# Patient Record
Sex: Female | Born: 1955
Health system: Southern US, Community
[De-identification: ages and names within clinical notes are randomized; demographics above are authoritative.]

## PROBLEM LIST (undated history)

## (undated) DIAGNOSIS — E119 Type 2 diabetes mellitus without complications: Secondary | ICD-10-CM

## (undated) DIAGNOSIS — I10 Essential (primary) hypertension: Secondary | ICD-10-CM

## (undated) DIAGNOSIS — E785 Hyperlipidemia, unspecified: Secondary | ICD-10-CM

## (undated) HISTORY — DX: Type 2 diabetes mellitus without complications: E11.9

## (undated) HISTORY — PX: OTHER SURGICAL HISTORY: SHX169

## (undated) HISTORY — DX: Hyperlipidemia, unspecified: E78.5

## (undated) HISTORY — PX: AUGMENTATION MAMMAPLASTY: SUR837

## (undated) HISTORY — DX: Essential (primary) hypertension: I10

## (undated) HISTORY — PX: BREAST ENHANCEMENT SURGERY: SHX7

## (undated) HISTORY — PX: TONSILECTOMY/ADENOIDECTOMY WITH MYRINGOTOMY: SHX6125

## (undated) HISTORY — PX: TUBAL LIGATION: SHX77

---

## 2011-11-30 LAB — BASIC METABOLIC PANEL
Creatinine: 0.7 mg/dL (ref <=1.1)
Potassium: 4.2 mmol/L (ref 3.4–5.3)
SODIUM: 138 mmol/L (ref 137–147)

## 2012-02-11 LAB — LIPID PANEL
CHOLESTEROL: 198 mg/dL (ref 0–200)
HDL: 67 mg/dL (ref 35–70)
LDL Cholesterol: 118 mg/dL
TRIGLYCERIDES: 67 mg/dL (ref 40–160)

## 2012-02-11 LAB — BASIC METABOLIC PANEL: Glucose: 112 mg/dL

## 2013-03-28 ENCOUNTER — Ambulatory Visit (INDEPENDENT_AMBULATORY_CARE_PROVIDER_SITE_OTHER): Payer: 59 | Admitting: Physician Assistant

## 2013-03-28 ENCOUNTER — Encounter: Payer: Self-pay | Admitting: Physician Assistant

## 2013-03-28 VITALS — BP 125/67 | HR 63 | Wt 169.0 lb

## 2013-03-28 DIAGNOSIS — R209 Unspecified disturbances of skin sensation: Secondary | ICD-10-CM

## 2013-03-28 DIAGNOSIS — E785 Hyperlipidemia, unspecified: Secondary | ICD-10-CM | POA: Insufficient documentation

## 2013-03-28 DIAGNOSIS — R238 Other skin changes: Secondary | ICD-10-CM

## 2013-03-28 DIAGNOSIS — E119 Type 2 diabetes mellitus without complications: Secondary | ICD-10-CM | POA: Insufficient documentation

## 2013-03-28 DIAGNOSIS — Z23 Encounter for immunization: Secondary | ICD-10-CM

## 2013-03-28 DIAGNOSIS — K449 Diaphragmatic hernia without obstruction or gangrene: Secondary | ICD-10-CM

## 2013-03-28 DIAGNOSIS — I10 Essential (primary) hypertension: Secondary | ICD-10-CM | POA: Insufficient documentation

## 2013-03-28 LAB — POCT UA - MICROALBUMIN
Creatinine, POC: 200 mg/dL
Microalbumin Ur, POC: 10 mg/L

## 2013-03-28 LAB — POCT GLYCOSYLATED HEMOGLOBIN (HGB A1C): Hemoglobin A1C: 5.6

## 2013-03-28 MED ORDER — OMEPRAZOLE 40 MG PO CPDR: 40.0000 mg | DELAYED_RELEASE_CAPSULE | Freq: Every day | ORAL | Status: DC

## 2013-03-28 MED ORDER — ATENOLOL-CHLORTHALIDONE 100-25 MG PO TABS: 1.0000 | ORAL_TABLET | Freq: Every day | ORAL | Status: DC

## 2013-03-28 MED ORDER — SITAGLIPTIN PHOSPHATE 50 MG PO TABS: 50.0000 mg | ORAL_TABLET | Freq: Every day | ORAL | Status: DC

## 2013-03-28 MED ORDER — METFORMIN HCL 1000 MG PO TABS: 1000.0000 mg | ORAL_TABLET | Freq: Two times a day (BID) | ORAL | Status: DC

## 2013-03-28 MED ORDER — PRAVASTATIN SODIUM 20 MG PO TABS: 20.0000 mg | ORAL_TABLET | Freq: Every day | ORAL | Status: DC

## 2013-03-28 NOTE — Patient Instructions (Addendum)
Will decrease Januvia to 50mg  daily.  Will refer to Gastroenterology.  Will refer to Opthamology.   Follow up in 3 months.   Raynaud's Syndrome Raynaud's Syndrome is a disorder of the blood vessels in your hands and feet. It occurs when small arteries of the arms/hands or legs/feet become sensitive to cold or emotional upset. This causes the arteries to constrict, or narrow, and reduces blood flow to the area. The color in the fingers or toes changes from white to bluish to red and this is not usually painful. There may be numbness and tingling. Sores on the skin (ulcers) can form. Symptoms are usually relieved by warming. HOME CARE INSTRUCTIONS   Avoid exposure to cold. Keep your whole body warm and dry. Dress in layers. Wear mittens or gloves when handling ice or frozen food and when outdoors. Use holders for glasses or cans containing cold drinks. If possible, stay indoors during cold weather.  Limit your use of caffeine. Switch to decaffeinated coffee, tea, and soda pop. Avoid chocolate.  Avoid smoking or being around cigarette smoke. Smoke will make symptoms worse.  Wear loose fitting socks and comfortable, roomy shoes.  Avoid vibrating tools and machinery.  If possible, avoid stressful and emotional situations. Exercise, meditation and yoga may help you cope with stress. Biofeedback may be useful.  Ask your caregiver about medicine (calcium channel blockers) that may control Raynaud's phenomena. SEEK MEDICAL CARE IF:   Your discomfort becomes worse, despite conservative treatment.  You develop sores on your fingers and toes that do not heal. Document Released: 03/05/2000 Document Revised: 05/31/2011 Document Reviewed: 03/12/2008 [facility] Patient Information 2014 [place], [place].

## 2013-03-28 NOTE — Progress Notes (Signed)
   Subjective:    Patient ID: Diane Brooks, female    DOB: 01-Oct-1955, 58 y.o.   MRN: [id number]  HPI Pt is a 58 yo female who presents to the clinic to establish care. Moved from [place].    .. Active Ambulatory Problems    Diagnosis Date Noted  . Hyperlipidemia 03/28/2013  . Hiatal hernia 03/28/2013  . Essential hypertension, benign 03/28/2013  . Diabetes mellitus type II, controlled 03/28/2013   Resolved Ambulatory Problems    Diagnosis Date Noted  . No Resolved Ambulatory Problems   Past Medical History  Diagnosis Date  . Diabetes mellitus without complication   . Hypertension    DM- taking metformin and januvia. Checking fasting glucose in am and between 95-120. Denies any hypoglycemia events. No problems or concerns.  HTN- taking medications regularly. No problems.   Cold feet- going on for 1-2 months. Feet get cold all of a sudden. They eventually resolve on there on. From [place] and used to cold weather. Pain is very sharp when get cold. Nothing seems to make better or worse.   Hiatal hernia- has regular endoscopys back in [place]. Would like GI to manage. Symptoms controlled with prilosec.    Review of Systems  All other systems reviewed and are negative.       Objective:   Physical Exam  Constitutional: She is oriented to person, place, and time. She appears well-developed and well-nourished.  HENT:  Head: Normocephalic and atraumatic.  Neck: Normal range of motion. Neck supple. No thyromegaly present.  Cardiovascular: Normal rate, regular rhythm and normal heart sounds.   Pulmonary/Chest: Effort normal and breath sounds normal. She has no wheezes.  Neurological: She is alert and oriented to person, place, and time.  Skin: Skin is warm and dry.  Psychiatric: She has a normal mood and affect. Her behavior is normal.          Assessment & Plan:  Diabetes mellitus type II-  A1C is 5.6. mircoalbumin was checked and normal. Discussed stopping [place]. Pt is not ready  to stop Tong[place] all together because she feels like it was what helped the most. Decreased januvia 50mg  daily with metformin BID. Continue regular exercise and maintain carbs/sugars with moderation. Discussed needs to be on ACE inhibitor. Not ready to add another medication. Will discuss at next visit. Referred to have eye doc appt. Will recheck in 3 months.   Diabetic foot exam:  Left: Reflexes 2+   Vibratory sensation normal  Proprioception normal  Sharp/dull discrimination normal  Filament test present Right: Reflexes 2+   Vibratory sensation normal  Proprioception diminished  Sharp/dull discrimination diminished  Filament test present   Hyperlipidemia- will recheck lipid today. Will refill according to levels.   Hiatal hernia/GERD- refilled prilosec. Will send to gastroenterology for further management of hiatal hernia.    HTN- refilled BP medications today. Will continue to monitor.   Cold feet- discussed possibility of raynauds. Not ready to start medication today. Gave HO for review. Discussed importance of keeping feet warm. Will continue to monitor.   Depression screening negative.   Needs CPE for evaluation and to go over health maintence.    Flu shot given without complications.

## 2013-03-29 LAB — CBC WITH DIFFERENTIAL/PLATELET
Basophils Absolute: 0 10*3/uL (ref 0.0–0.1)
Basophils Relative: 0 % (ref 0–1)
EOS ABS: 0.1 10*3/uL (ref 0.0–0.7)
EOS PCT: 2 % (ref 0–5)
HEMATOCRIT: 40.4 % (ref 36.0–46.0)
Hemoglobin: 14 g/dL (ref 12.0–15.0)
LYMPHS PCT: 47 % — AB (ref 12–46)
Lymphs Abs: 2.5 10*3/uL (ref 0.7–4.0)
MCH: 30.8 pg (ref 26.0–34.0)
MCHC: 34.7 g/dL (ref 30.0–36.0)
MCV: 89 fL (ref 78.0–100.0)
MONO ABS: 0.3 10*3/uL (ref 0.1–1.0)
Monocytes Relative: 6 % (ref 3–12)
Neutro Abs: 2.4 10*3/uL (ref 1.7–7.7)
Neutrophils Relative %: 45 % (ref 43–77)
PLATELETS: 248 10*3/uL (ref 150–400)
RBC: 4.54 MIL/uL (ref 3.87–5.11)
RDW: 14.6 % (ref 11.5–15.5)
WBC: 5.3 10*3/uL (ref 4.0–10.5)

## 2013-03-29 LAB — COMPLETE METABOLIC PANEL WITH GFR
ALBUMIN: 4.7 g/dL (ref 3.5–5.2)
ALK PHOS: 44 U/L (ref 39–117)
ALT: 18 U/L (ref 0–35)
AST: 22 U/L (ref 0–37)
BUN: 18 mg/dL (ref 6–23)
CALCIUM: 9.7 mg/dL (ref 8.4–10.5)
CO2: 33 mEq/L — ABNORMAL HIGH (ref 19–32)
Chloride: 100 mEq/L (ref 96–112)
Creat: 0.65 mg/dL (ref 0.50–1.10)
GFR, Est African American: >89 mL/min
GFR, Est Non African American: >89 mL/min
Glucose, Bld: 105 mg/dL — ABNORMAL HIGH (ref 70–99)
POTASSIUM: 4.1 meq/L (ref 3.5–5.3)
Sodium: 138 mEq/L (ref 135–145)
Total Bilirubin: 0.5 mg/dL (ref 0.3–1.2)
Total Protein: 7.2 g/dL (ref 6.0–8.3)

## 2013-03-29 LAB — LIPID PANEL
CHOL/HDL RATIO: 2.9 ratio
Cholesterol: 199 mg/dL (ref 0–200)
HDL: 68 mg/dL (ref >39)
LDL Cholesterol: 111 mg/dL — ABNORMAL HIGH (ref 0–99)
Triglycerides: 100 mg/dL (ref <150)
VLDL: 20 mg/dL (ref 0–40)

## 2013-04-13 ENCOUNTER — Encounter: Payer: Self-pay | Admitting: Internal Medicine

## 2013-04-16 ENCOUNTER — Other Ambulatory Visit: Payer: Self-pay | Admitting: Physician Assistant

## 2013-04-16 DIAGNOSIS — Z124 Encounter for screening for malignant neoplasm of cervix: Secondary | ICD-10-CM

## 2013-04-23 ENCOUNTER — Encounter: Payer: Self-pay | Admitting: *Deleted

## 2013-04-23 ENCOUNTER — Other Ambulatory Visit: Payer: Self-pay | Admitting: *Deleted

## 2013-04-23 MED ORDER — ASPIRIN 81 MG PO TABS: 81.0000 mg | ORAL_TABLET | Freq: Every day | ORAL | Status: DC

## 2013-04-23 MED ORDER — ATENOLOL-CHLORTHALIDONE 100-25 MG PO TABS: 1.0000 | ORAL_TABLET | Freq: Every day | ORAL | Status: DC

## 2013-04-23 MED ORDER — SITAGLIPTIN PHOSPHATE 50 MG PO TABS: 50.0000 mg | ORAL_TABLET | Freq: Every day | ORAL | Status: DC

## 2013-04-23 MED ORDER — PRAVASTATIN SODIUM 20 MG PO TABS: 20.0000 mg | ORAL_TABLET | Freq: Every day | ORAL | Status: DC

## 2013-04-23 MED ORDER — METFORMIN HCL 1000 MG PO TABS: 1000.0000 mg | ORAL_TABLET | Freq: Two times a day (BID) | ORAL | Status: DC

## 2013-04-23 MED ORDER — OMEPRAZOLE 40 MG PO CPDR: 40.0000 mg | DELAYED_RELEASE_CAPSULE | Freq: Every day | ORAL | Status: DC

## 2013-04-25 ENCOUNTER — Telehealth: Payer: Self-pay | Admitting: Physician Assistant

## 2013-04-25 NOTE — Telephone Encounter (Signed)
Call pt: received a letter from insurance company that stated they did not want to pay for Tonga. A1C is 5.6 which is great. I think a trial off medication is warranted at this time. If we see that A1C is climbing back up we will have better case for approval.

## 2013-04-26 NOTE — Telephone Encounter (Signed)
Spoke with pt & she is ok with doing a trial period without the Januvia.  I advised her to continue to keep a log of her blood sugars at home that she takes every other day & to let us know if they start trending above 120 & stay there.  Also advised her to follow up in 3 months to recheck her A1c off of the Januvia.  Pt voiced understanding.

## 2013-04-27 ENCOUNTER — Other Ambulatory Visit: Payer: Self-pay | Admitting: *Deleted

## 2013-04-27 MED ORDER — SITAGLIPTIN PHOSPHATE 50 MG PO TABS: 50.0000 mg | ORAL_TABLET | Freq: Every day | ORAL | Status: DC

## 2013-05-01 ENCOUNTER — Other Ambulatory Visit: Payer: Self-pay | Admitting: *Deleted

## 2013-05-01 MED ORDER — PRAVASTATIN SODIUM 40 MG PO TABS: 40.0000 mg | ORAL_TABLET | Freq: Every day | ORAL | Status: DC

## 2013-05-07 ENCOUNTER — Encounter: Payer: Self-pay | Admitting: Internal Medicine

## 2013-05-10 ENCOUNTER — Encounter: Payer: Self-pay | Admitting: Internal Medicine

## 2013-05-10 ENCOUNTER — Ambulatory Visit (INDEPENDENT_AMBULATORY_CARE_PROVIDER_SITE_OTHER): Payer: 59 | Admitting: Internal Medicine

## 2013-05-10 ENCOUNTER — Ambulatory Visit (INDEPENDENT_AMBULATORY_CARE_PROVIDER_SITE_OTHER): Payer: 59 | Admitting: Obstetrics & Gynecology

## 2013-05-10 ENCOUNTER — Encounter: Payer: Self-pay | Admitting: Obstetrics & Gynecology

## 2013-05-10 VITALS — BP 120/70 | HR 68 | Ht 62.5 in | Wt 173.4 lb

## 2013-05-10 VITALS — BP 121/65 | HR 56 | Resp 16 | Wt 171.0 lb

## 2013-05-10 DIAGNOSIS — R6882 Decreased libido: Secondary | ICD-10-CM

## 2013-05-10 DIAGNOSIS — Z01419 Encounter for gynecological examination (general) (routine) without abnormal findings: Secondary | ICD-10-CM

## 2013-05-10 DIAGNOSIS — F458 Other somatoform disorders: Secondary | ICD-10-CM

## 2013-05-10 DIAGNOSIS — K449 Diaphragmatic hernia without obstruction or gangrene: Secondary | ICD-10-CM

## 2013-05-10 DIAGNOSIS — Z1211 Encounter for screening for malignant neoplasm of colon: Secondary | ICD-10-CM

## 2013-05-10 DIAGNOSIS — R09A2 Foreign body sensation, throat: Secondary | ICD-10-CM

## 2013-05-10 DIAGNOSIS — K219 Gastro-esophageal reflux disease without esophagitis: Secondary | ICD-10-CM

## 2013-05-10 DIAGNOSIS — R0989 Other specified symptoms and signs involving the circulatory and respiratory systems: Secondary | ICD-10-CM | POA: Insufficient documentation

## 2013-05-10 DIAGNOSIS — Z1151 Encounter for screening for human papillomavirus (HPV): Secondary | ICD-10-CM

## 2013-05-10 DIAGNOSIS — R0789 Other chest pain: Secondary | ICD-10-CM

## 2013-05-10 DIAGNOSIS — K648 Other hemorrhoids: Secondary | ICD-10-CM | POA: Insufficient documentation

## 2013-05-10 DIAGNOSIS — Z124 Encounter for screening for malignant neoplasm of cervix: Secondary | ICD-10-CM

## 2013-05-10 MED ORDER — DEXLANSOPRAZOLE 60 MG PO CPDR: 60.0000 mg | DELAYED_RELEASE_CAPSULE | Freq: Every day | ORAL | Status: DC

## 2013-05-10 NOTE — Progress Notes (Signed)
  Subjective:    Diane Brooks is a 58 y.o. female who presents for an annual exam. The patient has no complaints today. The patient is sexually active. GYN screening history: last pap: was normal and last mammogram: was normal. The patient wears seatbelts: yes. The patient participates in regular exercise: yes. Has the patient ever been transfused or tattooed?: yes. The patient reports that there is not domestic violence in her life.  Her husband and her are not as close as they used to be.  She was the romantic one and when she stopped trying, things became worse.  He does not tell her "I love you".  They did not speak to each other over Valentines day (she was in [place])  Menstrual History: OB History   Grav Para Term Preterm Abortions TAB SAB Ect Mult Living   3 2   1  1   2        No LMP recorded. Patient is postmenopausal.    The following portions of the patient's history were reviewed and updated as appropriate: allergies, current medications, past family history, past medical history, past social history, past surgical history and problem list.  Review of Systems decreased libido, vaginal drynes    Objective:      Filed Vitals:   05/10/13 1344  BP: 121/65  Pulse: 56  Resp: 16  Weight: 171 lb (77.565 kg)   Vitals:  WNL General appearance: alert, cooperative and no distress Head: Normocephalic, without obvious abnormality, atraumatic Eyes: negative Throat: lips, mucosa, and tongue normal; teeth and gums normal Lungs: clear to auscultation bilaterally Breasts: s/p breast reduction.  Thre is a piece of sure visible under the skin.  It does not bother the patient. Heart: regular rate and rhythm Abdomen: soft, non-tender; bowel sounds normal; no masses,  no organomegaly  Pelvic:  External Genitalia:  Tanner V, no lesion Urethra Vagina:  Pale pink, atrohpic, no blood or discharge Cervix:  No CMT, no lesion Uterus:  Slightly enlarge with fibroids felt.  Pt knows she has  fibroids. Adnexa:  Normal, no masses, non tender Rectovaginal Septum:  Non tender, no masses  Extremities: no edema, redness or tenderness in the calves or thighs Skin: no lesions or rash Lymph nodes: Axillary adenopathy: none     .    Assessment:     Healthy female exam.  Decreased libido sue to relationship    Plan:     Breast self exam technique reviewed and patient encouraged to perform self-exam monthly. Mammogram. Pap smear.  Recommend marital counseling.

## 2013-05-10 NOTE — Patient Instructions (Signed)
We have sent the following medications to your pharmacy for you to pick up at your convenience: Garrison. We have given you samples to get started taking one tablet by mouth once daily.   Your follow up appt is scheduled with Dr. Hilarie Fredrickson on 06/13/13 at 9:00am.

## 2013-05-10 NOTE — Progress Notes (Addendum)
Patient ID: Diane Brooks, female   DOB: [birth year], 58 y.o.   MRN: [id number] HPI: [name] is a 58 year old female with a past medical history of GERD, hiatal hernia, internal hemorrhoids, hypertension, hyperlipidemia, and diabetes who is seen in consultation at the request of Micheal Likens, PA-C to evaluate her history of GERD and hiatal hernia. She is here today alone. She reports she has along history of GERD and atypical chest pain. This has been evaluated previously in [place], where she was living. She reports multiple previous upper endoscopies and also cardiology evaluation including stress testing. Her cardiology evaluation was reportedly normal. She is maintained on omeprazole 40 mg daily. She reports that she has a sensation of something stuck in her throat which worsens with stress. This has been going on for months to years. Occasionally she feels a "hot sensation" in her nose and throat. For the most part her heartburn is controlled but she does feel regurgitation and reports frequent throat clearing. She denies dysphagia or odynophagia. She does at times feel chest pressure which comes and goes. This is not exertional. No associated dyspnea and the pain does not radiate. No nausea or vomiting. Bowel solid does increase her regurgitation. No abdominal pain. Normal bowel movements. Stable weight. No blood in her stool or melena. She's had 3 or 4 previous colonoscopies the last in December 2013. No family history of GI malignancy  Past Medical History  Diagnosis Date  . Diabetes mellitus without complication   . Hypertension   . Hyperlipidemia     Past Surgical History  Procedure Laterality Date  . Tonsilectomy/adenoidectomy with myringotomy    . Breast enhancement surgery    . Tubal ligation    . Tummy tuck      Current Outpatient Prescriptions  Medication Sig Dispense Refill  . aspirin 81 MG tablet Take 1 tablet (81 mg total) by mouth daily.  30 tablet  3  .  atenolol-chlorthalidone (TENORETIC) 100-25 MG per tablet Take 1 tablet by mouth daily.  30 tablet  5  . metFORMIN (GLUCOPHAGE) 1000 MG tablet Take 1 tablet (1,000 mg total) by mouth 2 (two) times daily with a meal.  60 tablet  2  . pravastatin (PRAVACHOL) 40 MG tablet Take 1 tablet (40 mg total) by mouth daily.  30 tablet  1  . sitaGLIPtin (JANUVIA) 50 MG tablet Take 1 tablet (50 mg total) by mouth daily.  90 tablet  2  . dexlansoprazole (DEXILANT) 60 MG capsule Take 1 capsule (60 mg total) by mouth daily.  30 capsule  11   No current facility-administered medications for this visit.    No Known Allergies  Family History  Problem Relation Age of Onset  . Hyperlipidemia Mother   . Diabetes Father   . Hyperlipidemia Father   . Hypertension Father   . Diabetes Maternal Grandfather   . Hyperlipidemia Maternal Grandfather   . Diabetes Paternal Grandfather   . Hyperlipidemia Paternal Grandfather     History  Substance Use Topics  . Smoking status: Former Research scientist (life sciences)  . Smokeless tobacco: Never Used  . Alcohol Use: No    ROS: As per history of present illness, otherwise negative  BP 120/70  Pulse 68  Ht 5' 2.5" (1.588 m)  Wt 173 lb 6.4 oz (78.654 kg)  BMI 31.19 kg/m2 Constitutional: Well-developed and well-nourished. No distress. HEENT: Normocephalic and atraumatic. Oropharynx is clear and moist. No oropharyngeal exudate. Conjunctivae are normal.  No scleral icterus. Neck: Neck supple. Trachea midline. Cardiovascular:  Normal rate, regular rhythm and intact distal pulses. No M/R/G Pulmonary/chest: Effort normal and breath sounds normal. No wheezing, rales or rhonchi. Abdominal: Soft, nontender, nondistended. Bowel sounds active throughout.  Extremities: no clubbing, cyanosis, or edema Lymphadenopathy: No cervical adenopathy noted. Neurological: Alert and oriented to person place and time. Skin: Skin is warm and dry. No rashes noted. Psychiatric: Normal mood and affect. Behavior is  normal.  RELEVANT LABS AND IMAGING: CBC    Component Value Date/Time   WBC 5.3 03/28/2013 1139   RBC 4.54 03/28/2013 1139   HGB 14.0 03/28/2013 1139   HCT 40.4 03/28/2013 1139   PLT 248 03/28/2013 1139   MCV 89.0 03/28/2013 1139   MCH 30.8 03/28/2013 1139   MCHC 34.7 03/28/2013 1139   RDW 14.6 03/28/2013 1139   LYMPHSABS 2.5 03/28/2013 1139   MONOABS 0.3 03/28/2013 1139   EOSABS 0.1 03/28/2013 1139   BASOSABS 0.0 03/28/2013 1139    CMP     Component Value Date/Time   NA 138 03/28/2013 1139   K 4.1 03/28/2013 1139   CL 100 03/28/2013 1139   CO2 33* 03/28/2013 1139   GLUCOSE 105* 03/28/2013 1139   BUN 18 03/28/2013 1139   CREATININE 0.65 03/28/2013 1139   CALCIUM 9.7 03/28/2013 1139   PROT 7.2 03/28/2013 1139   ALBUMIN 4.7 03/28/2013 1139   AST 22 03/28/2013 1139   ALT 18 03/28/2013 1139   ALKPHOS 44 03/28/2013 1139   BILITOT 0.5 03/28/2013 1139   Records reviewed from previous GI doctor, Dr. Kern Alberta in for [facility], [place] EGD November 2011 -- hiatal hernia and mild gastritis EGD August 2010 --hiatal hernia and mild gastritis EGD October 2009 -- hiatal hernia, irregular Z line biopsied, gastritis  ---- Pathology records from the above no evidence for Barrett's, no evidence for metaplasia or H. Pylori  Colonoscopy November 2011 -- internal hemorrhoids Colonoscopy August 2010 -- internal hemorrhoids, poor prep on the right Colonoscopy October 2010 -- internal hemorrhoids   ASSESSMENT/PLAN: 57 year old female with a past medical history of GERD, hiatal hernia, internal hemorrhoids, hypertension, hyperlipidemia, and diabetes who is seen in consultation at the request of Micheal Likens, PA-C to evaluate her history of GERD and hiatal hernia.  1.  GERD with globus and atypical chest pain -- the differential for her current symptoms includes uncontrolled acid reflux, esophageal spasm or hypertensive contraction, or symptomatic hiatal hernia. She's had multiple upper endoscopies in the past which have revealed mild gastritis  without dysplasia, metaplasia or H. pylori and a "large" hiatal hernia. He has not had evidence of Barrett's esophagus. I do not feel that upper endoscopy would benefit her in any way at this time. I will switch her from omeprazole to Dexilant 60 mg daily to try to better control reflux. This may lead to improvement in her atypical chest pain/possible spasm and globus sensation. I will see her back in 4-6 weeks for followup. If symptoms continue my next recommendation would be for manometry. We discussed the plan and she understands and agrees. I recommended that she continue her typical GERD hygiene including not eating or drinking within 90 minutes of lying down, avoiding spicy foods and also trigger foods such as caffeine and alcohol.  2.  CRC screening -- she has had multiple previous colonoscopies. She is average risk from a CRC standpoint and has no family history of colon cancer. She has a history of internal hemorrhoids. I have requested records from her most recent colonoscopy in December 2013. If  this exam is similar to previous exams, meaning no polyps removed, she would be due repeat colonoscopy in December 2023.   Addendum: Records received from GI physician, Dr. Kern Alberta, [facility], [place] --Colonoscopy and upper endoscopy performed on 01/24/2010 --EGD hiatal hernia, mild gastritis, biopsies. Pathology = esophageal mucosa with mild esophagitis consistent with reflux esophagitis. No evidence of intestinal metaplasia or Barrett's esophagus. Antral biopsies nonspecific chronic inflammation. No evidence of metaplasia or active gastritis. Negative for H. Pylori. --Colonoscopy Exam to the cecum, internal hemorrhoids otherwise normal  Based on these findings would recommend repeat colonoscopy Nov 2021

## 2013-05-10 NOTE — Progress Notes (Deleted)
VULVAR BIOPSY NOTE  The indications for vulvar biopsy (rule out neoplasia, establish lichen sclerosus diagnosis) were reviewed.   Risks of the biopsy including pain, bleeding, infection, inadequate specimen, scarring and need for additional procedures  were discussed. The patient stated understanding and agreed to undergo procedure today. Consent was signed,  time out performed.  The patient's vulva was prepped with Betadine. 1% lidocaine was injected into the perineum, left side near introitus. A 3-mm punch biopsy was done, biopsy tissue was picked up with sterile forceps and sterile scissors were used to excise the lesion.  Small bleeding was noted and hemostasis was achieved using silver nitrate sticks.  The patient tolerated the procedure well. Post-procedure instructions  (pelvic rest for one week) were given to the patient. The patient is to call with heavy bleeding, fever greater than 100.4, foul smelling vaginal discharge or other concerns. The patient will be return to clinic in two weeks for discussion of results.  Pt looking for cheaper alternative to vagifem.  Will check mail order pharmacy and [facility].  

## 2013-05-16 ENCOUNTER — Telehealth: Payer: Self-pay | Admitting: Internal Medicine

## 2013-05-16 NOTE — Telephone Encounter (Signed)
Received 6 pages from Wilford Sports, MD., sent to Dr. Hilarie Fredrickson on 05/16/13/ss

## 2013-05-24 ENCOUNTER — Encounter: Payer: Self-pay | Admitting: Obstetrics & Gynecology

## 2013-05-24 DIAGNOSIS — N870 Mild cervical dysplasia: Secondary | ICD-10-CM | POA: Insufficient documentation

## 2013-05-24 DIAGNOSIS — B977 Papillomavirus as the cause of diseases classified elsewhere: Secondary | ICD-10-CM | POA: Insufficient documentation

## 2013-05-25 ENCOUNTER — Telehealth: Payer: Self-pay | Admitting: *Deleted

## 2013-05-25 DIAGNOSIS — Z1211 Encounter for screening for malignant neoplasm of colon: Secondary | ICD-10-CM | POA: Insufficient documentation

## 2013-05-25 NOTE — Telephone Encounter (Signed)
Pt notified of normal pap with HR HPV.  Pt states that she has never had an abnormal pap before and she was a virgin when she got married and has been married for 33 years.  Will send some information to pt in the mail.  There is a bit of a language barrier and not sure how much she truly understood.

## 2013-05-25 NOTE — Telephone Encounter (Signed)
Message copied by Asencion Islam on Fri May 25, 2013  9:03 AM ------      Message from: [name]      Created: Thu May 24, 2013  7:06 PM       Pt has HR HPV.  nml cytology.  Pease call pt.  Ask her if she has history of abnml paps.  If not, then co testing in 1 year.  If yes, obtain thorough history and let me know.  Thx Lora! ------

## 2013-06-07 ENCOUNTER — Other Ambulatory Visit: Payer: Self-pay | Admitting: Obstetrics & Gynecology

## 2013-06-07 DIAGNOSIS — Z1231 Encounter for screening mammogram for malignant neoplasm of breast: Secondary | ICD-10-CM

## 2013-06-12 ENCOUNTER — Encounter: Payer: Self-pay | Admitting: Internal Medicine

## 2013-06-13 ENCOUNTER — Encounter: Payer: Self-pay | Admitting: Internal Medicine

## 2013-06-13 ENCOUNTER — Ambulatory Visit (INDEPENDENT_AMBULATORY_CARE_PROVIDER_SITE_OTHER): Payer: 59 | Admitting: Internal Medicine

## 2013-06-13 VITALS — BP 112/68 | HR 68 | Ht 62.5 in | Wt 166.4 lb

## 2013-06-13 DIAGNOSIS — K449 Diaphragmatic hernia without obstruction or gangrene: Secondary | ICD-10-CM

## 2013-06-13 DIAGNOSIS — K219 Gastro-esophageal reflux disease without esophagitis: Secondary | ICD-10-CM

## 2013-06-13 DIAGNOSIS — R0789 Other chest pain: Secondary | ICD-10-CM

## 2013-06-13 MED ORDER — DEXLANSOPRAZOLE 60 MG PO CPDR: 60.0000 mg | DELAYED_RELEASE_CAPSULE | Freq: Every day | ORAL | Status: DC

## 2013-06-13 NOTE — Progress Notes (Signed)
Subjective:    Patient ID: Diane Brooks, female    DOB: Aug 10, 1955, 58 y.o.   MRN: [id number]  HPI Diane Brooks is a 57 year old female with a past medical history of GERD, hiatal hernia, internal hemorrhoids, hypertension, hyperlipidemia, and diabetes who is seen in followup. She is here alone today. Unfortunately she cannot afford the Dexilant consultation answered to 4 days of samples and then ran out. She doesn't feel like she can determine with a short period whether or not she had improvement in her symptoms, which I agree with. She reports she still feels hot sensation in the back of her throat. She's had some heartburn since changing PPI and going some days without. She does feel mid back pain with her GI symptoms. Not exertional. No associated dyspnea. No abdominal pain. Normal bowel movements. Stable weight. No blood in her stool or melena.  She asked about the possibility of surgical correction if this is symptomatic hiatal hernia.  Review of Systems As per HPI, otherwise negative  Current Medications, Allergies, Past Medical History, Past Surgical History, Family History and Social History were reviewed in [employer] record.     Objective:   Physical Exam BP 112/68  Pulse 68  Ht 5' 2.5" (1.588 m)  Wt 166 lb 6.4 oz (75.479 kg)  BMI 29.93 kg/m2 Constitutional: Well-developed and well-nourished. No distress. HEENT: Normocephalic and atraumatic. No scleral icterus. Neck: Neck supple. Trachea midline. Cardiovascular: Normal rate, regular rhythm and intact distal pulses. No M/R/G Pulmonary/chest: Effort normal and breath sounds normal. No wheezing, rales or rhonchi. Abdominal: Soft, nontender, nondistended. Bowel sounds active throughout.  Extremities: no clubbing, cyanosis, or edema Neurological: Alert and oriented to person place and time. Psychiatric: Normal mood and affect. Behavior is normal.  Records received from GI physician, Dr. Kern Alberta, [facility], [place]  --Colonoscopy and upper endoscopy performed on 01/24/2010  --EGD hiatal hernia, mild gastritis, biopsies. Pathology = esophageal mucosa with mild esophagitis consistent with reflux esophagitis. No evidence of intestinal metaplasia or Barrett's esophagus. Antral biopsies nonspecific chronic inflammation. No evidence of metaplasia or active gastritis. Negative for H. Pylori.  --Colonoscopy Exam to the cecum, internal hemorrhoids otherwise normal  CBC    Component Value Date/Time   WBC 5.3 03/28/2013 1139   RBC 4.54 03/28/2013 1139   HGB 14.0 03/28/2013 1139   HCT 40.4 03/28/2013 1139   PLT 248 03/28/2013 1139   MCV 89.0 03/28/2013 1139   MCH 30.8 03/28/2013 1139   MCHC 34.7 03/28/2013 1139   RDW 14.6 03/28/2013 1139   LYMPHSABS 2.5 03/28/2013 1139   MONOABS 0.3 03/28/2013 1139   EOSABS 0.1 03/28/2013 1139   BASOSABS 0.0 03/28/2013 1139    CMP     Component Value Date/Time   NA 138 03/28/2013 1139   K 4.1 03/28/2013 1139   CL 100 03/28/2013 1139   CO2 33* 03/28/2013 1139   GLUCOSE 105* 03/28/2013 1139   BUN 18 03/28/2013 1139   CREATININE 0.65 03/28/2013 1139   CALCIUM 9.7 03/28/2013 1139   PROT 7.2 03/28/2013 1139   ALBUMIN 4.7 03/28/2013 1139   AST 22 03/28/2013 1139   ALT 18 03/28/2013 1139   ALKPHOS 44 03/28/2013 1139   BILITOT 0.5 03/28/2013 1139       Assessment & Plan:   58 year old female with a past medical history of GERD, hiatal hernia, internal hemorrhoids, hypertension, hyperlipidemia, and diabetes who is seen in followup.  1. GERD with globus and atypical CP -- unfortunately we  do not know if a more potent PPI, Dexilant, would improve percent times because the medication was too expensive. She petition her insurance company and they approved a one-month supply. She will start this soon. In the meantime I recommended proceeding to barium swallow with tablet to further evaluate motility and also assess the size of her hiatal hernia. She has had previous endoscopy with negative biopsies for  eosinophilic esophagitis and no evidence for Barrett's esophagus. Given the stability of her symptoms upper endoscopy is felt unlikely to provide much additional benefit. I have recommended proceeding to esophageal manometry to ensure motility is normal, especially if she proceeds to Nissen fundoplication. I will see her back after the adequate trial of Dexilant and the 2 previously mentioned studies. She is happy with this plan  2.  CRC screening -- she is up-to-date and repeat colonoscopy recommended Nov 2021 for screening

## 2013-06-13 NOTE — Patient Instructions (Signed)
We have sent the following medications to your pharmacy for you to pick up at your convenience: [place] have been scheduled for a Barium Esophogram at [facility]iology (1st floor of the hospital) on 06/19/2013 at 9:30am. Please arrive 15 minutes prior to your appointment for registration. Make certain not to have anything to eat or drink 6 hours prior to your test. If you need to reschedule for any reason, please contact radiology at [phone] to do so. __________________________________________________________________ A barium swallow is an examination that concentrates on views of the esophagus. This tends to be a double contrast exam (barium and two liquids which, when combined, create a gas to distend the wall of the oesophagus) or single contrast (non-ionic iodine based). The study is usually tailored to your symptoms so a good history is essential. Attention is paid during the study to the form, structure and configuration of the esophagus, looking for functional disorders (such as aspiration, dysphagia, achalasia, motility and reflux) EXAMINATION You may be asked to change into a gown, depending on the type of swallow being performed. A radiologist and radiographer will perform the procedure. The radiologist will advise you of the type of contrast selected for your procedure and direct you during the exam. You will be asked to stand, sit or lie in several different positions and to hold a small amount of fluid in your mouth before being asked to swallow while the imaging is performed .In some instances you may be asked to swallow barium coated marshmallows to assess the motility of a solid food bolus. The exam can be recorded as a digital or video fluoroscopy procedure. POST PROCEDURE It will take 1-2 days for the barium to pass through your system. To facilitate this, it is important, unless otherwise directed, to increase your fluids for the next 24-48hrs and to resume your normal  diet.  This test typically takes about 30 minutes to perform. __________________________________________________________________________________   Diane Brooks have been scheduled for an esophageal manometry at [facility] on July 02, 2013 at 7:45am. Please arrive 30 minutes prior to your procedure for registration. You will need to go to outpatient registration (1st floor of the hospital) first. Make certain to bring your insurance cards as well as a complete list of medications.  Please remember the following:  1) Nothing to eat or drink after 12:00 midnight on the night before your test.  2) Hold all diabetic medications/insulin the morning of the test. You may eat and take your medications after the test.  3) For 3 days prior to your test do not take: Dexilant, Prevacid, Nexium, Protonix, Aciphex, Zegerid, Pantoprazole, Prilosec or omeprazole.  4) For 2 days prior to your test, do not take: Reglan, Tagamet, Zantac, Axid or Pepcid.  5) You MAY use an antacid such as Rolaids or Tums up to 12 hours prior to your test.  It will take at least 2 weeks to receive the results of this test from your physician. ------------------------------------------ ABOUT ESOPHAGEAL MANOMETRY Esophageal manometry (muh-NOM-uh-tree) is a test that gauges how well your esophagus works. Your esophagus is the long, muscular tube that connects your throat to your stomach. Esophageal manometry measures the rhythmic muscle contractions (peristalsis) that occur in your esophagus when you swallow. Esophageal manometry also measures the coordination and force exerted by the muscles of your esophagus.  During esophageal manometry, a thin, flexible tube (catheter) that contains sensors is passed through your nose, down your esophagus and into your stomach. Esophageal manometry can be  helpful in diagnosing some mostly uncommon disorders that affect your esophagus.  Why it's done Esophageal manometry is used to evaluate  the movement (motility) of food through the esophagus and into the stomach. The test measures how well the circular bands of muscle (sphincters) at the top and bottom of your esophagus open and close, as well as the pressure, strength and pattern of the wave of esophageal muscle contractions that moves food along.  What you can expect Esophageal manometry is an outpatient procedure done without sedation. Most people tolerate it well. You may be asked to change into a hospital gown before the test starts.  During esophageal manometry  While you are sitting up, a member of your health care team sprays your throat with a numbing medication or puts numbing gel in your nose or both.  A catheter is guided through your nose into your esophagus. The catheter may be sheathed in a water-filled sleeve. It doesn't interfere with your breathing. However, your eyes may water, and you may gag. You may have a slight nosebleed from irritation.  After the catheter is in place, you may be asked to lie on your back on an exam table, or you may be asked to remain seated.  You then swallow small sips of water. As you do, a computer connected to the catheter records the pressure, strength and pattern of your esophageal muscle contractions.  During the test, you'll be asked to breathe slowly and smoothly, remain as still as possible, and swallow only when you're asked to do so.  A member of your health care team may move the catheter down into your stomach while the catheter continues its measurements.  The catheter then is slowly withdrawn. The test usually lasts 20 to 30 minutes.  After esophageal manometry  When your esophageal manometry is complete, you may return to your normal activities  This test typically takes 30-45 minutes to complete. ________________________________________________________________________________  Follow up with Dr. Hilarie Fredrickson in May

## 2013-06-19 ENCOUNTER — Encounter: Payer: Self-pay | Admitting: Internal Medicine

## 2013-06-19 ENCOUNTER — Ambulatory Visit
Admission: RE | Admit: 2013-06-19 | Discharge: 2013-06-19 | Disposition: A | Discharge status: Home / Self Care | Payer: 59 | Source: Ambulatory Visit | Attending: Obstetrics & Gynecology | Admitting: Obstetrics & Gynecology

## 2013-06-19 ENCOUNTER — Ambulatory Visit (HOSPITAL_COMMUNITY)
Admission: RE | Admit: 2013-06-19 | Discharge: 2013-06-19 | Disposition: A | Discharge status: Home / Self Care | Payer: 59 | Source: Ambulatory Visit | Attending: Internal Medicine | Admitting: Internal Medicine

## 2013-06-19 DIAGNOSIS — Z1231 Encounter for screening mammogram for malignant neoplasm of breast: Secondary | ICD-10-CM

## 2013-06-19 DIAGNOSIS — K219 Gastro-esophageal reflux disease without esophagitis: Secondary | ICD-10-CM

## 2013-06-19 DIAGNOSIS — R0789 Other chest pain: Secondary | ICD-10-CM

## 2013-06-19 DIAGNOSIS — R079 Chest pain, unspecified: Secondary | ICD-10-CM | POA: Insufficient documentation

## 2013-06-19 DIAGNOSIS — K449 Diaphragmatic hernia without obstruction or gangrene: Secondary | ICD-10-CM

## 2013-06-19 DIAGNOSIS — R1310 Dysphagia, unspecified: Secondary | ICD-10-CM | POA: Insufficient documentation

## 2013-06-27 ENCOUNTER — Encounter: Payer: Self-pay | Admitting: Physician Assistant

## 2013-06-27 ENCOUNTER — Ambulatory Visit (INDEPENDENT_AMBULATORY_CARE_PROVIDER_SITE_OTHER): Payer: 59 | Admitting: Physician Assistant

## 2013-06-27 ENCOUNTER — Other Ambulatory Visit: Payer: Self-pay | Admitting: *Deleted

## 2013-06-27 VITALS — BP 125/75 | HR 65 | Ht 62.5 in | Wt 170.0 lb

## 2013-06-27 DIAGNOSIS — E119 Type 2 diabetes mellitus without complications: Secondary | ICD-10-CM

## 2013-06-27 DIAGNOSIS — R809 Proteinuria, unspecified: Secondary | ICD-10-CM

## 2013-06-27 DIAGNOSIS — H02829 Cysts of unspecified eye, unspecified eyelid: Secondary | ICD-10-CM | POA: Insufficient documentation

## 2013-06-27 DIAGNOSIS — E669 Obesity, unspecified: Secondary | ICD-10-CM

## 2013-06-27 DIAGNOSIS — E1129 Type 2 diabetes mellitus with other diabetic kidney complication: Secondary | ICD-10-CM | POA: Insufficient documentation

## 2013-06-27 DIAGNOSIS — R635 Abnormal weight gain: Secondary | ICD-10-CM

## 2013-06-27 DIAGNOSIS — E785 Hyperlipidemia, unspecified: Secondary | ICD-10-CM | POA: Insufficient documentation

## 2013-06-27 DIAGNOSIS — B351 Tinea unguium: Secondary | ICD-10-CM | POA: Insufficient documentation

## 2013-06-27 DIAGNOSIS — I10 Essential (primary) hypertension: Secondary | ICD-10-CM

## 2013-06-27 LAB — POCT GLYCOSYLATED HEMOGLOBIN (HGB A1C): Hemoglobin A1C: 5.9

## 2013-06-27 MED ORDER — TERBINAFINE HCL 250 MG PO TABS: 250.0000 mg | ORAL_TABLET | Freq: Every day | ORAL | Status: DC

## 2013-06-27 MED ORDER — PRAVASTATIN SODIUM 40 MG PO TABS: 40.0000 mg | ORAL_TABLET | Freq: Every day | ORAL | Status: DC

## 2013-06-27 MED ORDER — SAXAGLIPTIN HCL 2.5 MG PO TABS: 2.5000 mg | ORAL_TABLET | Freq: Every day | ORAL | Status: DC

## 2013-06-27 MED ORDER — SITAGLIPTIN PHOSPHATE 50 MG PO TABS: 50.0000 mg | ORAL_TABLET | Freq: Every day | ORAL | Status: DC

## 2013-06-27 MED ORDER — METFORMIN HCL 1000 MG PO TABS: 1000.0000 mg | ORAL_TABLET | Freq: Two times a day (BID) | ORAL | Status: DC

## 2013-06-27 MED ORDER — ATENOLOL-CHLORTHALIDONE 100-25 MG PO TABS: 1.0000 | ORAL_TABLET | Freq: Every day | ORAL | Status: DC

## 2013-06-27 MED ORDER — PHENTERMINE HCL 37.5 MG PO TABS: 37.5000 mg | ORAL_TABLET | Freq: Every day | ORAL | Status: DC

## 2013-06-27 NOTE — Patient Instructions (Addendum)
Start phentermine for appetite suppressant. Will refer to nutritionist.  Warm compresses on cyst.   lamsil for 12 weeks once a day for toenail fungus.   Onychomycosis/Fungal Toenails  WHAT IS IT? An infection that lies within the keratin of your nail plate that is caused by a fungus.  WHY ME? Fungal infections affect all ages, sexes, races, and creeds.  There may be many factors that predispose you to a fungal infection such as age, coexisting medical conditions such as diabetes, or an autoimmune disease; stress, medications, fatigue, genetics, etc.  Bottom line: fungus thrives in a warm, moist environment and your shoes offer such a location.  IS IT CONTAGIOUS? Theoretically, yes.  You do not want to share shoes, nail clippers or files with someone who has fungal toenails.  Walking around barefoot in the same room or sleeping in the same bed is unlikely to transfer the organism.  It is important to realize, however, that fungus can spread easily from one nail to the next on the same foot.  HOW DO WE TREAT THIS?  There are several ways to treat this condition.  Treatment may depend on many factors such as age, medications, pregnancy, liver and kidney conditions, etc.  It is best to ask your doctor which options are available to you.  1. No treatment.   Unlike many other medical concerns, you can live with this condition.  However for many people this can be a painful condition and may lead to ingrown toenails or a bacterial infection.  It is recommended that you keep the nails cut short to help reduce the amount of fungal nail. 2. Topical treatment.  These range from herbal remedies to prescription strength nail lacquers.  About 40-50% effective, topicals require twice daily application for approximately 9 to 12 months or until an entirely new nail has grown out.  The most effective topicals are medical grade medications available through physicians offices. 3. Oral antifungal medications.  With an  80-90% cure rate, the most common oral medication requires 3 to 4 months of therapy and stays in your system for a year as the new nail grows out.  Oral antifungal medications do require blood work to make sure it is a safe drug for you.  A liver function panel will be performed prior to starting the medication and after the first month of treatment.  It is important to have the blood work performed to avoid any harmful side effects.  In general, this medication safe but blood work is required. 4. Laser Therapy.  This treatment is performed by applying a specialized laser to the affected nail plate.  This therapy is noninvasive, fast, and non-painful.  It is not covered by insurance and is therefore, out of pocket.  The results have been very good with a 80-95% cure rate.  The [facility] is the only practice in the area to offer this therapy. 5. Permanent Nail Avulsion.  Removing the entire nail so that a new nail will not grow back.

## 2013-06-27 NOTE — Progress Notes (Signed)
Subjective:    Patient ID: Diane Brooks, female    DOB: April 04, 1955, 58 y.o.   MRN: [id number]  HPI Patient is a 58 year old female who presents to the clinic for diabetes followup and other concerns.   DM-patient tries to maintain a healthy diet and regular exercise. She is very frustrated that she's not able to lose weight. She does not check her sugars regularly. She continues to stay on metformin twice a day as well as Januvia 50 mg once a day. We recently her [place] back to once a day. She denies any hypoglycemic events. She denies any neuropathy, vision changes, sweats, shakiness. Her interest currently will not pay for Januvia and will like to discuss another option.   Hypertension-patient is taking medication daily with no complaints or concerns. Patient denies any chest pains, palpitations, headaches or vision changes.  Hyperlipidemia-we'll increase Pravachol at last visit. Will check lipid profile in the next 3 months.  Patient has thickened bilateral great toes. She has noticed this for the last month. She's not had anything to make better. Seems to be getting worse as she waits. She did start working on Epogen the last couple months.  Patient also has a lesion on her right lower eyelid. It is not painful but she seems to be getting bigger and bothering her. Sometimes feels like it is in her vision. She's not tried anything to make better. No drainage or swelling.     Review of Systems  All other systems reviewed and are negative.      Objective:   Physical Exam  Constitutional: She is oriented to person, place, and time. She appears well-developed and well-nourished.  Obese  HENT:  Head: Normocephalic and atraumatic.  Eyes:    Cardiovascular: Normal rate, regular rhythm and normal heart sounds.   Pulmonary/Chest: Effort normal and breath sounds normal. She has no wheezes.  Neurological: She is alert and oriented to person, place, and time.  Skin: Skin is dry.    Bilateral great toenails thick nails with yellow tint.  Psychiatric: She has a normal mood and affect. Her behavior is normal.          Assessment & Plan:  Diabetes mellitus, controlled-A1c is 5.9. Insurance will not pay for Januvia without trial of onyglza. stop Januvia at this visit and start new medication. Discuss going off this class of medication completely but she feels better being on it. I do not think she necessarily needs it but will stay on it since she prefers this treatment. We'll recheck A1c in 3 months. Continue on metformin 1000 mg twice a day. Continue healthy balanced diet and regular exercise. Discuss need for up-to-date eye exam. Nutrition referral made.   Obesity/abnormal weight gain-patient is having increased difficulty losing weight. We'll check thyroid. Will also start phentermine since blood pressure and pulse our great today. Discussed with patient side effects of a stimulant. She is aware she has any palpitations increased blood pressure or insomnia to stop medication and call office. Patient aware of any continually diet and exercise are important and overall weight loss. Follow up in 1 month for recheck.   HTN-refilled Tenoretic today for 6 months.  Hyperlipidemia-refilled Pravachol for 6 months.  Cyst of right lower eyelid-discuss likely benign finding. Did not appear to be infected. Suggested patient to apply warm compresses on double times a day and perhaps would pop on its own. Will make referral to ophthalmologist for evaluation. At that time she can also have her eyes evaluated.  onychomycosis bilateral great toes. Started Lamisil once daily for 12 weeks. Gave patient handout on no fungus. Discussed with patient how this will take time for the toenail to grow out.

## 2013-06-28 ENCOUNTER — Other Ambulatory Visit: Payer: Self-pay | Admitting: Physician Assistant

## 2013-06-28 LAB — T4, FREE: Free T4: 1.33 ng/dL (ref 0.80–1.80)

## 2013-06-28 LAB — TSH: TSH: 1.215 u[IU]/mL (ref 0.350–4.500)

## 2013-07-02 ENCOUNTER — Encounter (HOSPITAL_COMMUNITY)
Admission: RE | Disposition: A | Discharge status: Home / Self Care | Payer: Self-pay | Source: Ambulatory Visit | Attending: Internal Medicine

## 2013-07-02 ENCOUNTER — Ambulatory Visit (HOSPITAL_COMMUNITY)
Admission: RE | Admit: 2013-07-02 | Discharge: 2013-07-02 | Disposition: A | Discharge status: Home / Self Care | Payer: 59 | Source: Ambulatory Visit | Attending: Internal Medicine | Admitting: Internal Medicine

## 2013-07-02 DIAGNOSIS — K228 Other specified diseases of esophagus: Secondary | ICD-10-CM

## 2013-07-02 DIAGNOSIS — K2289 Other specified disease of esophagus: Secondary | ICD-10-CM

## 2013-07-02 DIAGNOSIS — K224 Dyskinesia of esophagus: Secondary | ICD-10-CM

## 2013-07-02 DIAGNOSIS — K219 Gastro-esophageal reflux disease without esophagitis: Secondary | ICD-10-CM | POA: Insufficient documentation

## 2013-07-02 DIAGNOSIS — R0789 Other chest pain: Secondary | ICD-10-CM | POA: Insufficient documentation

## 2013-07-02 HISTORY — PX: ESOPHAGEAL MANOMETRY: SHX5429

## 2013-07-02 SURGERY — MANOMETRY, ESOPHAGUS

## 2013-07-02 MED ORDER — LIDOCAINE VISCOUS 2 % MT SOLN
OROMUCOSAL | Status: AC
  Filled 2013-07-02: qty 15

## 2013-07-02 SURGICAL SUPPLY — 1 items: FACESHIELD LNG OPTICON STERILE (SAFETY) IMPLANT

## 2013-07-03 ENCOUNTER — Encounter (HOSPITAL_COMMUNITY): Payer: Self-pay | Admitting: Internal Medicine

## 2013-07-04 DIAGNOSIS — K224 Dyskinesia of esophagus: Secondary | ICD-10-CM

## 2013-07-05 ENCOUNTER — Encounter: Payer: Self-pay | Admitting: Family Medicine

## 2013-07-05 ENCOUNTER — Ambulatory Visit (INDEPENDENT_AMBULATORY_CARE_PROVIDER_SITE_OTHER): Payer: 59

## 2013-07-05 ENCOUNTER — Ambulatory Visit (INDEPENDENT_AMBULATORY_CARE_PROVIDER_SITE_OTHER): Payer: 59 | Admitting: Family Medicine

## 2013-07-05 VITALS — BP 105/66 | HR 71 | Temp 98.1°F | Ht 62.5 in | Wt 163.0 lb

## 2013-07-05 DIAGNOSIS — M546 Pain in thoracic spine: Secondary | ICD-10-CM

## 2013-07-05 DIAGNOSIS — R1013 Epigastric pain: Secondary | ICD-10-CM

## 2013-07-05 DIAGNOSIS — R0789 Other chest pain: Secondary | ICD-10-CM

## 2013-07-05 DIAGNOSIS — R1032 Left lower quadrant pain: Secondary | ICD-10-CM

## 2013-07-05 DIAGNOSIS — E119 Type 2 diabetes mellitus without complications: Secondary | ICD-10-CM

## 2013-07-05 DIAGNOSIS — K209 Esophagitis, unspecified without bleeding: Secondary | ICD-10-CM

## 2013-07-05 DIAGNOSIS — R079 Chest pain, unspecified: Secondary | ICD-10-CM

## 2013-07-05 DIAGNOSIS — K5900 Constipation, unspecified: Secondary | ICD-10-CM

## 2013-07-05 MED ORDER — SAXAGLIPTIN HCL 5 MG PO TABS: 5.0000 mg | ORAL_TABLET | Freq: Every day | ORAL | Status: DC

## 2013-07-05 MED ORDER — PROMETHAZINE HCL 25 MG/ML IJ SOLN
12.5000 mg | Freq: Once | INTRAMUSCULAR | Status: AC
  Administered 2013-07-05: 12.5 mg via INTRAMUSCULAR

## 2013-07-05 MED ORDER — KETOROLAC TROMETHAMINE 60 MG/2ML IM SOLN
60.0000 mg | Freq: Once | INTRAMUSCULAR | Status: AC
  Administered 2013-07-05: 60 mg via INTRAMUSCULAR

## 2013-07-05 MED ORDER — GI COCKTAIL ~~LOC~~
30.0000 mL | Freq: Once | ORAL | Status: AC
  Administered 2013-07-05: 30 mL via ORAL

## 2013-07-05 NOTE — Patient Instructions (Signed)
Recommend start over-the-counter MiraLax. If the powder that he makes a glass of water to take. Recommend start with twice a day. This will help you move your bowels more normally. We will call with your blood work once available. Recommend start dexilant once a day. Take first dose as soon as possible and then once a day until samples ran out.

## 2013-07-05 NOTE — Progress Notes (Signed)
Subjective:    Patient ID: Diane Brooks, female    DOB: Oct 23, 1955, 58 y.o.   MRN: [id number]  HPI Abd pain x 3-4 days radiating up into chest and back. Lot of rumbling with only tiny hard pellet BM.  Last normal BM was Sunday.  Bloating and gas. Tried Mylanta w/ no relief. Severe GERD.   Hx of Hiatal hernia. Yesterday has mucous in her stool.   Throat burns and into her chest. Says she feels like it is on "fire".  No fever,  Chills or sweats. [place] Poli had some pain going down to hr right forearm and a couple of fingers felt numb. + nausae. No vomiting.   Hx of bulging disc in her upper back and has been having pain and tenderness. Just slighly to the left of the spine.  Says that feels like that has flared up again.  Very tender. Tried to get her husband to massage it last night and say she almost "passed out"   Diabetes-she was recently switched from [place] to [place] because of insurance purposes. She's noticed that her glucose has been up about 10-20 points compared to when she was on Januvia.  Review of Systems  BP 105/66  Pulse 71  Temp(Src) 98.1 F (36.7 C) (Oral)  Ht 5' 2.5" (1.588 m)  Wt 163 lb (73.936 kg)  BMI 29.32 kg/m2  SpO2 97%    No Known Allergies  Past Medical History  Diagnosis Date  . Diabetes mellitus without complication   . Hypertension   . Hyperlipidemia     Past Surgical History  Procedure Laterality Date  . Tonsilectomy/adenoidectomy with myringotomy    . Breast enhancement surgery    . Tubal ligation    . Tummy tuck    . Esophageal manometry N/A 07/02/2013    Procedure: ESOPHAGEAL MANOMETRY (EM);  Surgeon: Jerene Bears, MD;  Location: [facility];  Service: Gastroenterology;  Laterality: N/A;    History   Social History  . Marital Status: Married    Spouse Name: N/A    Number of Children: 2  . Years of Education: N/A   Occupational History  . Retired    Social History Main Topics  . Smoking status: Former Research scientist (life sciences)  . Smokeless tobacco:  Never Used  . Alcohol Use: No  . Drug Use: No  . Sexual Activity: Yes    Birth Control/ Protection: Surgical   Other Topics Concern  . Not on file   Social History Narrative  . No narrative on file    Family History  Problem Relation Age of Onset  . Hyperlipidemia Mother   . Diabetes Father   . Hyperlipidemia Father   . Hypertension Father   . Diabetes Maternal Grandfather   . Hyperlipidemia Maternal Grandfather   . Diabetes Paternal Grandfather   . Hyperlipidemia Paternal Grandfather     Outpatient Encounter Prescriptions as of 07/05/2013  Medication Sig  . aspirin 81 MG tablet Take 1 tablet (81 mg total) by mouth daily.  [name] Kitchen atenolol-chlorthalidone (TENORETIC) 100-25 MG per tablet Take 1 tablet by mouth daily.  [name] Kitchen dexlansoprazole (DEXILANT) 60 MG capsule Take 1 capsule (60 mg total) by mouth daily.  . metFORMIN (GLUCOPHAGE) 1000 MG tablet Take 1 tablet (1,000 mg total) by mouth 2 (two) times daily with a meal.  . phentermine (ADIPEX-P) 37.5 MG tablet Take 1 tablet (37.5 mg total) by mouth daily before breakfast.  . pravastatin (PRAVACHOL) 40 MG tablet Take 1 tablet (40 mg total) by mouth  daily.  . terbinafine (LAMISIL) 250 MG tablet Take 1 tablet (250 mg total) by mouth daily.  . [DISCONTINUED] saxagliptin HCl (ONGLYZA) 2.5 MG TABS tablet Take 1 tablet (2.5 mg total) by mouth daily.  . saxagliptin HCl (ONGLYZA) 5 MG TABS tablet Take 1 tablet (5 mg total) by mouth daily.  . [DISCONTINUED] pravastatin (PRAVACHOL) 20 MG tablet TAKE 1 TABLET (20 MG TOTAL) BY MOUTH DAILY.          Objective:   Physical Exam  Constitutional: She is oriented to person, place, and time. She appears well-developed and well-nourished.  HENT:  Head: Normocephalic and atraumatic.  Mouth/Throat: Oropharynx is clear and moist.  Some cobblestoning of the posterior pharynx  Eyes: Conjunctivae are normal. Pupils are equal, round, and reactive to light.  Neck: Neck supple. No thyromegaly present.   Cardiovascular: Normal rate, regular rhythm and normal heart sounds.   Pulmonary/Chest: Effort normal and breath sounds normal.  Abdominal: Soft. Bowel sounds are normal. She exhibits no distension and no mass. There is tenderness. There is no rebound and no guarding.  Tender in the epigastric area, left lateral abdominal wall and left lower quadrant. Bowel sounds are hyperactive.  Musculoskeletal: She exhibits no edema.  Lymphadenopathy:    She has no cervical adenopathy.  Neurological: She is alert and oriented to person, place, and time.  Skin: Skin is warm and dry.  Psychiatric: She has a normal mood and affect. Her behavior is normal.          Assessment & Plan:  ABd Pain - unclear etiology. Could be gastritis versus esophagitis versus related to her constipation. We'll attempt to treat all of these things to get her better. We'll go ahead and start a proton pump inhibitor. We have samples of dexilant will start her on that. I'm going to also send her to the lab for CBC to evaluate for infection. Because her pain does radiate up into the chest I think it's appropriate to also get cardiac enzymes. Her EKG was normal which is reassuring. Will check for pancreatitis.    Atypical chest pain-  EKG shows rate of 63 bpm, NSR, no acute changes. Also check cardiac enzymes and TSH and CBC.   Constipation- Start miralax for constipation. Increase water intake.   Upper thoracic back pain - will treat with Toradol injection. Avoid NSAID since has so much GI inflamation at this time.    Diabetes-Will increase the glyburide 5 mg and see if this gets better control. Keep regularly scheduled followup for diabetes with her primary care provider.

## 2013-07-06 ENCOUNTER — Encounter: Payer: Self-pay | Admitting: *Deleted

## 2013-07-06 LAB — CBC WITH DIFFERENTIAL/PLATELET
BASOS ABS: 0 10*3/uL (ref 0.0–0.1)
BASOS PCT: 0 % (ref 0–1)
EOS ABS: 0.1 10*3/uL (ref 0.0–0.7)
Eosinophils Relative: 2 % (ref 0–5)
HCT: 42.4 % (ref 36.0–46.0)
Hemoglobin: 14.2 g/dL (ref 12.0–15.0)
Lymphocytes Relative: 37 % (ref 12–46)
Lymphs Abs: 2.1 10*3/uL (ref 0.7–4.0)
MCH: 30.1 pg (ref 26.0–34.0)
MCHC: 33.5 g/dL (ref 30.0–36.0)
MCV: 90 fL (ref 78.0–100.0)
MONOS PCT: 5 % (ref 3–12)
Monocytes Absolute: 0.3 10*3/uL (ref 0.1–1.0)
NEUTROS PCT: 56 % (ref 43–77)
Neutro Abs: 3.1 10*3/uL (ref 1.7–7.7)
PLATELETS: 267 10*3/uL (ref 150–400)
RBC: 4.71 MIL/uL (ref 3.87–5.11)
RDW: 13.5 % (ref 11.5–15.5)
WBC: 5.6 10*3/uL (ref 4.0–10.5)

## 2013-07-06 LAB — COMPLETE METABOLIC PANEL WITH GFR
ALK PHOS: 45 U/L (ref 39–117)
ALT: 15 U/L (ref 0–35)
AST: 18 U/L (ref 0–37)
Albumin: 4.7 g/dL (ref 3.5–5.2)
BILIRUBIN TOTAL: 0.4 mg/dL (ref 0.2–1.2)
BUN: 21 mg/dL (ref 6–23)
CO2: 31 mEq/L (ref 19–32)
Calcium: 9.8 mg/dL (ref 8.4–10.5)
Chloride: 98 mEq/L (ref 96–112)
Creat: 0.72 mg/dL (ref 0.50–1.10)
GFR, Est African American: >89 mL/min
GFR, Est Non African American: >89 mL/min
GLUCOSE: 94 mg/dL (ref 70–99)
Potassium: 4.2 mEq/L (ref 3.5–5.3)
SODIUM: 139 meq/L (ref 135–145)
TOTAL PROTEIN: 7.6 g/dL (ref 6.0–8.3)

## 2013-07-06 LAB — CK TOTAL AND CKMB (NOT AT ARMC)
CK, MB: 2.1 ng/mL (ref 0.0–5.0)
RELATIVE INDEX: 1.3 (ref 0.0–4.0)
Total CK: 164 U/L (ref 7–177)

## 2013-07-06 LAB — AMYLASE: AMYLASE: 27 U/L (ref 0–105)

## 2013-07-06 LAB — TROPONIN I: Troponin I: <0.01 ng/mL (ref <0.06)

## 2013-07-06 LAB — TSH: TSH: 1.414 u[IU]/mL (ref 0.350–4.500)

## 2013-07-06 LAB — LIPASE: LIPASE: 27 U/L (ref 0–75)

## 2013-07-17 ENCOUNTER — Encounter: Payer: Self-pay | Admitting: Physician Assistant

## 2013-07-17 DIAGNOSIS — L748 Other eccrine sweat disorders: Secondary | ICD-10-CM | POA: Insufficient documentation

## 2013-07-19 ENCOUNTER — Other Ambulatory Visit: Payer: Self-pay | Admitting: *Deleted

## 2013-07-19 MED ORDER — FREESTYLE LANCETS MISC: Status: DC

## 2013-07-24 ENCOUNTER — Other Ambulatory Visit: Payer: Self-pay | Admitting: Physician Assistant

## 2013-07-27 ENCOUNTER — Encounter: Payer: Self-pay | Admitting: Physician Assistant

## 2013-07-27 ENCOUNTER — Ambulatory Visit (INDEPENDENT_AMBULATORY_CARE_PROVIDER_SITE_OTHER): Payer: 59 | Admitting: Physician Assistant

## 2013-07-27 VITALS — BP 117/63 | HR 64 | Wt 169.0 lb

## 2013-07-27 DIAGNOSIS — J309 Allergic rhinitis, unspecified: Secondary | ICD-10-CM

## 2013-07-27 DIAGNOSIS — I10 Essential (primary) hypertension: Secondary | ICD-10-CM

## 2013-07-27 DIAGNOSIS — K219 Gastro-esophageal reflux disease without esophagitis: Secondary | ICD-10-CM

## 2013-07-27 DIAGNOSIS — D239 Other benign neoplasm of skin, unspecified: Secondary | ICD-10-CM

## 2013-07-27 DIAGNOSIS — E119 Type 2 diabetes mellitus without complications: Secondary | ICD-10-CM

## 2013-07-27 DIAGNOSIS — J302 Other seasonal allergic rhinitis: Secondary | ICD-10-CM

## 2013-07-27 MED ORDER — SITAGLIPTIN PHOSPHATE 100 MG PO TABS: 100.0000 mg | ORAL_TABLET | Freq: Every day | ORAL | Status: DC

## 2013-07-27 MED ORDER — CETIRIZINE HCL 10 MG PO TABS: 10.0000 mg | ORAL_TABLET | Freq: Every day | ORAL | Status: DC

## 2013-07-27 NOTE — Patient Instructions (Addendum)
STart zyrtec daily. Consider decongestant as needed if feeling really congested.  Sent over [place] and stop onglyza. Recheck A1C in 09/2013. Continue to watch carbs and sugar.       Cryosurgery for Skin Conditions Cryosurgery, also called cryotherapy, is the use of extreme cold to freeze and remove abnormal or diseased tissue. Growths on the skin such as warts, precancerous skin lesions (actinic keratoses), and some kinds of skin cancer may be removed with cryosurgery. LET [facility] CARE PROVIDER KNOW ABOUT:  Any allergies you have.  All medicines you are taking, including vitamins, herbs, eye drops, creams, and over-the-counter medicines.  Previous problems you or members of your family have had with the use of anesthetics.  Any blood disorders you have.  Previous surgeries you have had.  Medical conditions you have. RISKS AND COMPLICATIONS Generally, this is a safe procedure. However, as with any procedure, complications can occur. Possible complications include:  Scars.  Changes in skin color (lighter or darker than normal skin tone).  Swelling.  Nerve damage and loss of feeling (rare). BEFORE THE PROCEDURE No preparation is necessary. PROCEDURE  Cryosurgery usually takes a few minutes and can be done in your health care provider's office. There are different methods for performing cryosurgery.   Your health care provider may use a device (probe) that has liquid nitrogen flowing through it. The liquid nitrogen cools the probe. The probe is then applied to the growth until it is frozen and destroyed.  Your health care provider may spray liquid nitrogen directly on the growth. AFTER THE PROCEDURE Shortly after the procedure, the treated area will become red and swollen. This is normal. You will be advised to keep the treated area clean and covered with a bandage until healed. You will be able to go home shortly after the procedure. You may need the treatment again if the  growth comes back. Document Released: 03/05/2000 Document Revised: 11/08/2012 Document Reviewed: 10/06/2012 [facility] Patient Information 2014 [place].

## 2013-07-30 NOTE — Progress Notes (Signed)
Subjective:    Patient ID: Diane Brooks, female    DOB: [birth year], 58 y.o.   MRN: [id number]  HPI Pt presents to the clinic to follow up on increase in oncolyza. Pt continues to notice increases in glucose readings despite no changes in diet from oncolyza to [place]. She would like to change back. On januvia readings were 106, 109, 108 and 95. They have now increased to 130, 121, 132, 152. Denies any symptoms or complaints. She has never been called for nutritionist. She is trying to walk a couple times a week.   Hiatal hernia/GERD- she feels the best she has ever felt. dexilant is working great. No problems at all. No stomach or chest pain.   She has a bump on her back that is very irritated. It rubs her clothes and bra. She would like it removed. She had it frozen once but never went back to have refrozen. It did seem to shrink but came back. Been present for 3-5 years.   Allergies are bad especially to grass. Currently doing nothing for them. Notices itchy watery eyes when outside or husband mows grass.   HTN- great. No problems on concerns. No CP, palpitations, headaches or vision changes.     Review of Systems     Objective:   Physical Exam  Constitutional: She is oriented to person, place, and time. She appears well-developed and well-nourished.  HENT:  Head: Normocephalic and atraumatic.  Cardiovascular: Normal rate, regular rhythm and normal heart sounds.   Pulmonary/Chest: Effort normal and breath sounds normal. She has no wheezes.    Neurological: She is alert and oriented to person, place, and time.  Psychiatric: She has a normal mood and affect. Her behavior is normal.          Assessment & Plan:  DM/overweight- still not been called for nutritionist. In system shows referral made. Will get patricia to call and give pt number to call herself and make sure on the right track. I do think a nutritionist would help pt manage her sugars better. Will send over for  Januvia 100mg  daily. Stop oncolyza. It appears by log sugars have been on the rise. Will have to do PA but feel like with failure they should approve. Continue on metformin. Too early to check A1C today. Follow up in 2 months. Reminded to get yearly eye exam.   Dermatofibroma- attempted cryotherapy today. Care instructions given in HO.   Cryotherapy Procedure Note  Pre-operative Diagnosis: irriated dermatofibroma   Post-operative Diagnosis: irritated dermatofibroma  Locations: left upper back  Indications: irritated discomfort  Anesthesia: none  Procedure Details  History of allergy to iodine: no. Pacemaker? no.  Patient informed of risks (permanent scarring, infection, light or dark discoloration, bleeding, infection, weakness, numbness and recurrence of the lesion) and benefits of the procedure and verbal informed consent obtained.  The areas are treated with liquid nitrogen therapy, frozen until ice ball extended 2-4 mm beyond lesion, allowed to thaw, and treated again. The patient tolerated procedure well.  The patient was instructed on post-op care, warned that there may be blister formation, redness and pain. Recommend OTC analgesia as needed for pain.  Condition: Stable  Complications: none.  Plan: 1. Instructed to keep the area dry and covered for 24-48h and clean thereafter. 2. Warning signs of infection were reviewed.   3. Recommended that the patient use OTC acetaminophen as needed for pain.  4. Return in 2 weeks.   Hiatal hernia/GERD- much controlled on dexilant.  Continue taking daily.    Seasonal allergies- continue zyrtec daily 10mg . Consider decongestant on bad days or addition of mucinex. Nasal washes can also help.   HTN- controlled. Refill as needed.

## 2013-08-03 ENCOUNTER — Ambulatory Visit (INDEPENDENT_AMBULATORY_CARE_PROVIDER_SITE_OTHER): Payer: 59 | Admitting: Physician Assistant

## 2013-08-03 ENCOUNTER — Encounter: Payer: Self-pay | Admitting: Physician Assistant

## 2013-08-03 VITALS — BP 121/69 | HR 64 | Ht 62.5 in | Wt 172.0 lb

## 2013-08-03 DIAGNOSIS — J309 Allergic rhinitis, unspecified: Secondary | ICD-10-CM | POA: Insufficient documentation

## 2013-08-03 DIAGNOSIS — J302 Other seasonal allergic rhinitis: Secondary | ICD-10-CM | POA: Insufficient documentation

## 2013-08-03 DIAGNOSIS — B360 Pityriasis versicolor: Secondary | ICD-10-CM | POA: Insufficient documentation

## 2013-08-03 MED ORDER — METHYLPREDNISOLONE (PAK) 4 MG PO TABS: ORAL_TABLET | ORAL | Status: DC

## 2013-08-03 MED ORDER — CLOTRIMAZOLE 1 % EX CREA: 1.0000 "application " | TOPICAL_CREAM | Freq: Two times a day (BID) | CUTANEOUS | Status: DC

## 2013-08-03 MED ORDER — BECLOMETHASONE DIPROPIONATE 80 MCG/ACT NA AERS: 2.0000 | INHALATION_SPRAY | Freq: Every day | NASAL | Status: DC

## 2013-08-03 NOTE — Patient Instructions (Signed)
Samples given to try qnasl 2 sprays each nostril daily.  Start medrol dose pak.  Continue on zyrtec daily.   Use cream twice a neck for until the rash is gone or up to 4 weeks.   Tinea Versicolor Tinea versicolor is a common yeast infection of the skin. This condition becomes known when the yeast on our skin starts to overgrow (yeast is a normal inhabitant on our skin). This condition is noticed as white or light brown patches on brown skin, and is more evident in the summer on tanned skin. These areas are slightly scaly if scratched. The light patches from the yeast become evident when the yeast creates "holes in your suntan". This is most often noticed in the summer. The patches are usually located on the chest, back, pubis, neck and body folds. However, it may occur on any area of body. Mild itching and inflammation (redness or soreness) may be present. DIAGNOSIS  The diagnosisof this is made clinically (by looking). Cultures from samples are usually not needed. Examination under the microscope may help. However, yeast is normally found on skin. The diagnosis still remains clinical. Examination under Wood's Ultraviolet Light can determine the extent of the infection. TREATMENT  This common infection is usually only of cosmetic (only a concern to your appearance). It is easily treated with dandruff shampoo used during showers or bathing. Vigorous scrubbing will eliminate the yeast over several days time. The light areas in your skin may remain for weeks or months after the infection is cured unless your skin is exposed to sunlight. The lighter or darker spots caused by the fungus that remain after complete treatment are not a sign of treatment failure; it will take a long time to resolve. Your caregiver may recommend a number of commercial preparations or medication by mouth if home care is not working. Recurrence is common and preventative medication may be necessary. This skin condition is not highly  contagious. Special care is not needed to protect close friends and family members. Normal hygiene is usually enough. Follow up is required only if you develop complications (such as a secondary infection from scratching), if recommended by your caregiver, or if no relief is obtained from the preparations used. Document Released: 03/05/2000 Document Revised: 05/31/2011 Document Reviewed: 04/17/2008 [facility] Patient Information 2014 [place], [place].

## 2013-08-03 NOTE — Progress Notes (Signed)
   Subjective:    Patient ID: Diane Brooks, female    DOB: 1956-01-08, 58 y.o.   MRN: [id number]  HPI The patient is a 58 year old female who presents to the clinic with seasonal allergy symptoms. She has had acute worsening of her seasonal allergies in the last 2-3 days. The most significant symptom is her nasal congestion. She denies any cough, wheezing, shortness of breath, fever, chills, nausea, vomiting, ear pain. The last 3 days her eyes have been itchy and watery and a lot of nasal congestion. She's tried Claritin which did not work. And she is on Zyrtec which works but makes her very sleepy.  Patient also has a rash is around her neck. It is slightly hyperpigmented. It is itchy. Worse in the summer. Had for many months. Sometimes appears a little scaly. Nothing done to treat. Going outside and hot day makes worse.    Review of Systems  All other systems reviewed and are negative.      Objective:   Physical Exam  Constitutional: She is oriented to person, place, and time. She appears well-developed and well-nourished.  HENT:  Head: Normocephalic and atraumatic.  Bilateral TMs injected.  Oropharynx presents with postnasal drip.  Bilateral nasal turbinates red and swollen.  Negative for any maxillary or frontal sinus tenderness to palpation.  Eyes:  Bilateral conjunctiva injected with watery discharge.  Neck: Normal range of motion. Neck supple.  Cardiovascular: Normal rate, regular rhythm and normal heart sounds.   Pulmonary/Chest: Effort normal and breath sounds normal. She has no wheezes.  Lymphadenopathy:    She has no cervical adenopathy.  Neurological: She is alert and oriented to person, place, and time.  Skin: Skin is dry.  Psychiatric: She has a normal mood and affect. Her behavior is normal.          Assessment & Plan:  seasonal allergies/allergic rhinitis- gave sample and rx for qnasl daily. Continue zytrec 10mg  daily. Consider nasal saline washes daily.  OTC anti-histamine eye drops for eyes. Medrol dose pak was given to help acutely with symptoms.    Tinea versicolor- anti fungal cream was given today. Discussed sometimes fungal infections can be resistant to treatment. Continue for up to 4 weeks. Gave handout with information.

## 2013-09-07 ENCOUNTER — Other Ambulatory Visit: Payer: Self-pay | Admitting: *Deleted

## 2013-09-07 MED ORDER — AMBULATORY NON FORMULARY MEDICATION: Status: DC

## 2013-09-17 ENCOUNTER — Other Ambulatory Visit: Payer: Self-pay | Admitting: *Deleted

## 2013-09-17 MED ORDER — ASPIRIN 81 MG PO TABS: 81.0000 mg | ORAL_TABLET | Freq: Every day | ORAL | Status: DC

## 2013-09-17 NOTE — Telephone Encounter (Signed)
Refill for aspirin sent to pharmacy

## 2013-09-19 ENCOUNTER — Ambulatory Visit (INDEPENDENT_AMBULATORY_CARE_PROVIDER_SITE_OTHER): Payer: 59 | Admitting: Physician Assistant

## 2013-09-19 ENCOUNTER — Encounter: Payer: Self-pay | Admitting: Physician Assistant

## 2013-09-19 VITALS — BP 124/68 | HR 65 | Ht 62.5 in | Wt 171.0 lb

## 2013-09-19 DIAGNOSIS — M67449 Ganglion, unspecified hand: Secondary | ICD-10-CM

## 2013-09-19 DIAGNOSIS — E119 Type 2 diabetes mellitus without complications: Secondary | ICD-10-CM

## 2013-09-19 DIAGNOSIS — L723 Sebaceous cyst: Secondary | ICD-10-CM

## 2013-09-19 DIAGNOSIS — E785 Hyperlipidemia, unspecified: Secondary | ICD-10-CM

## 2013-09-19 DIAGNOSIS — R635 Abnormal weight gain: Secondary | ICD-10-CM

## 2013-09-19 DIAGNOSIS — I10 Essential (primary) hypertension: Secondary | ICD-10-CM

## 2013-09-19 MED ORDER — METFORMIN HCL 1000 MG PO TABS: ORAL_TABLET | ORAL | Status: DC

## 2013-09-19 MED ORDER — ATENOLOL-CHLORTHALIDONE 100-25 MG PO TABS: 1.0000 | ORAL_TABLET | Freq: Every day | ORAL | Status: DC

## 2013-09-19 MED ORDER — PHENTERMINE HCL 37.5 MG PO CAPS
37.5000 mg | ORAL_CAPSULE | ORAL | Status: DC
Start: 2013-09-19 — End: 2013-11-09

## 2013-09-19 MED ORDER — PRAVASTATIN SODIUM 40 MG PO TABS: 40.0000 mg | ORAL_TABLET | Freq: Every day | ORAL | Status: DC

## 2013-09-19 MED ORDER — DEXLANSOPRAZOLE 60 MG PO CPDR: 60.0000 mg | DELAYED_RELEASE_CAPSULE | Freq: Every day | ORAL | Status: DC

## 2013-09-19 MED ORDER — FUROSEMIDE 20 MG PO TABS: ORAL_TABLET | ORAL | Status: DC

## 2013-09-19 NOTE — Patient Instructions (Signed)

## 2013-09-24 NOTE — Progress Notes (Signed)
   Subjective:    Patient ID: Diane Brooks, female    DOB: [birth year], 58 y.o.   MRN: [id number]  HPI Patient is a 58 year old female who presents to the clinic for 3 month followup.  Diabetes mellitus-patient is currently on metformin only. She was previously on Januvia. She has noticed her fasting blood sugars creeping up a bit but still relatively controlled. She denies any hypoglycemic events. She denies any vision changes. Patient is currently very concerned with her weight. She has tried Weight Watchers and she is walking daily. She is having trouble budgeting at all in the way category. She has lost a few inches. She would like something to help to start her weight loss.  Hypertension-well controlled today on atenolol/chlorthalidone. Denies any chest tightness, palpitations, headaches or vision changes.  GERD-controlled with Tessalon.  Hyperlipidemia currently taking Pravachol daily. Up-to-date lipid panel done.  Patient also has a small nodule on her right index finger. Is not painful. She has noticed it for about the last 2 weeks. Seems to be soft. She denies any known trauma. She's not had anything to make better. Nothing seems to make it worse. It seems to be stable in size since she first noticed it.     Review of Systems  All other systems reviewed and are negative.      Objective:   Physical Exam  Constitutional: She is oriented to person, place, and time. She appears well-developed and well-nourished.  Overweight  HENT:  Head: Normocephalic and atraumatic.  Cardiovascular: Normal rate, regular rhythm and normal heart sounds.   Pulmonary/Chest: Effort normal and breath sounds normal.  Musculoskeletal:       Arms: Neurological: She is alert and oriented to person, place, and time.  Skin: Skin is dry.  Psychiatric: She has a normal mood and affect. Her behavior is normal.          Assessment & Plan:  DM- .[name] Lab Results  Component Value Date   HGBA1C 5.9  06/27/2013   Reassured patient that she is doing great with her glucose control. Continue on metformin twice a day. Continue walking and losing weight. We can certainly consider decreasing metformin dosage if continues to improve.  Abnormal weight gain- after discussion we decided to start phentermine in the mornings. Side effects discussed. Patient is aware if her mood changes to call office and stop medication. Patient also warned of side effects of heart palpitations. She experiences these she is to stop medication and call office. She does need to come in monthly for weight and blood pressure checks. Encouraged patient to weight loss is very dependent on amount of diet changes and exercise added.   Hypertension-atenolol/chlorthalidone refilled for 6 months today.  GERD-refilled Dexon for 6 months.  Mucosal cyst- given handout and described the benign nature of. It continues to grow certainly call office and we can consider other treatment. Encouraged compression with Band-Aid for the next week. Follow up as needed.

## 2013-10-22 ENCOUNTER — Other Ambulatory Visit: Payer: Self-pay | Admitting: Family Medicine

## 2013-10-26 ENCOUNTER — Ambulatory Visit (INDEPENDENT_AMBULATORY_CARE_PROVIDER_SITE_OTHER): Payer: 59 | Admitting: Physician Assistant

## 2013-10-26 ENCOUNTER — Encounter: Payer: Self-pay | Admitting: Physician Assistant

## 2013-10-26 VITALS — BP 116/65 | HR 58 | Ht 62.5 in | Wt 171.0 lb

## 2013-10-26 DIAGNOSIS — N951 Menopausal and female climacteric states: Secondary | ICD-10-CM

## 2013-10-26 DIAGNOSIS — R635 Abnormal weight gain: Secondary | ICD-10-CM

## 2013-10-26 DIAGNOSIS — E669 Obesity, unspecified: Secondary | ICD-10-CM

## 2013-10-26 DIAGNOSIS — R232 Flushing: Secondary | ICD-10-CM

## 2013-10-26 DIAGNOSIS — K449 Diaphragmatic hernia without obstruction or gangrene: Secondary | ICD-10-CM

## 2013-10-26 MED ORDER — DICYCLOMINE HCL 10 MG PO CAPS: 10.0000 mg | ORAL_CAPSULE | Freq: Two times a day (BID) | ORAL | Status: DC

## 2013-10-26 NOTE — Patient Instructions (Addendum)
Try phentermine 1/2 tablet daily.   Hiatal Hernia A hiatal hernia occurs when part of your stomach slides above the muscle that separates your abdomen from your chest (diaphragm). You can be born with a hiatal hernia (congenital), or it may develop over time. In almost all cases of hiatal hernia, only the top part of the stomach pushes through.  Many people have a hiatal hernia with no symptoms. The larger the hernia, the more likely that you will have symptoms. In some cases, a hiatal hernia allows stomach acid to flow back into the tube that carries food from your mouth to your stomach (esophagus). This may cause heartburn symptoms. Severe heartburn symptoms may mean you have developed a condition called gastroesophageal reflux disease (GERD).  CAUSES  Hiatal hernias are caused by a weakness in the opening (hiatus) where your esophagus passes through your diaphragm to attach to the upper part of your stomach. You may be born with a weakness in your hiatus, or a weakness can develop. RISK FACTORS Older age is a major risk factor for a hiatal hernia. Anything that increases pressure on your diaphragm can also increase your risk of a hiatal hernia. This includes:  Pregnancy.  Excess weight.  Frequent constipation. SIGNS AND SYMPTOMS  People with a hiatal hernia often have no symptoms. If symptoms develop, they are almost always caused by GERD. They may include:  Heartburn.  Belching.  Indigestion.  Trouble swallowing.  Coughing or wheezing.  Sore throat.  Hoarseness.  Chest pain. DIAGNOSIS  A hiatal hernia is sometimes found during an exam for another problem. Your health care provider may suspect a hiatal hernia if you have symptoms of GERD. Tests may be done to diagnose GERD. These may include:  X-rays of your stomach or chest.  An upper gastrointestinal (GI) series. This is an X-ray exam of your GI tract involving the use of a chalky liquid that you swallow. The liquid shows  up clearly on the X-ray.  Endoscopy. This is a procedure to look into your stomach using a thin, flexible tube that has a tiny camera and light on the end of it. TREATMENT  If you have no symptoms, you may not need treatment. If you have symptoms, treatment may include:  Dietary and lifestyle changes to help reduce GERD symptoms.  Medicines. These may include:  Over-the-counter antacids.  Medicines that make your stomach empty more quickly.  Medicines that block the production of stomach acid (H2 blockers).  Stronger medicines to reduce stomach acid (proton pump inhibitors).  You may need surgery to repair the hernia if other treatments are not helping. HOME CARE INSTRUCTIONS   Take all medicines as directed by your health care provider.  Quit smoking, if you smoke.  Try to achieve and maintain a healthy body weight.  Eat frequent small meals instead of three large meals a day. This keeps your stomach from getting too full.  Eat slowly.  Do not lie down right after eating.  Do noteat 1-2 hours before bed.   Do not drink beverages with caffeine. These include cola, coffee, cocoa, and tea.  Do not drink alcohol.  Avoid foods that can make symptoms of GERD worse. These may include:  Fatty foods.  Citrus fruits.  Other foods and drinks that contain acid.  Avoid putting pressure on your belly. Anything that puts pressure on your belly increases the amount of acid that may be pushed up into your esophagus.   Avoid bending over, especially after eating.  Raise the head of your bed by putting blocks under the legs. This keeps your head and esophagus higher than your stomach.  Do not wear tight clothing around your chest or stomach.  Try not to strain when having a bowel movement, when urinating, or when lifting heavy objects. SEEK MEDICAL CARE IF:  Your symptoms are not controlled with medicines or lifestyle changes.  You are having trouble swallowing.  You  have coughing or wheezing that will not go away. SEEK IMMEDIATE MEDICAL CARE IF:  Your pain is getting worse.  Your pain spreads to your arms, neck, jaw, teeth, or back.  You have shortness of breath.  You sweat for no reason.  You feel sick to your stomach (nauseous) or vomit.  You vomit blood.  You have bright red blood in your stools.  You have black, tarry stools.  Document Released: 05/29/2003 Document Revised: 07/23/2013 Document Reviewed: 02/23/2013 [facility] Patient Information 2015 [place], [place]. This information is not intended to replace advice given to you by your health care provider. Make sure you discuss any questions you have with your health care provider.   Menopause and Herbal Products Menopause is the normal time of life when menstrual periods stop completely. Menopause is complete when you have missed 12 consecutive menstrual periods. It usually occurs between the ages of 40 to 48, with an average age of 43. Very rarely does a woman develop menopause before 56 years old. At menopause, your ovaries stop producing the female hormones, estrogen and progesterone. This can cause undesirable symptoms and also affect your health. Sometimes the symptoms can occur 4 to 5 years before the menopause begins. There is no relationship between menopause and:  Oral contraceptives.  Number of children you had.  Race.  The age your menstrual periods started (menarche). Heavy smokers and very thin women may develop menopause earlier in life. Estrogen and progesterone hormone treatment is the usual method of treating menopausal symptoms. However, there are women who should not take hormone treatment. This is true of:   Women that have breast or uterine cancer.  Women who prefer not to take hormones because of certain side effects (abnormal uterine bleeding).  Women who are afraid that hormones may cause breast cancer.  Women who have a history of liver disease, heart  disease, stroke, or blood clots. For these women, there are other medications that may help treat their menopausal symptoms. These medications are found in plants and botanical products. They can be found in the form of herbs, teas, oils, tinctures, and pills.  CAUSES:  The ovaries stop producing the female hormones estrogen and progesterone.  Other causes include:  Surgery to remove both ovaries.  The ovaries stop functioning for no know reason.  Tumors of the pituitary gland in the brain.  Medical disease that affects the ovaries and hormone production.  Radiation treatment to the abdomen or pelvis.  Chemotherapy that affects the ovaries. PHYTOESTROGENS: Phytoestrogens occur naturally in plants and plant products. They act like estrogen in the body. Herbal medications are made from these plants and botanical steroids. There are 3 types of phytoestrogens:  Isoflavones (genistein and daidzein) are found in soy, garbanzo beans, miso and tofu foods.  Ligins are found in the shell of seeds. They are used to make oils like flaxseed oil. The bacteria in your intestine act on these foods to produce the estrogen-like hormones.  Coumestans are estrogen-like. Some of the foods they are found in include sunflower seeds and bean sprouts. CONDITIONS  AND THEIR POSSIBLE HERBAL TREATMENT:  Hot flashes and night sweats.  Soy, black cohosh and evening primrose.  Irritability, insomnia, depression and memory problems.  Chasteberry, ginseng, and soy.  St. John's wort may be helpful for depression. However, there is a concern of it causing cataracts of the eye and may have bad effects on other medications. St. John's wort should not be taken for long time and without your caregiver's advice.  Loss of libido and vaginal and skin dryness.  Wild yam and soy.  Prevention of coronary heart disease and osteoporosis.  Soy and Isoflavones. Several studies have shown that some women benefit from  herbal medications, but most of the studies have not consistently shown that these supplements are much better than placebo. Other forms of treatment to help women with menopausal symptoms include a balanced diet, rest, exercise, vitamin and calcium (with vitamin D) supplements, acupuncture, and group therapy when necessary. THOSE WHO SHOULD NOT TAKE HERBAL MEDICATIONS INCLUDE:  Women who are planning on getting pregnant unless told by your caregiver.  Women who are breastfeeding unless told by your caregiver.  Women who are taking other prescription medications unless told by your caregiver.  Infants, children, and elderly women unless told by your caregiver. Different herbal medications have different and unmeasured amounts of the herbal ingredients. There are no regulations, quality control, and standardization of the ingredients in herbal medications. Therefore, the amount of the ingredient in the medication may vary from one herb, pill, tea, oil or tincture to another. Many herbal medications can cause serious problems and can even have poisonous effects if taken too much or too long. If problems develop, the medication should be stopped and recorded by your caregiver. HOME CARE INSTRUCTIONS  Do not take or give children herbal medications without your caregiver's advice.  Let your caregiver know all the medications you are taking. This includes prescription, over-the-counter, eye drops, and creams.  Do not take herbal medications longer or more than recommended.  Tell your caregiver about any side effects from the medication. SEEK MEDICAL CARE IF:  You develop a fever of 102 F (38.9 C), or as directed by your caregiver.  You feel sick to your stomach (nauseous), vomit, or have diarrhea.  You develop a rash.  You develop abdominal pain.  You develop severe headaches.  You start to have vision problems.  You feel dizzy or faint.  You start to feel numbness in any part of  your body.  You start shaking (have convulsions). Document Released: 08/25/2007 Document Revised: 02/23/2012 Document Reviewed: 03/24/2010 [facility] Patient Information 2015 [place], [place]. This information is not intended to replace advice given to you by your health care provider. Make sure you discuss any questions you have with your health care provider.

## 2013-10-29 DIAGNOSIS — E669 Obesity, unspecified: Secondary | ICD-10-CM | POA: Insufficient documentation

## 2013-10-29 DIAGNOSIS — E6609 Other obesity due to excess calories: Secondary | ICD-10-CM | POA: Insufficient documentation

## 2013-10-29 DIAGNOSIS — R232 Flushing: Secondary | ICD-10-CM | POA: Insufficient documentation

## 2013-10-29 NOTE — Progress Notes (Signed)
   Subjective:    Patient ID: Diane Brooks, female    DOB: [birth year], 58 y.o.   MRN: [id number]  HPI Pt presents to the clinic to follow up on obesity and abnormal weight gain. Full dose of phentermine was taken 2 times but made her heart flutter. She stopped. No weight loss. She did go to [employer] on vacation. Admits to not really dieting or exercising. No other side effects noted.   She has been having more hot flashes. Has been in menopause for years. Waves of heat come all over her. Nothing tried and nothing makes better.   She has started having more discomfort in epigastric area. No burning or reflux. Continues on dexilant. Hx of hital hernia.    Review of Systems  All other systems reviewed and are negative.      Objective:   Physical Exam  Constitutional: She is oriented to person, place, and time. She appears well-developed and well-nourished.  HENT:  Head: Normocephalic and atraumatic.  Cardiovascular: Normal rate, regular rhythm and normal heart sounds.   Pulmonary/Chest: Effort normal and breath sounds normal. She has no wheezes.  Neurological: She is alert and oriented to person, place, and time.  Skin: Skin is dry.  Psychiatric: She has a normal mood and affect. Her behavior is normal.          Assessment & Plan:  Obesity/abnormal weight gain/palpitations- try cutting phentermine in halft first and see if symptoms resolve. If not stop and let office know. Interval training also benefical for weight loss. Has nutritionist appt next week to help with meal planning. Follow up in 1 month.   Hot flashes- gave handout on natural remedies. Follow up if needed.   Hiatal hernia- she does have this after EGD. Follow up with gastroenterologist if remains symptomatic. Stay on dexlant. Watch diet for certain foods is the mainstay of treatment.

## 2013-11-09 ENCOUNTER — Telehealth: Payer: Self-pay

## 2013-11-09 ENCOUNTER — Encounter: Payer: Self-pay | Admitting: Physician Assistant

## 2013-11-09 ENCOUNTER — Ambulatory Visit (INDEPENDENT_AMBULATORY_CARE_PROVIDER_SITE_OTHER): Payer: 59 | Admitting: Physician Assistant

## 2013-11-09 ENCOUNTER — Ambulatory Visit (INDEPENDENT_AMBULATORY_CARE_PROVIDER_SITE_OTHER): Payer: 59

## 2013-11-09 VITALS — BP 132/73 | HR 65 | Temp 97.9°F | Ht 62.5 in | Wt 168.0 lb

## 2013-11-09 DIAGNOSIS — R1013 Epigastric pain: Secondary | ICD-10-CM | POA: Insufficient documentation

## 2013-11-09 DIAGNOSIS — R933 Abnormal findings on diagnostic imaging of other parts of digestive tract: Secondary | ICD-10-CM

## 2013-11-09 DIAGNOSIS — R1012 Left upper quadrant pain: Secondary | ICD-10-CM

## 2013-11-09 DIAGNOSIS — M62830 Muscle spasm of back: Secondary | ICD-10-CM

## 2013-11-09 DIAGNOSIS — M5380 Other specified dorsopathies, site unspecified: Secondary | ICD-10-CM

## 2013-11-09 LAB — CBC WITH DIFFERENTIAL/PLATELET
Basophils Absolute: 0 10*3/uL (ref 0.0–0.1)
Basophils Relative: 0 % (ref 0–1)
Eosinophils Absolute: 0.1 10*3/uL (ref 0.0–0.7)
Eosinophils Relative: 2 % (ref 0–5)
HEMATOCRIT: 37.6 % (ref 36.0–46.0)
HEMOGLOBIN: 13.2 g/dL (ref 12.0–15.0)
Lymphocytes Relative: 41 % (ref 12–46)
Lymphs Abs: 2.1 10*3/uL (ref 0.7–4.0)
MCH: 30.8 pg (ref 26.0–34.0)
MCHC: 35.1 g/dL (ref 30.0–36.0)
MCV: 87.6 fL (ref 78.0–100.0)
MONO ABS: 0.4 10*3/uL (ref 0.1–1.0)
MONOS PCT: 7 % (ref 3–12)
NEUTROS ABS: 2.6 10*3/uL (ref 1.7–7.7)
Neutrophils Relative %: 50 % (ref 43–77)
Platelets: 232 10*3/uL (ref 150–400)
RBC: 4.29 MIL/uL (ref 3.87–5.11)
RDW: 13.7 % (ref 11.5–15.5)
WBC: 5.1 10*3/uL (ref 4.0–10.5)

## 2013-11-09 LAB — COMPLETE METABOLIC PANEL WITH GFR
ALBUMIN: 4.5 g/dL (ref 3.5–5.2)
ALK PHOS: 40 U/L (ref 39–117)
ALT: 19 U/L (ref 0–35)
AST: 18 U/L (ref 0–37)
BILIRUBIN TOTAL: 0.3 mg/dL (ref 0.2–1.2)
BUN: 21 mg/dL (ref 6–23)
CO2: 30 mEq/L (ref 19–32)
Calcium: 9.7 mg/dL (ref 8.4–10.5)
Chloride: 102 mEq/L (ref 96–112)
Creat: 0.77 mg/dL (ref 0.50–1.10)
GFR, Est African American: >89 mL/min
GFR, Est Non African American: 87 mL/min
Glucose, Bld: 109 mg/dL — ABNORMAL HIGH (ref 70–99)
POTASSIUM: 4.1 meq/L (ref 3.5–5.3)
Sodium: 141 mEq/L (ref 135–145)
TOTAL PROTEIN: 7 g/dL (ref 6.0–8.3)

## 2013-11-09 LAB — CK TOTAL AND CKMB (NOT AT ARMC)
CK, MB: 2.2 ng/mL (ref 0.0–5.0)
RELATIVE INDEX: 2.4 (ref 0.0–4.0)
Total CK: 92 U/L (ref 7–177)

## 2013-11-09 LAB — LIPASE: LIPASE: 23 U/L (ref 0–75)

## 2013-11-09 LAB — AMYLASE: Amylase: 24 U/L (ref 0–105)

## 2013-11-09 MED ORDER — HYDROCODONE-ACETAMINOPHEN 5-325 MG PO TABS: 1.0000 | ORAL_TABLET | Freq: Three times a day (TID) | ORAL | Status: DC | PRN

## 2013-11-09 MED ORDER — IOHEXOL 300 MG/ML  SOLN
100.0000 mL | Freq: Once | INTRAMUSCULAR | Status: AC | PRN
  Administered 2013-11-09: 100 mL via INTRAVENOUS

## 2013-11-09 MED ORDER — CYCLOBENZAPRINE HCL 10 MG PO TABS: 10.0000 mg | ORAL_TABLET | Freq: Three times a day (TID) | ORAL | Status: DC | PRN

## 2013-11-09 MED ORDER — KETOROLAC TROMETHAMINE 60 MG/2ML IM SOLN
60.0000 mg | Freq: Once | INTRAMUSCULAR | Status: AC
Start: 2013-11-09 — End: 2013-11-09
  Administered 2013-11-09: 60 mg via INTRAMUSCULAR

## 2013-11-09 NOTE — Telephone Encounter (Signed)
Patient is to have a CT abdomen and pelvis with contrast. Prior Auth # [id number].

## 2013-11-09 NOTE — Patient Instructions (Signed)
SToP any anti-inflammatories.   Will call with labs and CT.

## 2013-11-09 NOTE — Progress Notes (Signed)
Subjective:    Patient ID: Diane Brooks, female    DOB: 02-23-1956, 58 y.o.   MRN: [id number]  HPI Pt presents to the clinic with epigastric pain for last 1 and 1/2 weeks. Bentyl helps some. Stomach feels very bloated. Not able to sleep well due to discomfort and pain. Seems to radiate into chest and shoulder at some points. Gas all the time. No pain with urination. No fever, chills. No appetite. Currently taking dexilant. Hx of hiatal hernia and GERD.     Review of Systems  All other systems reviewed and are negative.      Objective:   Physical Exam  Constitutional: She is oriented to person, place, and time. She appears well-developed and well-nourished.  HENT:  Head: Normocephalic and atraumatic.  Cardiovascular: Normal rate, regular rhythm and normal heart sounds.   Pulmonary/Chest: Effort normal and breath sounds normal. She has no wheezes.  No CVA tenderness.   Abdominal: Soft. Bowel sounds are normal.  Discomfort with palpation over epigastric area. No rebound or guarding.  Discomfort of LuQ.  Negative murphys sign.    Neurological: She is alert and oriented to person, place, and time.  Skin: Skin is dry.  Psychiatric: She has a normal mood and affect. Her behavior is normal.          Assessment & Plan:  Epigastric abdominal pain/LUQ pain- will check sTAT labs with h. Pylori.   Toradol 26m IM given in office today.   .. Results for orders placed in visit on 11/09/13  CBC WITH DIFFERENTIAL      Result Value Ref Range   WBC 5.1  4.0 - 10.5 K/uL   RBC 4.29  3.87 - 5.11 MIL/uL   Hemoglobin 13.2  12.0 - 15.0 g/dL   HCT 37.6  36.0 - 46.0 %   MCV 87.6  78.0 - 100.0 fL   MCH 30.8  26.0 - 34.0 pg   MCHC 35.1  30.0 - 36.0 g/dL   RDW 13.7  11.5 - 15.5 %   Platelets 232  150 - 400 K/uL   Neutrophils Relative % 50  43 - 77 %   Neutro Abs 2.6  1.7 - 7.7 K/uL   Lymphocytes Relative 41  12 - 46 %   Lymphs Abs 2.1  0.7 - 4.0 K/uL   Monocytes Relative 7  3 - 12 %   Monocytes Absolute 0.4  0.1 - 1.0 K/uL   Eosinophils Relative 2  0 - 5 %   Eosinophils Absolute 0.1  0.0 - 0.7 K/uL   Basophils Relative 0  0 - 1 %   Basophils Absolute 0.0  0.0 - 0.1 K/uL   Smear Review Criteria for review not met    AMYLASE      Result Value Ref Range   Amylase 24  0 - 105 U/L  LIPASE      Result Value Ref Range   Lipase 23  0 - 75 U/L  CK TOTAL AND CKMB      Result Value Ref Range   Total CK 92  7 - 177 U/L   CK, MB 2.2  0.0 - 5.0 ng/mL   Relative Index 2.4  0.0 - 4.0  COMPLETE METABOLIC PANEL WITH GFR      Result Value Ref Range   Sodium 141  135 - 145 mEq/L   Potassium 4.1  3.5 - 5.3 mEq/L   Chloride 102  96 - 112 mEq/L   CO2 30  19 -  32 mEq/L   Glucose, Bld 109 (*) 70 - 99 mg/dL   BUN 21  6 - 23 mg/dL   Creat 0.77  0.50 - 1.10 mg/dL   Total Bilirubin 0.3  0.2 - 1.2 mg/dL   Alkaline Phosphatase 40  39 - 117 U/L   AST 18  0 - 37 U/L   ALT 19  0 - 35 U/L   Total Protein 7.0  6.0 - 8.3 g/dL   Albumin 4.5  3.5 - 5.2 g/dL   Calcium 9.7  8.4 - 10.5 mg/dL   GFR, Est African American >89     GFR, Est Non African American 87    H. PYLORI ANTIBODY, IGG      Result Value Ref Range   H Pylori IgG 0.61     CT scan done due to hx and continuing symptoms IMPRESSION:  1. There is an abnormal appearance of the stomach and duodenal bulb  worrisome for diffuse gastritis or inflammatory change related to  peptic ulcer disease. Malignancy is not excluded but felt less  likely. There is no perigastric or periduodenal abscess. Direct  visualization is recommended.  2. The small and large bowel exhibit no acute inflammation or other  acute abnormality.  3. There is no acute hepatobiliary or urinary tract abnormality.  There is no intra-abdominal or pelvic abscess nor lymphadenopathy.  4. There are likely multiple uterine fibroids. This could be  confirmed with an elective pelvic ultrasound.   Called pt with results. STOP any NSAIDs Needs follow up with GI  specialist.  Continue dexilant with carafate.

## 2013-11-11 LAB — H. PYLORI ANTIBODY, IGG: H Pylori IgG: 0.61 {ISR}

## 2013-11-15 ENCOUNTER — Ambulatory Visit (INDEPENDENT_AMBULATORY_CARE_PROVIDER_SITE_OTHER): Payer: 59 | Admitting: Internal Medicine

## 2013-11-15 ENCOUNTER — Encounter: Payer: Self-pay | Admitting: Internal Medicine

## 2013-11-15 VITALS — BP 112/76 | HR 76 | Ht 62.5 in | Wt 171.2 lb

## 2013-11-15 DIAGNOSIS — K219 Gastro-esophageal reflux disease without esophagitis: Secondary | ICD-10-CM

## 2013-11-15 DIAGNOSIS — R933 Abnormal findings on diagnostic imaging of other parts of digestive tract: Secondary | ICD-10-CM

## 2013-11-15 DIAGNOSIS — R1013 Epigastric pain: Secondary | ICD-10-CM

## 2013-11-15 DIAGNOSIS — K224 Dyskinesia of esophagus: Secondary | ICD-10-CM

## 2013-11-15 DIAGNOSIS — K3189 Other diseases of stomach and duodenum: Secondary | ICD-10-CM

## 2013-11-15 MED ORDER — DEXLANSOPRAZOLE 60 MG PO CPDR: 60.0000 mg | DELAYED_RELEASE_CAPSULE | Freq: Every day | ORAL | Status: DC

## 2013-11-15 NOTE — Progress Notes (Signed)
Subjective:    Patient ID: Diane Brooks, female    DOB: [birth year], 58 y.o.   MRN: [id number]  HPI Diane Brooks is a 58 year old female with a past medical history of GERD, hiatal hernia, gastritis, internal hemorrhoids, hypertension, hyperlipidemia and diabetes who is seen for followup. She was last seen in March 2015 at which point she continued to have issues with substernal chest pain felt to be heartburn as well as globus sensation. She had not yet started on Dexilant, but eventually got this medication approved and has been using 60 mg daily. She reports this helped significantly and she was very happy, but then over the last few weeks to a month she has had return of globus sensation, heartburn intermitted chest radiating to her back, frequent throat clearing. Also she has noticed decreased appetite and epigastric abdominal pain. This led to a CT scan which was performed 6 days ago, 11/09/2013. This showed abnormal appearance of the stomach and duodenal bulb worrisome for diffuse gastritis or inflammatory change. The large and small bowel appear normal. There was no acute hepatobiliary abnormality. She had multiple uterine fibroids. She also endorses sour brash. She states that she has had waxing and waning episodes of similar epigastric pain and exacerbations of heartburn over the last 40 years dating back to the 1970s. She describes this as a "roller coaster".  She did have an esophageal manometry performed in April which revealed a hypertensive esophagus consistent with nutcracker esophagus but normal LES relaxed sedation and esophageal bolus clearance..   Review of Systems As per history of present illness, otherwise negative  Current Medications, Allergies, Past Medical History, Past Surgical History, Family History and Social History were reviewed in [employer] record.     Objective:   Physical Exam BP 112/76  Pulse 76  Ht 5' 2.5" (1.588 m)  Wt 171 lb 3.2  oz (77.656 kg)  BMI 30.79 kg/m2 Constitutional: Well-developed and well-nourished. No distress. HEENT: Normocephalic and atraumatic. Oropharynx is clear and moist. No oropharyngeal exudate. Conjunctivae are normal.  No scleral icterus. Neck: Neck supple. Trachea midline. Cardiovascular: Normal rate, regular rhythm and intact distal pulses. No M/R/G Pulmonary/chest: Effort normal and breath sounds normal. No wheezing, rales or rhonchi. Abdominal: Soft, mild epigastric tenderness without rebound or guarding, nondistended. Bowel sounds active throughout. There are no masses palpable. Extremities: no clubbing, cyanosis, or edema Lymphadenopathy: No cervical adenopathy noted. Neurological: Alert and oriented to person place and time. Skin: Skin is warm and dry. No rashes noted. Psychiatric: Normal mood and affect. Behavior is normal.  CBC    Component Value Date/Time   WBC 5.1 11/09/2013 1054   RBC 4.29 11/09/2013 1054   HGB 13.2 11/09/2013 1054   HCT 37.6 11/09/2013 1054   PLT 232 11/09/2013 1054   MCV 87.6 11/09/2013 1054   MCH 30.8 11/09/2013 1054   MCHC 35.1 11/09/2013 1054   RDW 13.7 11/09/2013 1054   LYMPHSABS 2.1 11/09/2013 1054   MONOABS 0.4 11/09/2013 1054   EOSABS 0.1 11/09/2013 1054   BASOSABS 0.0 11/09/2013 1054    CMP     Component Value Date/Time   NA 141 11/09/2013 1054   K 4.1 11/09/2013 1054   CL 102 11/09/2013 1054   CO2 30 11/09/2013 1054   GLUCOSE 109* 11/09/2013 1054   BUN 21 11/09/2013 1054   CREATININE 0.77 11/09/2013 1054   CALCIUM 9.7 11/09/2013 1054   PROT 7.0 11/09/2013 1054   ALBUMIN 4.5 11/09/2013 1054   AST 18  11/09/2013 1054   ALT 19 11/09/2013 1054   ALKPHOS 40 11/09/2013 1054   BILITOT 0.3 11/09/2013 1054   GFRNONAA 87 11/09/2013 1054   GFRAA >89 11/09/2013 1054    Lipase     Component Value Date/Time   LIPASE 23 11/09/2013 1054   Previous GI procedures including prior endoscopy and colonoscopies reviewed, these were performed in [place]:   58 year old female with a past medical history of GERD, hiatal hernia, gastritis, internal hemorrhoids, hypertension, hyperlipidemia and diabetes who is seen for followup.  1. epigastric pain/GERD/abnormal GI imaging -- she's had exacerbation of GERD but also epigastric abdominal pain and has abnormal stomach and proximal duodenal imaging by CT. For this reason I recommended direct visualization with upper endoscopy. I will have her continue Dexilant 60 mg daily and also add ranitidine 300 mg in the evening. Hopefully the additional H2 blocker will improve some of her heartburn and dyspeptic symptoms. I recommended a GERD diet.  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 After endoscopy, we can consider calcium channel blocker which may help significantly with esophageal spasm and therefore chest pain.

## 2013-11-15 NOTE — Patient Instructions (Signed)
You have been scheduled for an endoscopy. Please follow written instructions given to you at your visit today. If you use inhalers (even only as needed), please bring them with you on the day of your procedure. Your physician has requested that you go to www.startemmi.com and enter the access code given to you at your visit today. This web site gives a general overview about your procedure. However, you should still follow specific instructions given to you by our office regarding your preparation for the procedure. Begin taking ranitidine at bedtime. Dexilant once daily. CC:  Iran Planas PA  Food Choices for Gastroesophageal Reflux Disease When you have gastroesophageal reflux disease (GERD), the foods you eat and your eating habits are very important. Choosing the right foods can help ease the discomfort of GERD. WHAT GENERAL GUIDELINES DO I NEED TO FOLLOW?  Choose fruits, vegetables, whole grains, low-fat dairy products, and low-fat meat, fish, and poultry.  Limit fats such as oils, salad dressings, butter, nuts, and avocado.  Keep a food diary to identify foods that cause symptoms.  Avoid foods that cause reflux. These may be different for different people.  Eat frequent small meals instead of three large meals each day.  Eat your meals slowly, in a relaxed setting.  Limit fried foods.  Cook foods using methods other than frying.  Avoid drinking alcohol.  Avoid drinking large amounts of liquids with your meals.  Avoid bending over or lying down until 2-3 hours after eating. WHAT FOODS ARE NOT RECOMMENDED? The following are some foods and drinks that may worsen your symptoms: Vegetables Tomatoes. Tomato juice. Tomato and spaghetti sauce. Chili peppers. Onion and garlic. Horseradish. Fruits Oranges, grapefruit, and lemon (fruit and juice). Meats High-fat meats, fish, and poultry. This includes hot dogs, ribs, ham, sausage, salami, and bacon. Dairy Whole milk and chocolate  milk. Sour cream. Cream. Butter. Ice cream. Cream cheese.  Beverages Coffee and tea, with or without caffeine. Carbonated beverages or energy drinks. Condiments Hot sauce. Barbecue sauce.  Sweets/Desserts Chocolate and cocoa. Donuts. Peppermint and spearmint. Fats and Oils High-fat foods, including [place] fries and potato chips. Other Vinegar. Strong spices, such as black pepper, white pepper, red pepper, cayenne, curry powder, cloves, ginger, and chili powder. The items listed above may not be a complete list of foods and beverages to avoid. Contact your dietitian for more information. Document Released: 03/08/2005 Document Revised: 03/13/2013 Document Reviewed: 01/10/2013 [facility] Patient Information 2015 [place], [place]. This information is not intended to replace advice given to you by your health care provider. Make sure you discuss any questions you have with your health care provider.

## 2013-11-19 ENCOUNTER — Ambulatory Visit (AMBULATORY_SURGERY_CENTER): Payer: 59 | Admitting: Internal Medicine

## 2013-11-19 ENCOUNTER — Encounter: Payer: Self-pay | Admitting: Internal Medicine

## 2013-11-19 VITALS — BP 115/78 | HR 62 | Temp 97.9°F | Resp 22 | Ht 62.5 in | Wt 171.0 lb

## 2013-11-19 DIAGNOSIS — K2970 Gastritis, unspecified, without bleeding: Secondary | ICD-10-CM

## 2013-11-19 DIAGNOSIS — R1013 Epigastric pain: Secondary | ICD-10-CM

## 2013-11-19 DIAGNOSIS — K2990 Gastroduodenitis, unspecified, without bleeding: Secondary | ICD-10-CM

## 2013-11-19 DIAGNOSIS — K219 Gastro-esophageal reflux disease without esophagitis: Secondary | ICD-10-CM

## 2013-11-19 DIAGNOSIS — K449 Diaphragmatic hernia without obstruction or gangrene: Secondary | ICD-10-CM

## 2013-11-19 DIAGNOSIS — R933 Abnormal findings on diagnostic imaging of other parts of digestive tract: Secondary | ICD-10-CM

## 2013-11-19 DIAGNOSIS — R0989 Other specified symptoms and signs involving the circulatory and respiratory systems: Secondary | ICD-10-CM

## 2013-11-19 DIAGNOSIS — F458 Other somatoform disorders: Secondary | ICD-10-CM

## 2013-11-19 DIAGNOSIS — K319 Disease of stomach and duodenum, unspecified: Secondary | ICD-10-CM

## 2013-11-19 MED ORDER — SODIUM CHLORIDE 0.9 % IV SOLN: 500.0000 mL | INTRAVENOUS | Status: DC

## 2013-11-19 NOTE — Progress Notes (Signed)
Called to room to assist during endoscopic procedure.  Patient ID and intended procedure confirmed with present staff. Received instructions for my participation in the procedure from the performing physician.  

## 2013-11-19 NOTE — Op Note (Signed)
[place]  [address] [place], [id number]   ENDOSCOPY PROCEDURE REPORT  PATIENT: Cleota, Pellerito  MR#: [id number] BIRTHDATE: [birth year] , 69  yrs. old GENDER: Female ENDOSCOPIST: Jerene Bears, MD PROCEDURE DATE:  11/19/2013 PROCEDURE:  EGD w/ biopsy ASA CLASS:     Class II INDICATIONS:  history of GERD.   Chest pain.   Epigastric pain. MEDICATIONS: MAC sedation, administered by CRNA and propofol (Diprivan) 160mg  IV TOPICAL ANESTHETIC: none  DESCRIPTION OF PROCEDURE: After the risks benefits and alternatives of the procedure were thoroughly explained, informed consent was obtained.  The Pentax Gastroscope [id number] endoscope was introduced through the mouth and advanced to the second portion of the duodenum. Without limitations.  The instrument was slowly withdrawn as the mucosa was fully examined.     ESOPHAGUS: A slightly irregular Z-line was observed 38 cm from the incisors.  Multiple biopsies were performed using cold forceps. There was a partial, nonobstructing Schatzki's ring at the GE junction above a very small hiatus hernia. Sample obtained to rule out Barrett's esophagus.   The mucosa of the esophagus appeared normal.  STOMACH: Moderate striped gastritis (inflammation) was found in the gastric antrum.  Multiple biopsies were performed using cold forceps.   The remainder of the gastric mucosa was unremarkable.  DUODENUM: The duodenal mucosa showed no abnormalities in the bulb and second portion of the duodenum.  Retroflexed views revealed no abnormalities.     The scope was then withdrawn from the patient and the procedure completed.  COMPLICATIONS: There were no complications. ENDOSCOPIC IMPRESSION: 1.   Slightly irregular Z-line was observed 38 cm from the incisors; multiple biopsies; very small hiatus hernia 2.   The mucosa of the esophagus appeared normal 3.   Gastritis (inflammation) was found in the gastric antrum; multiple  biopsies 4.   The duodenal mucosa showed no abnormalities in the bulb and second portion of the duodenum  RECOMMENDATIONS: 1.  Await biopsy results 2.  Continue current medications 3.  Office follow-up next available   eSigned:  Jerene Bears, MD 11/19/2013 10:23 AM   CC:The Patient and Iran Planas MD  PATIENT NAME:  Micheala, Morissette MR#: [id number]

## 2013-11-19 NOTE — Patient Instructions (Signed)
Gastritis seen today, see handout given. Biopsies taken today. Resume current medications. Office follow up next available with Dr.Pyrtle.  Thank you!  YOU HAD AN ENDOSCOPIC PROCEDURE TODAY AT [facility]: Refer to the procedure report that was given to you for any specific questions about what was found during the examination.  If the procedure report does not answer your questions, please call your gastroenterologist to clarify.  If you requested that your care partner not be given the details of your procedure findings, then the procedure report has been included in a sealed envelope for you to review at your convenience later.  YOU SHOULD EXPECT: Some feelings of bloating in the abdomen. Passage of more gas than usual.  Walking can help get rid of the air that was put into your GI tract during the procedure and reduce the bloating. If you had a lower endoscopy (such as a colonoscopy or flexible sigmoidoscopy) you may notice spotting of blood in your stool or on the toilet paper. If you underwent a bowel prep for your procedure, then you may not have a normal bowel movement for a few days.  DIET: Your first meal following the procedure should be a light meal and then it is ok to progress to your normal diet.  A half-sandwich or bowl of soup is an example of a good first meal.  Heavy or fried foods are harder to digest and may make you feel nauseous or bloated.  Likewise meals heavy in dairy and vegetables can cause extra gas to form and this can also increase the bloating.  Drink plenty of fluids but you should avoid alcoholic beverages for 24 hours.  ACTIVITY: Your care partner should take you home directly after the procedure.  You should plan to take it easy, moving slowly for the rest of the day.  You can resume normal activity the day after the procedure however you should NOT DRIVE or use heavy machinery for 24 hours (because of the sedation medicines used during the test).     SYMPTOMS TO REPORT IMMEDIATELY: A gastroenterologist can be reached at any hour.  During normal business hours, 8:30 AM to 5:00 PM Monday through Friday, call [phone].  After hours and on weekends, please call the GI answering service at [phone] who will take a message and have the physician on call contact you.   Following upper endoscopy (EGD)  Vomiting of blood or coffee ground material  New chest pain or pain under the shoulder blades  Painful or persistently difficult swallowing  New shortness of breath  Fever of 100F or higher  Black, tarry-looking stools  FOLLOW UP: If any biopsies were taken you will be contacted by phone or by letter within the next 1-3 weeks.  Call your gastroenterologist if you have not heard about the biopsies in 3 weeks.  Our staff will call the home number listed on your records the next business day following your procedure to check on you and address any questions or concerns that you may have at that time regarding the information given to you following your procedure. This is a courtesy call and so if there is no answer at the home number and we have not heard from you through the emergency physician on call, we will assume that you have returned to your regular daily activities without incident.  SIGNATURES/CONFIDENTIALITY: You and/or your care partner have signed paperwork which will be entered into your electronic medical record.  These signatures  attest to the fact that that the information above on your After Visit Summary has been reviewed and is understood.  Full responsibility of the confidentiality of this discharge information lies with you and/or your care-partner.

## 2013-11-19 NOTE — Progress Notes (Signed)
A/ox3 pleased with MAC, report to Robbin RN 

## 2013-11-20 ENCOUNTER — Telehealth: Payer: Self-pay | Admitting: *Deleted

## 2013-11-20 NOTE — Telephone Encounter (Signed)
  Follow up Call-  Call back number 11/19/2013  Post procedure Call Back phone  # [phone]  Permission to leave phone message Yes     Patient questions:  Do you have a fever, pain , or abdominal swelling? No. Pain Score  0 *  Have you tolerated food without any problems? Yes.    Have you been able to return to your normal activities? Yes.    Do you have any questions about your discharge instructions: Diet   No. Medications  No. Follow up visit  No.  Do you have questions or concerns about your Care? No.  Actions: * If pain score is 4 or above: No action needed, pain <4.

## 2013-11-28 ENCOUNTER — Other Ambulatory Visit: Payer: Self-pay | Admitting: Physician Assistant

## 2013-11-30 ENCOUNTER — Encounter: Payer: Self-pay | Admitting: Internal Medicine

## 2013-12-26 ENCOUNTER — Ambulatory Visit (INDEPENDENT_AMBULATORY_CARE_PROVIDER_SITE_OTHER): Payer: 59 | Admitting: Physician Assistant

## 2013-12-26 ENCOUNTER — Encounter: Payer: Self-pay | Admitting: Physician Assistant

## 2013-12-26 VITALS — BP 103/62 | HR 60 | Ht 62.5 in | Wt 168.0 lb

## 2013-12-26 DIAGNOSIS — E119 Type 2 diabetes mellitus without complications: Secondary | ICD-10-CM

## 2013-12-26 DIAGNOSIS — E669 Obesity, unspecified: Secondary | ICD-10-CM

## 2013-12-26 DIAGNOSIS — M6740 Ganglion, unspecified site: Secondary | ICD-10-CM

## 2013-12-26 DIAGNOSIS — M67449 Ganglion, unspecified hand: Secondary | ICD-10-CM

## 2013-12-26 DIAGNOSIS — R635 Abnormal weight gain: Secondary | ICD-10-CM

## 2013-12-26 LAB — POCT GLYCOSYLATED HEMOGLOBIN (HGB A1C): HEMOGLOBIN A1C: 5.9

## 2013-12-26 MED ORDER — AMBULATORY NON FORMULARY MEDICATION: Status: AC

## 2013-12-26 MED ORDER — ATENOLOL-CHLORTHALIDONE 100-25 MG PO TABS: 1.0000 | ORAL_TABLET | Freq: Every day | ORAL | Status: DC

## 2013-12-26 MED ORDER — DICYCLOMINE HCL 10 MG PO CAPS: ORAL_CAPSULE | ORAL | Status: DC

## 2013-12-26 MED ORDER — METFORMIN HCL 1000 MG PO TABS: ORAL_TABLET | ORAL | Status: DC

## 2013-12-26 MED ORDER — SAXAGLIPTIN HCL 5 MG PO TABS: ORAL_TABLET | ORAL | Status: DC

## 2013-12-26 MED ORDER — FUROSEMIDE 20 MG PO TABS: ORAL_TABLET | ORAL | Status: DC

## 2013-12-26 MED ORDER — NALTREXONE-BUPROPION HCL ER 8-90 MG PO TB12
ORAL_TABLET | ORAL | Status: DC
Start: 2013-12-26 — End: 2015-07-31

## 2013-12-27 ENCOUNTER — Other Ambulatory Visit: Payer: Self-pay | Admitting: Physician Assistant

## 2013-12-29 NOTE — Progress Notes (Signed)
   Subjective:    Patient ID: Diane Brooks, female    DOB: 03/17/1956, 58 y.o.   MRN: [id number]  HPI Pt presents to the clinic for her 3 month follow up.   DM- doing well. No hypoglycemic events. Fasting sugars ranging from 90-110. Trying to make diet and exercise changes. Continuing to lose weight.   She continues to have cyst right middle finger. No pain. Not tried anything. She would like it off.   As she works on diet and weight loss would like to consider medication.   Review of Systems  All other systems reviewed and are negative.      Objective:   Physical Exam  Constitutional: She is oriented to person, place, and time. She appears well-developed and well-nourished.  HENT:  Head: Normocephalic and atraumatic.  Cardiovascular: Normal rate, regular rhythm and normal heart sounds.   Pulmonary/Chest: Effort normal and breath sounds normal.  Neurological: She is alert and oriented to person, place, and time.  Skin:  Right middle cyst over DIP joint.  Psychiatric: She has a normal mood and affect. Her behavior is normal.          Assessment & Plan:  DM- .[name] Kitchen Lab Results  Component Value Date   HGBA1C 5.9 12/26/2013  doing great. Medications refilled.   Obesity/abnormal weight gain- discussed medication options. Decided to try contrave. Asked for 4 month conmittment. Discussed side effects. Follow up in 1 month.   Digital cyst- gave HO. Discussed options. Wants to try cyrotherapy.   Cryotherapy Procedure Note  Pre-operative Diagnosis: digital mucous cyst  Post-operative Diagnosis: same  Locations: right middle finger dip joint.   Indications: irritation  Anesthesia: none  Procedure Details  History of allergy to iodine: no. Pacemaker? no.  Patient informed of risks (permanent scarring, infection, light or dark discoloration, bleeding, infection, weakness, numbness and recurrence of the lesion) and benefits of the procedure and verbal informed consent  obtained.  The areas are treated with liquid nitrogen therapy, frozen until ice ball extended 3 mm beyond lesion, allowed to thaw, and treated again. The patient tolerated procedure well.  The patient was instructed on post-op care, warned that there may be blister formation, redness and pain. Recommend OTC analgesia as needed for pain.  Condition: Stable  Complications: none.  Plan: 1. Instructed to keep the area dry and covered for 24-48h and clean thereafter. 2. Warning signs of infection were reviewed.   3. Recommended that the patient use OTC acetaminophen as needed for pain.

## 2014-01-02 ENCOUNTER — Ambulatory Visit: Payer: 59

## 2014-01-09 ENCOUNTER — Ambulatory Visit: Payer: 59

## 2014-01-15 ENCOUNTER — Ambulatory Visit (INDEPENDENT_AMBULATORY_CARE_PROVIDER_SITE_OTHER): Payer: 59 | Admitting: Internal Medicine

## 2014-01-15 ENCOUNTER — Encounter: Payer: Self-pay | Admitting: Internal Medicine

## 2014-01-15 VITALS — BP 110/70 | HR 64 | Ht 62.5 in | Wt 169.5 lb

## 2014-01-15 DIAGNOSIS — K219 Gastro-esophageal reflux disease without esophagitis: Secondary | ICD-10-CM

## 2014-01-15 DIAGNOSIS — K224 Dyskinesia of esophagus: Secondary | ICD-10-CM

## 2014-01-15 MED ORDER — RANITIDINE HCL 300 MG PO TABS: 300.0000 mg | ORAL_TABLET | Freq: Every day | ORAL | Status: DC

## 2014-01-15 MED ORDER — DEXLANSOPRAZOLE 60 MG PO CPDR: 60.0000 mg | DELAYED_RELEASE_CAPSULE | Freq: Every day | ORAL | Status: DC

## 2014-01-15 NOTE — Patient Instructions (Signed)
We have sent medications to your pharmacy for you to pick up at your convenience. Please follow up in 1 year. CC:  Iran Planas MD

## 2014-01-15 NOTE — Progress Notes (Signed)
Subjective:    Patient ID: Diane Brooks, female    DOB: [birth year], 58 y.o.   MRN: [id number]  HPI  Lawrie Tunks is a 58 year old female with a past medical history of GERD, hiatal hernia, nutcracker esophagus, H. pylori negative gastritis, internal hemorrhoids, hypertension, hyperlipidemia and diabetes who is seen for follow-up. She was last seen on 11/15/2013 in the office in evaluation of epigastric pain and to evaluate abnormal gastric thickening seen by CT. She will return for upper endoscopy which was performed days later on 11/19/2013. This revealed a slightly irregular Z line and nonobstructing partial Schatzki's ring and a very small hiatal hernia. There was moderate striped antral gastritis which was biopsied and no duodenal abnormality seen. Pathology revealed reactive gastropathy with slight inflammation no H. pylori, dysplasia or malignancy. GE junction biopsies consistent with reflux no Barrett's esophagus. At that time she was continued on Dexilant 60 mg each morning and ranitidine 300 mg at bedtime. GERD diet was recommended   She reports she is feeling better and knows now that her diet is very important in helping control reflux. She is doing well with the PPI in the morning and H2 blocker at bedtime. She was recently traveling in [place] to help plan her son's wedding which is planned for 12/21/2014. While traveling her diet did change and she had several days of worsening reflux. She's not had any further chest discomfort as she was in the past. She denies nausea or vomiting. When she is having reflux symptoms she notices mild sore throat and mild burning pain near the xiphoid. Bowel movements have been regular without blood in her stool or melena.  Review of Systems  as per history of present illness, otherwise negative  Current Medications, Allergies, Past Medical History, Past Surgical History, Family History and Social History were reviewed in [employer] record.      Objective:   Physical Exam BP 110/70  Pulse 64  Ht 5' 2.5" (1.588 m)  Wt 169 lb 8 oz (76.885 kg)  BMI 30.49 kg/m2 Constitutional: Well-developed and well-nourished. No distress. HEENT: Normocephalic and atraumatic. Oropharynx is clear and moist. No oropharyngeal exudate. Conjunctivae are normal.  No scleral icterus. Cardiovascular: Normal rate, regular rhythm and intact distal pulses. No M/R/G Pulmonary/chest: Effort normal and breath sounds normal. No wheezing, rales or rhonchi. Abdominal: Soft, nontender, nondistended. Bowel sounds active throughout. There are no masses palpable. No hepatosplenomegaly. Extremities: no clubbing, cyanosis, or edema Neurological: Alert and oriented to person place and time. Psychiatric: Normal mood and affect. Behavior is normal.  CBC    Component Value Date/Time   WBC 5.1 11/09/2013 1054   RBC 4.29 11/09/2013 1054   HGB 13.2 11/09/2013 1054   HCT 37.6 11/09/2013 1054   PLT 232 11/09/2013 1054   MCV 87.6 11/09/2013 1054   MCH 30.8 11/09/2013 1054   MCHC 35.1 11/09/2013 1054   RDW 13.7 11/09/2013 1054   LYMPHSABS 2.1 11/09/2013 1054   MONOABS 0.4 11/09/2013 1054   EOSABS 0.1 11/09/2013 1054   BASOSABS 0.0 11/09/2013 1054    CMP     Component Value Date/Time   NA 141 11/09/2013 1054   K 4.1 11/09/2013 1054   CL 102 11/09/2013 1054   CO2 30 11/09/2013 1054   GLUCOSE 109* 11/09/2013 1054   BUN 21 11/09/2013 1054   CREATININE 0.77 11/09/2013 1054   CALCIUM 9.7 11/09/2013 1054   PROT 7.0 11/09/2013 1054   ALBUMIN 4.5 11/09/2013 1054   AST  18 11/09/2013 1054   ALT 19 11/09/2013 1054   ALKPHOS 40 11/09/2013 1054   BILITOT 0.3 11/09/2013 1054   GFRNONAA 87 11/09/2013 1054   GFRAA >89 11/09/2013 1054    Lipase     Component Value Date/Time   LIPASE 23 11/09/2013 1054        Assessment & Plan:  31-year-old female with a past medical history of GERD, hiatal hernia, nutcracker esophagus, H. pylori negative gastritis, internal hemorrhoids,  hypertension, hyperlipidemia and diabetes who is seen for follow-up.  1. GERD/nutcracker esophagus  -- fortunately her chest pain has resolved with better control of her reflux. We discussed calcium channel blocker for nutcracker esophagus, but I will defer starting this medication at this time given the overall improvement in her chest pain. We discussed the importance of the antireflux diet and she will plan to continue this diet going forward. We will continue Dexilant 60 mg each morning and ranitidine 300 mg at bedtime. I've asked that she notify me if she develop worsening heartburn despite these medications or other GI complaints such as nausea, vomiting or recurrent chest pain. She voices understanding   She will return in one year, sooner if necessary

## 2014-01-21 ENCOUNTER — Encounter: Payer: Self-pay | Admitting: Internal Medicine

## 2014-02-28 ENCOUNTER — Other Ambulatory Visit: Payer: Self-pay | Admitting: Physician Assistant

## 2014-03-01 ENCOUNTER — Other Ambulatory Visit: Payer: Self-pay | Admitting: Physician Assistant

## 2014-03-13 ENCOUNTER — Other Ambulatory Visit: Payer: Self-pay | Admitting: Physician Assistant

## 2014-03-29 ENCOUNTER — Ambulatory Visit: Payer: 59 | Admitting: Physician Assistant

## 2014-04-20 ENCOUNTER — Other Ambulatory Visit: Payer: Self-pay | Admitting: Physician Assistant

## 2014-07-11 ENCOUNTER — Encounter: Payer: Self-pay | Admitting: Physician Assistant

## 2014-12-31 LAB — HM DIABETES EYE EXAM

## 2015-07-31 ENCOUNTER — Ambulatory Visit (INDEPENDENT_AMBULATORY_CARE_PROVIDER_SITE_OTHER): Payer: BLUE CROSS/BLUE SHIELD | Admitting: Physician Assistant

## 2015-07-31 ENCOUNTER — Encounter: Payer: Self-pay | Admitting: Physician Assistant

## 2015-07-31 VITALS — BP 112/73 | HR 69 | Ht 62.5 in | Wt 180.0 lb

## 2015-07-31 DIAGNOSIS — J302 Other seasonal allergic rhinitis: Secondary | ICD-10-CM | POA: Diagnosis not present

## 2015-07-31 DIAGNOSIS — H1013 Acute atopic conjunctivitis, bilateral: Secondary | ICD-10-CM | POA: Diagnosis not present

## 2015-07-31 MED ORDER — METHYLPREDNISOLONE 4 MG PO TBPK: ORAL_TABLET | ORAL | Status: DC

## 2015-07-31 MED ORDER — AZELASTINE HCL 0.05 % OP SOLN: 1.0000 [drp] | Freq: Two times a day (BID) | OPHTHALMIC | Status: DC

## 2015-07-31 NOTE — Patient Instructions (Signed)
Allergic Conjunctivitis Allergic conjunctivitis is inflammation of the clear membrane that covers the white part of your eye and the inner surface of your eyelid (conjunctiva), and it is caused by allergies. The blood vessels in the conjunctiva become inflamed, and this causes the eye to become red or pink, and it often causes itchiness in the eye. Allergic conjunctivitis cannot be spread by one person to another person (noncontagious). CAUSES This condition is caused by an allergic reaction. Common causes of an allergic reaction (allergens) include:  Dust.  Pollen.  Mold.  Animal dander or secretions. RISK FACTORS This condition is more likely to develop if you are exposed to high levels of allergens that cause the allergic reaction. This might include being outdoors when air pollen levels are high or being around animals that you are allergic to. SYMPTOMS Symptoms of this condition may include:  Eye redness.  Tearing of the eyes.  Watery eyes.  Itchy eyes.  Burning feeling in the eyes.  Clear drainage from the eyes.  Swollen eyelids. DIAGNOSIS This condition may be diagnosed by medical history and physical exam. If you have drainage from your eyes, it may be tested to rule out other causes of conjunctivitis. TREATMENT Treatment for this condition often includes medicines. These may be eye drops, ointments, or oral medicines. They may be prescription medicines or over-the-counter medicines. HOME CARE INSTRUCTIONS  Take or apply medicines only as directed by your health care provider.  Do not touch or rub your eyes.  Do not wear contact lenses until the inflammation is gone. Wear glasses instead.  Do not wear eye makeup until the inflammation is gone.  Apply a cool, clean washcloth to your eye for 10-20 minutes, 3-4 times a day.  Try to avoid whatever allergen is causing the allergic reaction. SEEK MEDICAL CARE IF:  Your symptoms get worse.  You have pus draining  from your eye.  You have new symptoms.  You have a fever.   This information is not intended to replace advice given to you by your health care provider. Make sure you discuss any questions you have with your health care provider.   Document Released: 05/29/2002 Document Revised: 03/29/2014 Document Reviewed: 12/18/2013 Elsevier Interactive Patient Education 2016 Else[employer] Inc.  

## 2015-07-31 NOTE — Progress Notes (Addendum)
   Subjective:    Patient ID: Diane Brooks, female    DOB: [birth year], 60 y.o.   MRN: [id number]  HPI Patient is here today complaining of itchy eyes and ears for the past 6 days. She states eyes are only red after itching. She does report discharge and crusting from both eyes that is worse in the morning. She has been taking Claritin and using Veramyst nasal spray with no relief.    Review of Systems  All other systems reviewed and are negative.      Objective:   Physical Exam  Constitutional: She is oriented to person, place, and time. She appears well-developed and well-nourished.  HENT:  Head: Normocephalic and atraumatic.  Right Ear: External ear normal.  Left Ear: External ear normal.  Nose: Nose normal.  Mouth/Throat: Oropharynx is clear and moist.  Eyes: Right eye exhibits no discharge. Left eye exhibits no discharge.  Erythematous inner eyelid bilaterally.   Cardiovascular: Normal rate, regular rhythm and normal heart sounds.   Pulmonary/Chest: Effort normal and breath sounds normal.  Lymphadenopathy:    She has no cervical adenopathy.  Neurological: She is alert and oriented to person, place, and time.  Skin: Skin is warm and dry.  Psychiatric: She has a normal mood and affect. Her behavior is normal. Thought content normal.          Assessment & Plan:  1. Seasonal allergies/bilateral allergic conjunctivitis- Patient presents with complaints of bilateral itchy eyes and ears. Will send Prednisone dose pack and Optivar to relieve symptoms. Recommend patient continue using OTC allergy medicine and nasal spray to treat allergies.

## 2015-08-06 ENCOUNTER — Ambulatory Visit (INDEPENDENT_AMBULATORY_CARE_PROVIDER_SITE_OTHER): Payer: BLUE CROSS/BLUE SHIELD | Admitting: Physician Assistant

## 2015-08-06 ENCOUNTER — Encounter: Payer: Self-pay | Admitting: Physician Assistant

## 2015-08-06 VITALS — BP 128/76 | HR 67 | Wt 181.0 lb

## 2015-08-06 DIAGNOSIS — E785 Hyperlipidemia, unspecified: Secondary | ICD-10-CM

## 2015-08-06 DIAGNOSIS — E119 Type 2 diabetes mellitus without complications: Secondary | ICD-10-CM | POA: Diagnosis not present

## 2015-08-06 DIAGNOSIS — K2100 Gastro-esophageal reflux disease with esophagitis, without bleeding: Secondary | ICD-10-CM

## 2015-08-06 DIAGNOSIS — I10 Essential (primary) hypertension: Secondary | ICD-10-CM

## 2015-08-06 DIAGNOSIS — Z79899 Other long term (current) drug therapy: Secondary | ICD-10-CM

## 2015-08-06 DIAGNOSIS — E669 Obesity, unspecified: Secondary | ICD-10-CM

## 2015-08-06 LAB — POCT UA - MICROALBUMIN

## 2015-08-06 LAB — POCT GLYCOSYLATED HEMOGLOBIN (HGB A1C): Hemoglobin A1C: 6.3

## 2015-08-06 MED ORDER — ATORVASTATIN CALCIUM 10 MG PO TABS: 10.0000 mg | ORAL_TABLET | Freq: Every day | ORAL | Status: DC

## 2015-08-06 MED ORDER — METFORMIN HCL 1000 MG PO TABS: ORAL_TABLET | ORAL | Status: DC

## 2015-08-06 MED ORDER — ATENOLOL 100 MG PO TABS: 100.0000 mg | ORAL_TABLET | Freq: Every day | ORAL | Status: DC

## 2015-08-06 MED ORDER — AMBULATORY NON FORMULARY MEDICATION: Status: AC

## 2015-08-06 MED ORDER — ESOMEPRAZOLE MAGNESIUM 40 MG PO CPDR: 40.0000 mg | DELAYED_RELEASE_CAPSULE | Freq: Every day | ORAL | Status: DC

## 2015-08-06 MED ORDER — HYDROCHLOROTHIAZIDE 25 MG PO TABS: 25.0000 mg | ORAL_TABLET | Freq: Every day | ORAL | Status: DC

## 2015-08-06 MED ORDER — SAXAGLIPTIN HCL 2.5 MG PO TABS: 2.5000 mg | ORAL_TABLET | Freq: Every day | ORAL | Status: DC

## 2015-08-06 NOTE — Patient Instructions (Signed)
Phentermine Belviq Saxenda

## 2015-08-07 ENCOUNTER — Encounter: Payer: Self-pay | Admitting: Physician Assistant

## 2015-08-07 NOTE — Progress Notes (Signed)
   Subjective:    Patient ID: Diane Brooks, female    DOB: April 17, 1955, 60 y.o.   MRN: [id number]  HPI Pt is a 60 yo female who presents to the clinic to get medication refills. She was without insurance for last year. She is ready to get back on track and get re-established.   Pt doing well on medications. She denies any SOB, CP, palpitations, headaches or dizziness.   She is checking her glucose every other day and around 90-110 in am fasting. Denies any hypoglycemic events. No vision changes or open wounds or sores. Doing well. She is discouraged about her weight. She would like to lose some weight.    Review of Systems  All other systems reviewed and are negative.      Objective:   Physical Exam  Constitutional: She is oriented to person, place, and time. She appears well-developed and well-nourished.  HENT:  Head: Normocephalic and atraumatic.  Cardiovascular: Normal rate, regular rhythm and normal heart sounds.   Pulmonary/Chest: Effort normal and breath sounds normal.  Neurological: She is alert and oriented to person, place, and time.  Skin: Skin is dry.  Psychiatric: She has a normal mood and affect. Her behavior is normal.          Assessment & Plan:  Hyperlipidemia- refilled lipitor. Lipid panel ordered.   GERD- nexium refilled.   HTN- refilled atenolol.   DM- .[name] Kitchen Results for orders placed or performed in visit on 08/06/15  POCT HgB A1C  Result Value Ref Range   Hemoglobin A1C 6.3   POCT UA - Microalbumin  Result Value Ref Range   Microalbumin Ur, POC 30mg /l mg/L   Creatinine, POC 300mg /dl mg/dL   Albumin/Creatinine Ratio, Urine, POC 30mg /g    A1C 6.3. Looks good.  Refilled metformin and onglyza.  Foot exam normal today.  Reminded of need for eye exam.  Encouraged to continue to work on diabetic diet.  Follow up in 3 months.   Obese- discussed weight loss medications. Pt will talk with insurance company and see what they covered. Discussed  phentermine vs belviq vs saxenda. Encouraged pt to start walking daily and monitoring calories to about 1500-1800 daily.   Discussed with patient need for CPE.

## 2015-09-27 LAB — COMPLETE METABOLIC PANEL WITH GFR
ALBUMIN: 4.4 g/dL (ref 3.6–5.1)
ALK PHOS: 55 U/L (ref 33–130)
ALT: 27 U/L (ref 6–29)
AST: 23 U/L (ref 10–35)
BILIRUBIN TOTAL: 0.4 mg/dL (ref 0.2–1.2)
BUN: 20 mg/dL (ref 7–25)
CO2: 28 mmol/L (ref 20–31)
Calcium: 9.6 mg/dL (ref 8.6–10.4)
Chloride: 100 mmol/L (ref 98–110)
Creat: 0.73 mg/dL (ref 0.50–1.05)
GFR, Est African American: >89 mL/min (ref >=60)
GLUCOSE: 130 mg/dL — AB (ref 65–99)
Potassium: 4.2 mmol/L (ref 3.5–5.3)
SODIUM: 141 mmol/L (ref 135–146)
TOTAL PROTEIN: 6.8 g/dL (ref 6.1–8.1)

## 2015-09-27 LAB — LIPID PANEL
Cholesterol: 172 mg/dL (ref 125–200)
HDL: 63 mg/dL (ref >=46)
LDL Cholesterol: 76 mg/dL (ref <130)
Total CHOL/HDL Ratio: 2.7 Ratio (ref <=5.0)
Triglycerides: 164 mg/dL — ABNORMAL HIGH (ref <150)
VLDL: 33 mg/dL — ABNORMAL HIGH (ref <30)

## 2015-10-10 ENCOUNTER — Ambulatory Visit (INDEPENDENT_AMBULATORY_CARE_PROVIDER_SITE_OTHER): Payer: BLUE CROSS/BLUE SHIELD | Admitting: Physician Assistant

## 2015-10-10 ENCOUNTER — Encounter: Payer: Self-pay | Admitting: Physician Assistant

## 2015-10-10 VITALS — BP 106/59 | HR 68 | Ht 62.5 in | Wt 183.0 lb

## 2015-10-10 DIAGNOSIS — M791 Myalgia: Secondary | ICD-10-CM | POA: Diagnosis not present

## 2015-10-10 DIAGNOSIS — IMO0001 Reserved for inherently not codable concepts without codable children: Secondary | ICD-10-CM

## 2015-10-10 DIAGNOSIS — M609 Myositis, unspecified: Secondary | ICD-10-CM

## 2015-10-10 DIAGNOSIS — M62830 Muscle spasm of back: Secondary | ICD-10-CM | POA: Diagnosis not present

## 2015-10-10 MED ORDER — KETOROLAC TROMETHAMINE 60 MG/2ML IM SOLN
60.0000 mg | Freq: Once | INTRAMUSCULAR | Status: AC
Start: 2015-10-10 — End: 2015-10-10
  Administered 2015-10-10: 60 mg via INTRAMUSCULAR

## 2015-10-10 MED ORDER — MELOXICAM 15 MG PO TABS: 15.0000 mg | ORAL_TABLET | Freq: Every day | ORAL | Status: DC

## 2015-10-10 MED ORDER — CYCLOBENZAPRINE HCL 10 MG PO TABS: 10.0000 mg | ORAL_TABLET | Freq: Three times a day (TID) | ORAL | Status: DC | PRN

## 2015-10-10 NOTE — Progress Notes (Addendum)
   Subjective:    Patient ID: Diane Brooks, female    DOB: 10/07/1955, 60 y.o.   MRN: [id number]  HPI Pt is a 60 yo female that presents with left sided myalgias for two weeks. She states it has been constant and is worse as the day progresses. She recalls an increase of activity in the last three weeks but no specific injury or trauma. She has tried APAP and IBU without relief. She reports a previous episode one year ago. She states associated headache, blurry vision, photophobia and cramps in the fingers. She has a history of migraines but states this "feels different."  She denies numbness/tingling, fall, V/N, unsteady gait, CP and abdominal pain.    Review of Systems  All other systems reviewed and are negative.      Objective:   Physical Exam  Constitutional: She is oriented to person, place, and time. She appears well-developed and well-nourished.  HENT:  Head: Normocephalic and atraumatic.  Tenderness to palpation over the left forehead and face  Eyes: Pupils are equal, round, and reactive to light. No scleral icterus.  Cardiovascular: Normal rate, regular rhythm and normal heart sounds.   Pulmonary/Chest: Effort normal and breath sounds normal.  Musculoskeletal: Normal range of motion.  Good gait and station.  No tenderness over the spine with palpation.  Tenderness to the left paraspinous muscles, left arm, and left leg with palpation.  Neurological: She is alert and oriented to person, place, and time.  CN II-12 grossly intact  Psychiatric: She has a normal mood and affect. Her behavior is normal.       Assessment & Plan:  Myalgia/ myositis/muscle spasm- Given IM 60 mg Toradol in office today to help decrease inflammation. Encouraged to alternate heat/ice and to use Biofreeze on problem areas as needed. Given flexeril and mobic. Follow up as needed. If not improving need to consider trial off statin and check cmp.

## 2015-10-10 NOTE — Patient Instructions (Signed)
Heat, ice and biofreeze for muscle aches.  Toradol given in office today.  Start [employer].  Start muscle relaxer up to three times a day.

## 2015-10-16 ENCOUNTER — Ambulatory Visit (INDEPENDENT_AMBULATORY_CARE_PROVIDER_SITE_OTHER): Payer: BLUE CROSS/BLUE SHIELD | Admitting: Sports Medicine

## 2015-10-16 ENCOUNTER — Encounter: Payer: Self-pay | Admitting: Sports Medicine

## 2015-10-16 DIAGNOSIS — G43009 Migraine without aura, not intractable, without status migrainosus: Secondary | ICD-10-CM | POA: Diagnosis not present

## 2015-10-16 LAB — CBC WITH DIFFERENTIAL/PLATELET
Basophils Absolute: 0 {cells}/uL (ref 0–200)
Basophils Relative: 0 %
Eosinophils Absolute: 120 cells/uL (ref 15–500)
Eosinophils Relative: 2 %
HCT: 39.6 % (ref 35.0–45.0)
Hemoglobin: 13.2 g/dL (ref 11.7–15.5)
Lymphocytes Relative: 34 %
Lymphs Abs: 2040 {cells}/uL (ref 850–3900)
MCH: 29.9 pg (ref 27.0–33.0)
MCHC: 33.3 g/dL (ref 32.0–36.0)
MCV: 89.6 fL (ref 80.0–100.0)
MPV: 9.9 fL (ref 7.5–12.5)
Monocytes Absolute: 420 {cells}/uL (ref 200–950)
Monocytes Relative: 7 %
Neutro Abs: 3420 {cells}/uL (ref 1500–7800)
Neutrophils Relative %: 57 %
Platelets: 239 10*3/uL (ref 140–400)
RBC: 4.42 MIL/uL (ref 3.80–5.10)
RDW: 13.9 % (ref 11.0–15.0)
WBC: 6 10*3/uL (ref 3.8–10.8)

## 2015-10-16 MED ORDER — ONDANSETRON 8 MG PO TBDP: 8.0000 mg | ORAL_TABLET | Freq: Three times a day (TID) | ORAL | 3 refills | Status: DC | PRN

## 2015-10-16 MED ORDER — PROMETHAZINE HCL 25 MG/ML IJ SOLN
25.0000 mg | Freq: Once | INTRAMUSCULAR | Status: AC
  Administered 2015-10-16: 25 mg via INTRAMUSCULAR

## 2015-10-16 MED ORDER — KETOROLAC TROMETHAMINE 30 MG/ML IJ SOLN
30.0000 mg | Freq: Once | INTRAMUSCULAR | Status: AC
  Administered 2015-10-16: 30 mg via INTRAMUSCULAR

## 2015-10-16 MED ORDER — DEXAMETHASONE SODIUM PHOSPHATE 4 MG/ML IJ SOLN
4.0000 mg | Freq: Once | INTRAMUSCULAR | Status: AC
  Administered 2015-10-16: 4 mg via INTRAMUSCULAR

## 2015-10-16 MED ORDER — RIZATRIPTAN BENZOATE 10 MG PO TBDP: 10.0000 mg | ORAL_TABLET | ORAL | 3 refills | Status: DC | PRN

## 2015-10-16 NOTE — Assessment & Plan Note (Signed)
Classic migraine symptoms. Phenergan, Toradol, Decadron intramuscular, adding Zofran for nausea. Relpax for migraine abortion. She is also having some epigastric pain, diarrhea, likely has concurrent viral gastroenteritis. This will simply have to run its course, no warning symptoms or signs. Checking some routine blood work.

## 2015-10-16 NOTE — Addendum Note (Signed)
Addended by: Elizabeth Sauer on: 10/16/2015 04:57 PM   Modules accepted: Orders

## 2015-10-16 NOTE — Progress Notes (Signed)
  Subjective:    CC: Headache, nausea  HPI: For the past couple of days this pleasant 60 year old female has had a left-sided throbbing headache with photophobia, phonophobia, nausea. Some pain on the left side of her body, mild epigastric pain. She has been very stressed out and busy with her husband's foot issue, he had a foreign body that required 3 different doctor visits to remove, recently had surgery and is admitted in the hospital. No nausea, vomiting, hematochezia, melena, hematemesis.  Past medical history, Surgical history, Family history not pertinant except as noted below, Social history, Allergies, and medications have been entered into the medical record, reviewed, and no changes needed.   Review of Systems: No fevers, chills, night sweats, weight loss, chest pain, or shortness of breath.   Objective:    General: Well Developed, well nourished, and in no acute distress.  Neuro: Alert and oriented x3, extra-ocular muscles intact, sensation grossly intact. Cranial nerves II through XII are intact, motor, sensory, coordinative functions are all intact. HEENT: Normocephalic, atraumatic, pupils equal round reactive to light, neck supple, no masses, no lymphadenopathy, thyroid nonpalpable.  Skin: Warm and dry, no rashes. Cardiac: Regular rate and rhythm, no murmurs rubs or gallops, no lower extremity edema.  Respiratory: Clear to auscultation bilaterally. Not using accessory muscles, speaking in full sentences. Abdomen: Soft, only minimally tender in the epigastrium, nondistended, normal bowel sounds, no palpable masses, no guarding, rigidity, rebound tenderness.  Toradol 30, Decadron 4, Phenergan 25 given intramuscular  Impression and Recommendations:    Migraine headache without aura Classic migraine symptoms. Phenergan, Toradol, Decadron intramuscular, adding Zofran for nausea. Relpax for migraine abortion. She is also having some epigastric pain, diarrhea, likely has  concurrent viral gastroenteritis. This will simply have to run its course, no warning symptoms or signs. Checking some routine blood work.

## 2015-10-17 ENCOUNTER — Other Ambulatory Visit: Payer: Self-pay | Admitting: Physician Assistant

## 2015-10-17 LAB — URINALYSIS
Bilirubin Urine: NEGATIVE
Glucose, UA: NEGATIVE
Hgb urine dipstick: NEGATIVE
Ketones, ur: NEGATIVE
Leukocytes, UA: NEGATIVE
Nitrite: NEGATIVE
Protein, ur: NEGATIVE
Specific Gravity, Urine: 1.021 (ref 1.001–1.035)
pH: 5 (ref 5.0–8.0)

## 2015-10-17 LAB — COMPREHENSIVE METABOLIC PANEL WITH GFR
ALT: 29 U/L (ref 6–29)
Calcium: 9.8 mg/dL (ref 8.6–10.4)
Creat: 0.84 mg/dL (ref 0.50–1.05)
Total Bilirubin: 0.4 mg/dL (ref 0.2–1.2)

## 2015-10-17 LAB — COMPREHENSIVE METABOLIC PANEL
AST: 22 U/L (ref 10–35)
Albumin: 4.4 g/dL (ref 3.6–5.1)
Alkaline Phosphatase: 49 U/L (ref 33–130)
BUN: 18 mg/dL (ref 7–25)
CO2: 29 mmol/L (ref 20–31)
Chloride: 102 mmol/L (ref 98–110)
Glucose, Bld: 106 mg/dL — ABNORMAL HIGH (ref 65–99)
Potassium: 4.1 mmol/L (ref 3.5–5.3)
Sodium: 142 mmol/L (ref 135–146)
Total Protein: 6.9 g/dL (ref 6.1–8.1)

## 2015-10-17 LAB — AMYLASE: Amylase: 23 U/L (ref 0–105)

## 2015-10-17 LAB — LIPASE: Lipase: 25 U/L (ref 7–60)

## 2015-10-22 ENCOUNTER — Ambulatory Visit (INDEPENDENT_AMBULATORY_CARE_PROVIDER_SITE_OTHER): Payer: BLUE CROSS/BLUE SHIELD | Admitting: Physician Assistant

## 2015-10-22 ENCOUNTER — Encounter: Payer: Self-pay | Admitting: Physician Assistant

## 2015-10-22 ENCOUNTER — Ambulatory Visit (INDEPENDENT_AMBULATORY_CARE_PROVIDER_SITE_OTHER): Payer: BLUE CROSS/BLUE SHIELD

## 2015-10-22 VITALS — BP 98/58 | HR 66 | Ht 62.5 in | Wt 181.0 lb

## 2015-10-22 DIAGNOSIS — M5136 Other intervertebral disc degeneration, lumbar region: Secondary | ICD-10-CM

## 2015-10-22 DIAGNOSIS — M549 Dorsalgia, unspecified: Secondary | ICD-10-CM

## 2015-10-22 DIAGNOSIS — M542 Cervicalgia: Secondary | ICD-10-CM

## 2015-10-22 DIAGNOSIS — M4802 Spinal stenosis, cervical region: Secondary | ICD-10-CM | POA: Diagnosis not present

## 2015-10-22 DIAGNOSIS — G43011 Migraine without aura, intractable, with status migrainosus: Secondary | ICD-10-CM | POA: Diagnosis not present

## 2015-10-22 DIAGNOSIS — M5442 Lumbago with sciatica, left side: Secondary | ICD-10-CM | POA: Diagnosis not present

## 2015-10-22 DIAGNOSIS — M4604 Spinal enthesopathy, thoracic region: Secondary | ICD-10-CM | POA: Diagnosis not present

## 2015-10-22 MED ORDER — MELOXICAM 15 MG PO TABS: 15.0000 mg | ORAL_TABLET | Freq: Every day | ORAL | 1 refills | Status: DC

## 2015-10-22 MED ORDER — ORPHENADRINE CITRATE ER 100 MG PO TB12: 100.0000 mg | ORAL_TABLET | Freq: Two times a day (BID) | ORAL | 3 refills | Status: DC

## 2015-10-22 MED ORDER — PREDNISONE 10 MG (21) PO TBPK: ORAL_TABLET | ORAL | 0 refills | Status: DC

## 2015-10-22 MED ORDER — PREDNISONE 20 MG PO TABS: ORAL_TABLET | ORAL | 0 refills | Status: DC

## 2015-10-22 NOTE — Patient Instructions (Signed)
Start prednisone pack and norflex.  Start mobic after prednisone.  Get xrays today.  Get massage of back muscle.  Use biofreeze on muscle.

## 2015-10-22 NOTE — Progress Notes (Signed)
   Subjective:    Patient ID: Diane Brooks, female    DOB: 13-May-1955, 60 y.o.   MRN: [id number]  HPI  Patient is a 60 year old female who presents to the clinic with left sided migraine, left-sided upper back pain, left-sided mid back pain and left-sided lower back pain. She was seen on 10/16/2015 for migraine. She was given injection of Decadron, Toradol, Phenergan. She said symptoms resolve for 24-48 hours and then started to come back. She feels like there is a dull pressure in her left eye and side of her head. She just feels achy all over. The Flexeril makes her really drowsy. She is taking the meloxicam with little benefit. She denies any recent injury.     Review of Systems  All other systems reviewed and are negative.      Objective:   Physical Exam  Constitutional: She is oriented to person, place, and time. She appears well-developed and well-nourished.  HENT:  Head: Normocephalic and atraumatic.  Eyes: Conjunctivae and EOM are normal. Pupils are equal, round, and reactive to light.  Cardiovascular: Normal rate, regular rhythm and normal heart sounds.   Pulmonary/Chest: Effort normal and breath sounds normal.  Musculoskeletal:  Normal ROM at waist, upper and lower extremities.  Tenderness over spine and paraspinous muscles of cervical, mid back, lumbar. More left sided than right.  Negative straight leg test.  Bilateral hand grip 5/5.    Neurological: She is alert and oriented to person, place, and time.  Psychiatric: She has a normal mood and affect. Her behavior is normal.          Assessment & Plan:  Intractable migraine without aura with status migrainosus/neck pain/left sided sciatica/mid back pain- xrays ordered. Prednisone taper given. Norflex bid given. Discussed migraine preventative. Heat and ice. Consider massage. If not improving may need to consider PT and preventative for migraine. I feel like her musculoskelatal pain could be triggering migraine.

## 2015-10-24 ENCOUNTER — Other Ambulatory Visit: Payer: Self-pay | Admitting: Physician Assistant

## 2015-10-24 ENCOUNTER — Encounter: Payer: Self-pay | Admitting: Physician Assistant

## 2015-10-24 DIAGNOSIS — R937 Abnormal findings on diagnostic imaging of other parts of musculoskeletal system: Secondary | ICD-10-CM

## 2015-10-24 DIAGNOSIS — M542 Cervicalgia: Secondary | ICD-10-CM | POA: Insufficient documentation

## 2015-10-24 DIAGNOSIS — M549 Dorsalgia, unspecified: Secondary | ICD-10-CM | POA: Insufficient documentation

## 2015-10-24 DIAGNOSIS — M5442 Lumbago with sciatica, left side: Secondary | ICD-10-CM | POA: Insufficient documentation

## 2015-10-27 ENCOUNTER — Encounter: Payer: Self-pay | Admitting: Family Medicine

## 2015-10-27 ENCOUNTER — Ambulatory Visit (INDEPENDENT_AMBULATORY_CARE_PROVIDER_SITE_OTHER): Payer: BLUE CROSS/BLUE SHIELD | Admitting: Family Medicine

## 2015-10-27 VITALS — BP 147/86 | HR 60 | Wt 178.0 lb

## 2015-10-27 DIAGNOSIS — A499 Bacterial infection, unspecified: Secondary | ICD-10-CM

## 2015-10-27 DIAGNOSIS — J329 Chronic sinusitis, unspecified: Secondary | ICD-10-CM

## 2015-10-27 DIAGNOSIS — B9689 Other specified bacterial agents as the cause of diseases classified elsewhere: Secondary | ICD-10-CM

## 2015-10-27 MED ORDER — AMOXICILLIN-POT CLAVULANATE 500-125 MG PO TABS: ORAL_TABLET | ORAL | 0 refills | Status: AC

## 2015-10-27 NOTE — Progress Notes (Signed)
CC: Diane Brooks is a 60 y.o. female is here for Facial Swelling   Subjective: HPI:  For the past 2 days she's had pressure and redness in her cheeks, it radiates into the upper molars. No benefit from ibuprofen. The only recent change to her medications was a prednisone taper however she recalls being on prednisone in the past without any of the side effects. She denies any nasal congestion, sore throat, cough, sneezing, fevers or chills. She tells me she feels this constant sensation of pressure that is pushing on her cheeks. Denies headache other than that described above. She denies any skin changes elsewhere. No confusion or rash other than redness in the face   Review Of Systems Outlined In HPI  Past Medical History:  Diagnosis Date  . Diabetes mellitus without complication ([place])   . Hyperlipidemia   . Hypertension     Past Surgical History:  Procedure Laterality Date  . BREAST ENHANCEMENT SURGERY    . ESOPHAGEAL MANOMETRY N/A 07/02/2013   Procedure: ESOPHAGEAL MANOMETRY (EM);  Surgeon: Jerene Bears, MD;  Location: [facility];  Service: Gastroenterology;  Laterality: N/A;  . TONSILECTOMY/ADENOIDECTOMY WITH MYRINGOTOMY    . TUBAL LIGATION    . tummy tuck     Family History  Problem Relation Age of Onset  . Hyperlipidemia Mother   . Diabetes Father   . Hyperlipidemia Father   . Hypertension Father   . Diabetes Maternal Grandfather   . Hyperlipidemia Maternal Grandfather   . Diabetes Paternal Grandfather   . Hyperlipidemia Paternal Grandfather     Social History   Social History  . Marital status: Married    Spouse name: N/A  . Number of children: 2  . Years of education: N/A   Occupational History  . Retired    Social History Main Topics  . Smoking status: Former Smoker    Quit date: 03/23/1991  . Smokeless tobacco: Never Used  . Alcohol use No  . Drug use: No  . Sexual activity: Yes    Birth control/ protection: Surgical   Other Topics Concern  . Not on  file   Social History Narrative  . No narrative on file     Objective: BP (!) 147/86   Pulse 60   Wt 178 lb (80.7 kg)   BMI 32.04 kg/m   General: Alert and Oriented, No Acute Distress HEENT: Pupils equal, round, reactive to light. Conjunctivae clear.  External ears unremarkable, canals clear with intact TMs with appropriate landmarks.  Middle ear appears open without effusion. Pink inferior turbinates.  Moist mucous membranes, pharynx without inflammation nor lesions.  Neck supple without palpable lymphadenopathy nor abnormal masses. Lungs: Clear, comfortable work of breathing Cardiac: Regular rate and rhythm.  Extremities: No peripheral edema.  Strong peripheral pulses.  Mental Status: No depression, anxiety, nor agitation. Skin: Warm and dry.  Assessment & Plan: Diane Brooks was seen today for facial swelling.  Diagnoses and all orders for this visit:  Bacterial sinusitis -     amoxicillin-clavulanate (AUGMENTIN) 500-125 MG tablet; Take one by mouth every 8 hours for ten total days.   Possible sinus infection therefore start nasal saline washes and Augmentin.Signs and symptoms requring emergent/urgent reevaluation were discussed with the patient.  Return if symptoms worsen or fail to improve.

## 2015-11-07 ENCOUNTER — Ambulatory Visit: Payer: BLUE CROSS/BLUE SHIELD | Admitting: Physician Assistant

## 2015-11-11 ENCOUNTER — Ambulatory Visit: Payer: BLUE CROSS/BLUE SHIELD | Admitting: Physician Assistant

## 2015-11-11 ENCOUNTER — Encounter: Payer: Self-pay | Admitting: Physician Assistant

## 2015-11-11 ENCOUNTER — Ambulatory Visit (INDEPENDENT_AMBULATORY_CARE_PROVIDER_SITE_OTHER): Payer: BLUE CROSS/BLUE SHIELD | Admitting: Physician Assistant

## 2015-11-11 VITALS — BP 129/49 | HR 64 | Ht 62.5 in | Wt 181.0 lb

## 2015-11-11 DIAGNOSIS — H8112 Benign paroxysmal vertigo, left ear: Secondary | ICD-10-CM | POA: Diagnosis not present

## 2015-11-11 DIAGNOSIS — M62830 Muscle spasm of back: Secondary | ICD-10-CM

## 2015-11-11 DIAGNOSIS — M654 Radial styloid tenosynovitis [de Quervain]: Secondary | ICD-10-CM

## 2015-11-11 MED ORDER — CELECOXIB 200 MG PO CAPS: 200.0000 mg | ORAL_CAPSULE | Freq: Two times a day (BID) | ORAL | 2 refills | Status: DC

## 2015-11-11 NOTE — Patient Instructions (Addendum)
Stop mobic and start celebrex   Do epley manuevers three times a day for next week.  If no improvement need to do PT to help with dizziness.   [facility].   Diane Brooks Disease Diane Brooks disease is inflammation of the tendon on the thumb side of the wrist. Tendons are cords of tissue that connect bones to muscles. The tendons in your hand pass through a tunnel, or sheath. A slippery layer of tissue (synovium) lets the tendons move smoothly in the sheath. With de Quervain disease, the sheath swells or thickens, causing friction and pain. The condition is also called de Quervain tendinosis and de Quervain syndrome. It occurs most often in women who are 49-46 years old. CAUSES  The exact cause of de Quervain disease is not known. It may result from:   Overusing your hands, especially with repetitive motions that involve twisting your hand or using a forceful grip.  Pregnancy.  Rheumatoid disease. RISK FACTORS You may have a greater risk for de Quervain disease if you:  Are a middle-aged woman.  Are pregnant.  Have rheumatoid arthritis.  Have diabetes.  Use your hands far more than normal, especially with a tight grip or excessive twisting. SIGNS AND SYMPTOMS Pain on the thumb side of your wrist is the main symptom of de Quervain disease. Other signs and symptoms include:  Pain that gets worse when you grasp something or turn your wrist.  Pain that extends up the forearm.  Cysts in the area of the pain.  Swelling of your wrist and hand.  A sensation of snapping in the wrist.  Trouble moving the thumb and wrist. DIAGNOSIS  Your health care provider may diagnose de Quervain disease based on your signs and symptoms. A physical exam will also be done. A simple test Wynn Maudlin test) that involves pulling your thumb and wrist to see if this causes pain can help determine whether you have the condition. Sometimes you may need to have an X-ray.    TREATMENT  Avoiding any activity that causes pain and swelling is the best treatment. Other options include:  Wearing a splint.  Taking medicine. Anti-inflammatory medicines and corticosteroid injections may reduce inflammation and relieve pain.  Having surgery if other treatments do not work. HOME CARE INSTRUCTIONS   Using ice can be helpful after doing activities that involve the sore wrist. To apply ice to the injured area:  Put ice in a plastic bag.  Place a towel between your skin and the bag.  Leave the ice on for 20 minutes, 2-3 times a day.  Take medicines only as directed by your health care provider.  Wear your splint as directed. This will allow your hand to rest and heal. SEEK MEDICAL CARE IF:   Your pain medicine does not help.   Your pain gets worse.  You develop new symptoms. MAKE SURE YOU:   Understand these instructions.  Will watch your condition.  Will get help right away if you are not doing well or get worse.   This information is not intended to replace advice given to you by your health care provider. Make sure you discuss any questions you have with your health care provider.   Document Released: 12/01/2000 Document Revised: 03/29/2014 Document Reviewed: 07/11/2013 Elsevier Interactive Patient Education 2016 Elsevier Inc. Benign Positional Vertigo Vertigo is the feeling that you or your surroundings are moving when they are not. Benign positional vertigo is the most common form of  vertigo. The cause of this condition is not serious (is benign). This condition is triggered by certain movements and positions (is positional). This condition can be dangerous if it occurs while you are doing something that could endanger you or others, such as driving.  CAUSES In many cases, the cause of this condition is not known. It may be caused by a disturbance in an area of the inner ear that helps your brain to sense movement and balance. This disturbance can  be caused by a viral infection (labyrinthitis), head injury, or repetitive motion. RISK FACTORS This condition is more likely to develop in:  Women.  People who are 43 years of age or older. SYMPTOMS Symptoms of this condition usually happen when you move your head or your eyes in different directions. Symptoms may start suddenly, and they usually last for less than a minute. Symptoms may include:  Loss of balance and falling.  Feeling like you are spinning or moving.  Feeling like your surroundings are spinning or moving.  Nausea and vomiting.  Blurred vision.  Dizziness.  Involuntary eye movement (nystagmus). Symptoms can be mild and cause only slight annoyance, or they can be severe and interfere with daily life. Episodes of benign positional vertigo may return (recur) over time, and they may be triggered by certain movements. Symptoms may improve over time. DIAGNOSIS This condition is usually diagnosed by medical history and a physical exam of the head, neck, and ears. You may be referred to a health care provider who specializes in ear, nose, and throat (ENT) problems (otolaryngologist) or a provider who specializes in disorders of the nervous system (neurologist). You may have additional testing, including:  MRI.  A CT scan.  Eye movement tests. Your health care provider may ask you to change positions quickly while he or she watches you for symptoms of benign positional vertigo, such as nystagmus. Eye movement may be tested with an electronystagmogram (ENG), caloric stimulation, the Dix-Hallpike test, or the roll test.  An electroencephalogram (EEG). This records electrical activity in your brain.  Hearing tests. TREATMENT Usually, your health care provider will treat this by moving your head in specific positions to adjust your inner ear back to normal. Surgery may be needed in severe cases, but this is rare. In some cases, benign positional vertigo may resolve on its own  in 2-4 weeks. HOME CARE INSTRUCTIONS Safety  Move slowly.Avoid sudden body or head movements.  Avoid driving.  Avoid operating heavy machinery.  Avoid doing any tasks that would be dangerous to you or others if a vertigo episode would occur.  If you have trouble walking or keeping your balance, try using a cane for stability. If you feel dizzy or unstable, sit down right away.  Return to your normal activities as told by your health care provider. Ask your health care provider what activities are safe for you. General Instructions  Take over-the-counter and prescription medicines only as told by your health care provider.  Avoid certain positions or movements as told by your health care provider.  Drink enough fluid to keep your urine clear or pale yellow.  Keep all follow-up visits as told by your health care provider. This is important. SEEK MEDICAL CARE IF:  You have a fever.  Your condition gets worse or you develop new symptoms.  Your family or friends notice any behavioral changes.  Your nausea or vomiting gets worse.  You have numbness or a "pins and needles" sensation. SEEK IMMEDIATE MEDICAL CARE IF:  You have difficulty speaking or moving.  You are always dizzy.  You faint.  You develop severe headaches.  You have weakness in your legs or arms.  You have changes in your hearing or vision.  You develop a stiff neck.  You develop sensitivity to light.   This information is not intended to replace advice given to you by your health care provider. Make sure you discuss any questions you have with your health care provider.   Document Released: 12/14/2005 Document Revised: 11/27/2014 Document Reviewed: 07/01/2014 Elsevier Interactive Patient Education [employer].

## 2015-11-12 ENCOUNTER — Ambulatory Visit (INDEPENDENT_AMBULATORY_CARE_PROVIDER_SITE_OTHER): Payer: BLUE CROSS/BLUE SHIELD

## 2015-11-12 DIAGNOSIS — R937 Abnormal findings on diagnostic imaging of other parts of musculoskeletal system: Secondary | ICD-10-CM

## 2015-11-12 DIAGNOSIS — Z780 Asymptomatic menopausal state: Secondary | ICD-10-CM

## 2015-11-14 DIAGNOSIS — M654 Radial styloid tenosynovitis [de Quervain]: Secondary | ICD-10-CM | POA: Insufficient documentation

## 2015-11-14 DIAGNOSIS — H8110 Benign paroxysmal vertigo, unspecified ear: Secondary | ICD-10-CM | POA: Insufficient documentation

## 2015-11-14 DIAGNOSIS — M62830 Muscle spasm of back: Secondary | ICD-10-CM | POA: Insufficient documentation

## 2015-11-14 NOTE — Progress Notes (Signed)
   Subjective:    Patient ID: Diane Brooks, female    DOB: [birth year], 60 y.o.   MRN: [id number]  HPI Patient is a 60 year old female who presents to the clinic with dizziness when moving her head to the left. She first noticed this when she went to lay on her left side in the bed. She would feel really semi-had it and have to change positions. When she is upright she does not feel the symptoms but if she goes to lay her head to the left or do movements to the left she is very dizzy. She has a little bit of nausea but no vomiting. His been present for the last 2-3 days.  She continues to have muscle spasms and tightness of the left side of her body. She is using Mavik but she does not feel like is helping very much. She is also using Norflex but it tends to make her sleepy. Does help some with the muscle spasms.  She now has some pain in her left thumb radiating up her arm. She notices this when she is driving. She's not tried anything to make better.   Review of Systems    see HPI.  Objective:   Physical Exam  Constitutional: She is oriented to person, place, and time. She appears well-developed and well-nourished.  HENT:  Head: Normocephalic and atraumatic.  Cardiovascular: Normal rate, regular rhythm and normal heart sounds.   Pulmonary/Chest: Effort normal and breath sounds normal.  Musculoskeletal:  Tenderness and tightness of the paraspinous muscles more left than right from mid thoracic region into lower lumbar region. No pain over spine.   Positive left finklestein.   Neurological: She is alert and oriented to person, place, and time.  Positive dix hallpike to the left with nystagmus and dizziness.   Psychiatric: She has a normal mood and affect. Her behavior is normal.          Assessment & Plan:  BPV, left- epley given to do 3 times a day until dizziness stops. If no improvement will send to PT. HO given.   Muscle spasms back- encouraged massages twice a month.  celebrex given to start. Stretch every morning. Heat before exercises and ice after. biofreeze as needed.   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 Will start Celebrex to replace Mobic.. Encouraged ice. Handout given. If not improving follow-up with sports med. Could be some carpel tunnel as well. Brace could help with that as well. May need to do EMG.

## 2015-11-19 ENCOUNTER — Encounter: Payer: Self-pay | Admitting: Physician Assistant

## 2015-11-19 ENCOUNTER — Ambulatory Visit (INDEPENDENT_AMBULATORY_CARE_PROVIDER_SITE_OTHER): Payer: BLUE CROSS/BLUE SHIELD | Admitting: Physician Assistant

## 2015-11-19 VITALS — BP 140/66 | HR 71 | Ht 62.5 in | Wt 180.0 lb

## 2015-11-19 DIAGNOSIS — M791 Myalgia: Secondary | ICD-10-CM | POA: Diagnosis not present

## 2015-11-19 DIAGNOSIS — R5383 Other fatigue: Secondary | ICD-10-CM

## 2015-11-19 DIAGNOSIS — M5442 Lumbago with sciatica, left side: Secondary | ICD-10-CM

## 2015-11-19 DIAGNOSIS — R51 Headache: Secondary | ICD-10-CM | POA: Diagnosis not present

## 2015-11-19 DIAGNOSIS — M609 Myositis, unspecified: Secondary | ICD-10-CM

## 2015-11-19 DIAGNOSIS — M2550 Pain in unspecified joint: Secondary | ICD-10-CM | POA: Insufficient documentation

## 2015-11-19 DIAGNOSIS — R519 Headache, unspecified: Secondary | ICD-10-CM

## 2015-11-19 DIAGNOSIS — IMO0001 Reserved for inherently not codable concepts without codable children: Secondary | ICD-10-CM

## 2015-11-19 MED ORDER — DULOXETINE HCL 30 MG PO CPEP: 30.0000 mg | ORAL_CAPSULE | Freq: Every day | ORAL | 2 refills | Status: DC

## 2015-11-19 MED ORDER — PREGABALIN 75 MG PO CAPS: 75.0000 mg | ORAL_CAPSULE | Freq: Two times a day (BID) | ORAL | 2 refills | Status: DC

## 2015-11-19 NOTE — Progress Notes (Signed)
   Subjective:    Patient ID: Diane Brooks, female    DOB: 1955-08-04, 60 y.o.   MRN: [id number]  HPI Patient is a 60 year old female who presents to the clinic with 5 months of muscle pain and tightness. I evaluated her spine with x-rays which were normal. We have tried anti-inflammatories and muscle relaxers with no benefit. Her pain continues and worsens. She is tender over her whole Oddi. She does have a massage scheduled. She went to the community clinic and they mention fibromyalgia. She wonders if she has this diagnosis. Originally when I first started seeing patient 5 months ago for these musculoskeletal issues I thought this was the first time this would happen. Now patient describes a history of up Sunday around with musculoskeletal pain. She was told in [place] at one point she may have fibromyalgia. She just wants to feel better. She has grandchildren coming along and she doesn't want to move. She hates getting out of bed. She has dull pain all day long.   Review of Systems See HPI>     Objective:   Physical Exam  Constitutional: She is oriented to person, place, and time. She appears well-developed and well-nourished.  HENT:  Head: Normocephalic and atraumatic.  Neurological: She is alert and oriented to person, place, and time.  Psychiatric:  Tearful.           Assessment & Plan:  Myalgias-patient like to be evaluated by rheumatologist. I will place the order. Showing suggest for myalgia and I wanted ahead and start treatment. Cymbalta 30 mg once daily and Lyrica 75 mg twice a day for by started today. Patient will follow-up in 6 weeks. I will go ahead and place a order for physical therapy. Perhaps physical therapy working on her muscles could improve symptoms. Continue on Celebrex as tolerated. Gave handout and discussed fibromyalgia in detail. Encouraged patient to stay active and have a good diet.

## 2015-11-19 NOTE — Patient Instructions (Signed)
Myofascial Pain Syndrome and Fibromyalgia  Myofascial pain syndrome and fibromyalgia are both pain disorders. This pain may be felt mainly in your muscles.   · Myofascial pain syndrome:    Always has trigger points or tender points in the muscle that will cause pain when pressed. The pain may come and go.    Usually affects your neck, upper back, and shoulder areas. The pain often radiates into your arms and hands.  · Fibromyalgia:    Has muscle pains and tenderness that come and go.    Is often associated with fatigue and sleep disturbances.    Has trigger points.    Tends to be long-lasting (chronic), but is not life-threatening.  Fibromyalgia and myofascial pain are not the same. However, they often occur together. If you have both conditions, each can make the other worse. Both are common and can cause enough pain and fatigue to make day-to-day activities difficult.   CAUSES   The exact causes of fibromyalgia and myofascial pain are not known. People with certain gene types may be more likely to develop fibromyalgia. Some factors can be triggers for both conditions, such as:   · Spine disorders.  · Arthritis.  · Severe injury (trauma) and other physical stressors.  · Being under a lot of stress.  · A medical illness.  SIGNS AND SYMPTOMS   Fibromyalgia  The main symptom of fibromyalgia is widespread pain and tenderness in your muscles. This can vary over time. Pain is sometimes described as stabbing, shooting, or burning. You may have tingling or numbness, too. You may also have sleep problems and fatigue. You may wake up feeling tired and groggy (fibro fog). Other symptoms may include:   · Bowel and bladder problems.  · Headaches.  · Visual problems.  · Problems with odors and noises.  · Depression or mood changes.  · Painful menstrual periods (dysmenorrhea).  · Dry skin or eyes.  Myofascial pain syndrome  Symptoms of myofascial pain syndrome include:   · Tight, ropy bands of muscle.    · Uncomfortable  sensations in muscular areas, such as:    Aching.    Cramping.    Burning.    Numbness.    Tingling.      Muscle weakness.  · Trouble moving certain muscles freely (range of motion).  DIAGNOSIS   There are no specific tests to diagnose fibromyalgia or myofascial pain syndrome. Both can be hard to diagnose because their symptoms are common in many other conditions. Your health care provider may suspect one or both of these conditions based on your symptoms and medical history. Your health care provider will also do a physical exam.   The key to diagnosing fibromyalgia is having pain, fatigue, and other symptoms for more than three months that cannot be explained by another condition.   The key to diagnosing myofascial pain syndrome is finding trigger points in muscles that are tender and cause pain elsewhere in your body (referred pain).  TREATMENT   Treating fibromyalgia and myofascial pain often requires a team of health care providers. This usually starts with your primary provider and a physical therapist. You may also find it helpful to work with alternative health care providers, such as massage therapists or acupuncturists.  Treatment for fibromyalgia may include medicines. This may include nonsteroidal anti-inflammatory drugs (NSAIDs), along with other medicines.   Treatment for myofascial pain may also include:  · NSAIDs.  · Cooling and stretching of muscles.  · Trigger point injections.  ·   Sound wave (ultrasound) treatments to stimulate muscles.  HOME CARE INSTRUCTIONS   · Take medicines only as directed by your health care provider.  · Exercise as directed by your health care provider or physical therapist.  · Try to avoid stressful situations.  · Practice relaxation techniques to control your stress. You may want to try:    Biofeedback.    Visual imagery.    Hypnosis.    Muscle relaxation.    Yoga.    Meditation.  · Talk to your health care provider about alternative treatments, such as acupuncture or  massage treatment.  · Maintain a healthy lifestyle. This includes eating a healthy diet and getting enough sleep.  · Consider joining a support group.  · Do not do activities that stress or strain your muscles. That includes repetitive motions and heavy lifting.  SEEK MEDICAL CARE IF:   · You have new symptoms.  · Your symptoms get worse.  · You have side effects from your medicines.  · You have trouble sleeping.  · Your condition is causing depression or anxiety.  FOR MORE INFORMATION   · National Fibromyalgia Association: http://www.fmaware.orgwww.fmaware.org  · Arthritis Foundation: http://www.arthritis.orgwww.arthritis.org  · American Chronic Pain Association: http://www.theacpa.org/condition/myofascial-painwww.theacpa.org/condition/myofascial-pain     This information is not intended to replace advice given to you by your health care provider. Make sure you discuss any questions you have with your health care provider.     Document Released: 03/08/2005 Document Revised: 03/29/2014 Document Reviewed: 12/12/2013  Elsevier Interactive Patient Education ©2016 Elsevier Inc.

## 2015-12-02 ENCOUNTER — Encounter: Payer: Self-pay | Admitting: Physical Therapy

## 2015-12-02 ENCOUNTER — Ambulatory Visit (INDEPENDENT_AMBULATORY_CARE_PROVIDER_SITE_OTHER): Payer: BLUE CROSS/BLUE SHIELD | Admitting: Physical Therapy

## 2015-12-02 DIAGNOSIS — R52 Pain, unspecified: Secondary | ICD-10-CM | POA: Diagnosis not present

## 2015-12-02 DIAGNOSIS — M6281 Muscle weakness (generalized): Secondary | ICD-10-CM

## 2015-12-02 NOTE — Patient Instructions (Addendum)
Head Press With [place]     Tuck chin SLIGHTLY toward chest, keep mouth closed. Feel weight on back of head. Increase weight by pressing head down. Hold _3-5__ seconds. Relax. Repeat _10__ times. Surface: floor  Once a day.   Over Head Pull: Narrow Grip        On back, knees bent, feet flat, band across thighs, elbows straight but relaxed. Pull hands apart (start). Keeping elbows straight, bring arms up and over head, hands toward floor. Keep pull steady on band. Hold momentarily. Return slowly, keeping pull steady, back to start. Repeat _1-2x10__ times. Band color ___yellow___ once a day  Side Pull: Double Arm   On back, knees bent, feet flat. Arms perpendicular to body, shoulder level, elbows straight but relaxed. Pull arms out to sides, elbows straight. Resistance band comes across collarbones, hands toward floor. Hold momentarily. Slowly return to starting position. Repeat _1-2x10__ times. Band color __yellow___ once a day.   Sash   On back, knees bent, feet flat, left hand on left hip, right hand above left. Pull right arm DIAGONALLY (hip to shoulder) across chest. Bring right arm along head toward floor. Hold momentarily. Slowly return to starting position. Repeat _1-2x10__ times. Do with left arm. Band color _yellow___ once a day   Shoulder Rotation: Double Arm   On back, knees bent, feet flat, elbows tucked at sides, bent 90, hands palms up. Pull hands apart and down toward floor, keeping elbows near sides. Hold momentarily. Slowly return to starting position. Repeat _1-2x10__ times. Band color __yellow___ once a day   Scapula Adduction With Pectorals, Low   Stand in doorframe with palms against frame and arms at 45. Lean forward and squeeze shoulder blades. Hold _30__ seconds. Repeat _1__ times per session. Do _1__ sessions per day.  Scapula Adduction With Pectorals, Mid-Range   Stand in doorframe with palms against frame and arms at 90. Lean  forward and squeeze shoulder blades. Hold _30__ seconds. Repeat __1_ times per session. Do _1__ sessions per day. Copyright  [employer]HI. All rights reserved.

## 2015-12-02 NOTE — Therapy (Signed)
[place], [place], [address] Phone: [phone]   Fax:  [phone]  Physical Therapy Evaluation  Patient Details  Name: Diane Brooks MRN: [id number] Date of Birth: 07-Nov-1955 Referring Provider: Iran Planas PA  Encounter Date: 12/02/2015      PT End of Session - 12/02/15 0807    Visit Number 1   Number of Visits 12   Date for PT Re-Evaluation 01/13/16   PT Start Time 0809   PT Stop Time 0904   PT Time Calculation (min) 55 min   Activity Tolerance Patient tolerated treatment well      Past Medical History:  Diagnosis Date  . Diabetes mellitus without complication ([place])   . Hyperlipidemia   . Hypertension     Past Surgical History:  Procedure Laterality Date  . BREAST ENHANCEMENT SURGERY    . ESOPHAGEAL MANOMETRY N/A 07/02/2013   Procedure: ESOPHAGEAL MANOMETRY (EM);  Surgeon: Jerene Bears, MD;  Location: [facility];  Service: Gastroenterology;  Laterality: N/A;  . TONSILECTOMY/ADENOIDECTOMY WITH MYRINGOTOMY    . TUBAL LIGATION    . tummy tuck      There were no vitals filed for this visit.       Subjective Assessment - 12/02/15 0810    Subjective Pt reports she has generalized body pain for about 6 months or so.  She will have episodes of the pain that starts around the Lt shoulder blade and will travel up into her head and affect her vision. She asked her husband to massage and he felt like a knot. This most recent has been over 2 months and just the last 2 days the pain is starting to go down.    Pertinent History wears glasses, perscription is ok, just recently started on lyrica. Goes to the gym 4 days a week - treadmill, elliptical, some leg machines.    Diagnostic tests x-rays (-)    Patient Stated Goals wants to feel better, complete her IADLs in the house without pain, strengthen upper back.   Currently in Pain? Yes   Pain Score 5   last few weeks 10/10   Pain Location  Thoracic  into head   Pain Descriptors / Indicators --  becomes very sensative to all stimulation when it is back   Pain Type Chronic pain   Pain Radiating Towards head    Pain Onset More than a month ago   Pain Frequency Intermittent   Aggravating Factors  cold, and then random   Pain Relieving Factors not a lot, massage, heat/ice            [facility] PT Assessment - 12/02/15 0001      Assessment   Medical Diagnosis Myalgia & myositis   Referring Provider Iran Planas PA   Onset Date/Surgical Date 06/01/15   Hand Dominance Right   Next MD Visit 01/04/16   Prior Therapy 5 yrs ago      Precautions   Precautions --  no heavy lifitng     Balance Screen   Has the patient fallen in the past 6 months No   Has the patient had a decrease in activity level because of a fear of falling?  No   Is the patient reluctant to leave their home because of a fear of falling?  No     Prior Function   Level of Independence Independent  unless her pain is high, needs help with overhead   Vocation Retired  Leisure reading ( difficulty due to pain and blurred vision at times)      Observation/Other Assessments   Focus on Therapeutic Outcomes (FOTO)  52% limited     Posture/Postural Control   Posture/Postural Control Postural limitations   Postural Limitations Increased lumbar lordosis;Rounded Shoulders     ROM / Strength   AROM / PROM / Strength AROM;Strength     AROM   AROM Assessment Site Cervical;Shoulder;Thoracic   Right/Left Shoulder --  bilat WNL   Cervical Flexion WNL  pain in mid back   Cervical Extension WNL   Cervical - Right Side Bend 39   Cervical - Left Side Bend 22  with head pain   Cervical - Right Rotation WNL   Cervical - Left Rotation WNL   Thoracic Flexion WNL   Thoracic Extension WNL with pain   Thoracic - Right Side Bend decreased 25% with pain   Thoracic - Left Side Bend WNL   Thoracic - Right Rotation WNL   Thoracic - Left Rotation WNL     Strength    Overall Strength Comments mid trap 4/5, low trap 4-/5   Strength Assessment Site Shoulder;Elbow   Right/Left Shoulder --  WNL - pain flex/abduction   Right/Left Elbow --  WNL     Flexibility   Soft Tissue Assessment /Muscle Length no     Palpation   Spinal mobility unable to truly assess as she was reactive at all cervical and thoracic levels.    Palpation comment very sensitive to palpation around bilat scapula, shoulders, thoracic and cervical paraspinals.                    [facility] Adult PT Treatment/Exercise - 12/02/15 0001      Exercises   Exercises Neck     Neck Exercises: Standing   Other Standing Exercises doorway stretch low/mid x    Other Standing Exercises 30sec     Neck Exercises: Supine   Neck Retraction 10 reps;3 secs  head presses   Shoulder Flexion Both;10 reps  yellow band, overheads   Other Supine Exercise 10 reps yellow band , horizontal abduct, ER SASH     Modalities   Modalities Electrical Stimulation;Moist Heat     Moist Heat Therapy   Number Minutes Moist Heat 15 Minutes   Moist Heat Location Cervical  and thoracic     Electrical Stimulation   Electrical Stimulation Location thoracic  & cervical   Electrical Stimulation Action IFC   Electrical Stimulation Parameters to tolerance   Electrical Stimulation Goals Pain                PT Education - 12/02/15 0844    Education provided Yes   Education Details HEP   Person(s) Educated Patient   Methods Explanation;Demonstration;Handout   Comprehension Returned demonstration;Verbalized understanding             PT Long Term Goals - 12/02/15 0809      PT LONG TERM GOAL #1   Title I with HEP for gentle stretching, gym machines,  light resistance ( 01/13/16)    Time 6   Period Weeks   Status New     PT LONG TERM GOAL #2   Title report overall reduction of pain =/> 50% with daily activity ( 01/13/16)    Time 6   Period Weeks   Status New     PT LONG TERM GOAL #3    Title improve FOTO =/> 39% limited ( 01/13/16)  Time 6   Period Weeks   Status New     PT LONG TERM GOAL #4   Title demo upper back stength =/> 5-/5 ( 01/13/16)    Time 6   Period Weeks   Status New     PT LONG TERM GOAL #5   Title demo simulation of holding grandchild without upper back/neck pain ( 01/13/16)    Time 6   Period Weeks   Status New               Plan - 12/02/15 1308    Clinical Impression Statement 60 yo female presents with c/o body pain that will have exacerbations and remissions.  Most recently the pain starts in the Lt shoulder blade area, travels up the neck into her head and face.  The pain as begun to subside over the last couple of days.  She relates a lot of it to stress.  She has slight limited and painful cervical and thoracic motion, some upper back weakness and postural changes.    Rehab Potential Good   PT Frequency 2x / week   PT Duration 6 weeks   PT Treatment/Interventions Moist Heat;Traction;Ultrasound;Therapeutic exercise;Dry needling;Taping;Vasopneumatic Device;Manual techniques;Passive range of motion;Patient/family education;Functional mobility training;Electrical Stimulation;Cryotherapy   PT Next Visit Plan progress postural exercise, manual work to upper back/shoulders if she can tolerate.    Consulted and Agree with Plan of Care Patient      Patient will benefit from skilled therapeutic intervention in order to improve the following deficits and impairments:  Postural dysfunction, Decreased strength, Pain, Decreased activity tolerance, Increased muscle spasms, Impaired UE functional use, Decreased range of motion  Visit Diagnosis: Pain, generalized - Plan: PT plan of care cert/re-cert  Muscle weakness (generalized) - Plan: PT plan of care cert/re-cert     Problem List Patient Active Problem List   Diagnosis Date Noted  . Myalgia and myositis 11/19/2015  . De Quervain's disease (radial styloid tenosynovitis) 11/14/2015  .  BPV (benign positional vertigo) 11/14/2015  . Muscle spasm of back 11/14/2015  . Mid back pain 10/24/2015  . Left-sided low back pain with left-sided sciatica 10/24/2015  . Neck pain 10/24/2015  . Migraine headache without aura 10/16/2015  . Abdominal pain, epigastric 11/09/2013  . Obesity, unspecified 10/29/2013  . Hot flashes 10/29/2013  . Seasonal allergies 08/03/2013  . Allergic rhinitis 08/03/2013  . Tinea versicolor 08/03/2013  . Sudoriferous cyst 07/17/2013  . 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 Obesity (BMI 30-39.9) 06/27/2013  . Diabetes mellitus ([place]) 06/27/2013  . Abnormal weight gain 06/27/2013  . Hyperlipemia 06/27/2013  . Cyst, eyelid sebaceous 06/27/2013  . Onychomycosis 06/27/2013  . Colon cancer screening 05/25/2013  . High risk HPV infection 05/24/2013  . GERD (gastroesophageal reflux disease) 05/10/2013  . Globus sensation 05/10/2013  . Atypical chest pain 05/10/2013  . Internal hemorrhoids 05/10/2013  . Decreased libido 05/10/2013  . Hyperlipidemia 03/28/2013  . Hiatal hernia 03/28/2013  . Essential hypertension, benign 03/28/2013  . Diabetes mellitus type II, controlled ([place]) 03/28/2013    Jeral Pinch PT  12/02/2015, 1:17 PM  [facility] [place], [place], [address] Phone: [phone]   Fax:  [phone]  Name: Jahzeel Railsback MRN: [id number] Date of Birth: [birth year]

## 2015-12-05 ENCOUNTER — Ambulatory Visit (INDEPENDENT_AMBULATORY_CARE_PROVIDER_SITE_OTHER): Payer: BLUE CROSS/BLUE SHIELD | Admitting: Rehabilitative and Restorative Service Providers"

## 2015-12-05 ENCOUNTER — Encounter: Payer: Self-pay | Admitting: Rehabilitative and Restorative Service Providers"

## 2015-12-05 DIAGNOSIS — R52 Pain, unspecified: Secondary | ICD-10-CM | POA: Diagnosis not present

## 2015-12-05 DIAGNOSIS — M6281 Muscle weakness (generalized): Secondary | ICD-10-CM

## 2015-12-05 NOTE — Patient Instructions (Signed)
Axial Extension (Chin Tuck)    Pull chin in and lengthen back of neck. Hold __10__ seconds while counting out loud. Repeat __5-10 times. Do _4-5_ sessions per day.  Shoulder Blade Squeeze   With noodle along spine  Rotate shoulders back, then squeeze shoulder blades down and back. Hold 10 sec Repeat __10_ times. Do __several __ sessions per day. Can do this without the noodle   Resisted External Rotation: in Neutral - Bilateral   PALMS UP Sit or stand, tubing in both hands, elbows at sides, bent to 90, forearms forward. Pinch shoulder blades together and rotate forearms out. Keep elbows at sides. Repeat __10__ times per set. Do _2-3___ sets per session. Do _2-3___ sessions per day.   Low Row: Standing   Face anchor, feet shoulder width apart. Palms up, pull arms back, squeezing shoulder blades together. Repeat 10__ times per set. Do 2-3__ sets per session. Do 2-3__ sessions per week. Anchor Height: Waist     Strengthening: Resisted Extension   Hold tubing in right hand, arm forward. Pull arm back, elbow straight. Repeat _10___ times per set. Do 2-3____ sets per session. Do 2-3____ sessions per day.

## 2015-12-05 NOTE — Therapy (Signed)
[place], [place], [address] Phone: [phone]   Fax:  [phone]  Physical Therapy Treatment  Patient Details  Name: Diane Brooks MRN: [id number] Date of Birth: [birth year] Referring Provider: Iran Planas PA  Encounter Date: 12/05/2015      PT End of Session - 12/05/15 1338    Visit Number 2   Number of Visits 12   Date for PT Re-Evaluation 01/13/16   PT Start Time [id number]   PT Stop Time 1417   PT Time Calculation (min) 44 min   Activity Tolerance Patient tolerated treatment well      Past Medical History:  Diagnosis Date  . Diabetes mellitus without complication ([place])   . Hyperlipidemia   . Hypertension     Past Surgical History:  Procedure Laterality Date  . BREAST ENHANCEMENT SURGERY    . ESOPHAGEAL MANOMETRY N/A 07/02/2013   Procedure: ESOPHAGEAL MANOMETRY (EM);  Surgeon: Jerene Bears, MD;  Location: [facility];  Service: Gastroenterology;  Laterality: N/A;  . TONSILECTOMY/ADENOIDECTOMY WITH MYRINGOTOMY    . TUBAL LIGATION    . tummy tuck      There were no vitals filed for this visit.      Subjective Assessment - 12/05/15 1343    Subjective Feeling better than she did last visit. Exerciseing at home and in the gym.    Currently in Pain? Yes   Pain Score 5    Pain Location Thoracic   Pain Orientation Mid   Pain Descriptors / Indicators --  pinch   Pain Type Chronic pain   Pain Onset More than a month ago   Pain Frequency Intermittent                         [facility] Adult PT Treatment/Exercise - 12/05/15 0001      Neck Exercises: Standing   Neck Retraction 5 reps;10 secs   Other Standing Exercises doorway stretch low/mid/high  x 3 each 30 sec    Other Standing Exercises scap squeeze 10 sec x 10      Shoulder Exercises: Standing   Extension Strengthening;Both;20 reps;Theraband   Theraband Level (Shoulder Extension) Level 1 (Yellow)   Row  Strengthening;Both;20 reps;Theraband   Theraband Level (Shoulder Row) Level 1 (Yellow)   Retraction Strengthening;Both;20 reps;Theraband  with swim noodle    Theraband Level (Shoulder Retraction) Level 1 (Yellow)     Modalities   Modalities Electrical Stimulation;Moist Heat     Moist Heat Therapy   Number Minutes Moist Heat 15 Minutes   Moist Heat Location Cervical  and thoracic     Electrical Stimulation   Electrical Stimulation Location bilat thoracic paraspinals    Electrical Stimulation Action IFC   Electrical Stimulation Parameters to tolerance   Electrical Stimulation Goals Pain                PT Education - 12/05/15 1352    Education provided Yes   Education Details posture and alignment; HEP    Person(s) Educated Patient   Methods Explanation;Demonstration;Tactile cues;Handout   Comprehension Verbalized understanding;Returned demonstration;Verbal cues required;Tactile cues required             PT Long Term Goals - 12/05/15 1342      PT LONG TERM GOAL #1   Title I with HEP for gentle stretching, gym machines,  light resistance ( 01/13/16)    Time 6   Status On-going     PT LONG  TERM GOAL #2   Title report overall reduction of pain =/> 50% with daily activity ( 01/13/16)    Time 6   Period Weeks   Status On-going     PT LONG TERM GOAL #3   Title improve FOTO =/> 39% limited ( 01/13/16)    Time 6   Period Weeks   Status On-going     PT LONG TERM GOAL #4   Title demo upper back stength =/> 5-/5 ( 01/13/16)    Time 6   Period Weeks   Status On-going     PT LONG TERM GOAL #5   Title demo simulation of holding grandchild without upper back/neck pain ( 01/13/16)    Time 6   Period Weeks   Status On-going               Plan - 12/05/15 1340    Clinical Impression Statement Patient reports that she is feeling a bit better than she did at initial visit. She is doing her exercises at home and has been back in the gym as well. Tolerated  all exercises well today. No goals accomplished - only 2nd visit.    Rehab Potential Good   PT Frequency 2x / week   PT Duration 6 weeks   PT Treatment/Interventions Moist Heat;Traction;Ultrasound;Therapeutic exercise;Dry needling;Taping;Vasopneumatic Device;Manual techniques;Passive range of motion;Patient/family education;Functional mobility training;Electrical Stimulation;Cryotherapy   PT Next Visit Plan progress postural exercise, manual work to upper back/shoulders if she can tolerate.    Consulted and Agree with Plan of Care Patient      Patient will benefit from skilled therapeutic intervention in order to improve the following deficits and impairments:  Postural dysfunction, Decreased strength, Pain, Decreased activity tolerance, Increased muscle spasms, Impaired UE functional use, Decreased range of motion  Visit Diagnosis: Pain, generalized  Muscle weakness (generalized)     Problem List Patient Active Problem List   Diagnosis Date Noted  . Myalgia and myositis 11/19/2015  . De Quervain's disease (radial styloid tenosynovitis) 11/14/2015  . BPV (benign positional vertigo) 11/14/2015  . Muscle spasm of back 11/14/2015  . Mid back pain 10/24/2015  . Left-sided low back pain with left-sided sciatica 10/24/2015  . Neck pain 10/24/2015  . Migraine headache without aura 10/16/2015  . Abdominal pain, epigastric 11/09/2013  . Obesity, unspecified 10/29/2013  . Hot flashes 10/29/2013  . Seasonal allergies 08/03/2013  . Allergic rhinitis 08/03/2013  . Tinea versicolor 08/03/2013  . Sudoriferous cyst 07/17/2013  . 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 Obesity (BMI 30-39.9) 06/27/2013  . Diabetes mellitus ([place]) 06/27/2013  . Abnormal weight gain 06/27/2013  . Hyperlipemia 06/27/2013  . Cyst, eyelid sebaceous 06/27/2013  . Onychomycosis 06/27/2013  . Colon cancer screening 05/25/2013  . High risk HPV infection 05/24/2013  . GERD (gastroesophageal reflux disease) 05/10/2013   . Globus sensation 05/10/2013  . Atypical chest pain 05/10/2013  . Internal hemorrhoids 05/10/2013  . Decreased libido 05/10/2013  . Hyperlipidemia 03/28/2013  . Hiatal hernia 03/28/2013  . Essential hypertension, benign 03/28/2013  . Diabetes mellitus type II, controlled ([place]) 03/28/2013    Jaice Digioia Nilda Simmer PT, MPH  12/05/2015, 2:03 PM  [facility] [place], [place], [address] Phone: [phone]   Fax:  [phone]  Name: Scottlynn Nusser MRN: [id number] Date of Birth: September 18, 1955

## 2015-12-08 ENCOUNTER — Ambulatory Visit (INDEPENDENT_AMBULATORY_CARE_PROVIDER_SITE_OTHER): Payer: BLUE CROSS/BLUE SHIELD | Admitting: Physical Therapy

## 2015-12-08 DIAGNOSIS — R52 Pain, unspecified: Secondary | ICD-10-CM

## 2015-12-08 DIAGNOSIS — M6281 Muscle weakness (generalized): Secondary | ICD-10-CM

## 2015-12-08 NOTE — Therapy (Addendum)
[place], [place], [address] Phone: [phone]   Fax:  [phone]  Physical Therapy Treatment  Patient Details  Name: Diane Brooks MRN: [id number] Date of Birth: 1955-10-02 Referring Provider: Iran Planas PA  Encounter Date: 12/08/2015      PT End of Session - 12/08/15 1000    Visit Number 3   Number of Visits 12   Date for PT Re-Evaluation 01/13/16   PT Start Time 0933   PT Stop Time 1030   PT Time Calculation (min) 57 min   Activity Tolerance Patient tolerated treatment well      Past Medical History:  Diagnosis Date  . Diabetes mellitus without complication ([place])   . Hyperlipidemia   . Hypertension     Past Surgical History:  Procedure Laterality Date  . BREAST ENHANCEMENT SURGERY    . ESOPHAGEAL MANOMETRY N/A 07/02/2013   Procedure: ESOPHAGEAL MANOMETRY (EM);  Surgeon: Jerene Bears, MD;  Location: [facility];  Service: Gastroenterology;  Laterality: N/A;  . TONSILECTOMY/ADENOIDECTOMY WITH MYRINGOTOMY    . TUBAL LIGATION    . tummy tuck      There were no vitals filed for this visit.      Subjective Assessment - 12/08/15 0937    Subjective Patient reports she is starting to feel better each day.  She is completing HEP without any problems.  She is looking for a microwavable heating pad. Hasn't had any blurry vision for 2 wks, no headaches for 1 wk.    Patient Stated Goals wants to feel better, complete her IADLs in the house without pain, strengthen upper back.   Currently in Pain? Yes   Pain Score 4    Pain Location Shoulder   Pain Orientation Left   Pain Descriptors / Indicators Sharp   Aggravating Factors  bending over, bad posture    Pain Relieving Factors laying flat on floor.  heat.             [facility] PT Assessment - 12/08/15 0001      Assessment   Medical Diagnosis Myalgia & myositis   Onset Date/Surgical Date 06/01/15   Hand Dominance Right   Next MD  Visit 01/04/16   Prior Therapy 5 yrs ago      AROM   Cervical - Right Side Bend 50   Cervical - Left Side Bend 50          [facility] Adult PT Treatment/Exercise - 12/08/15 0001      Neck Exercises: Machines for Strengthening   UBE (Upper Arm Bike) L1: 2 min each direction (standing     Neck Exercises: Standing   Other Standing Exercises doorway stretch low/mid/high  x 3 each 30 sec      Shoulder Exercises: Standing   Extension Strengthening;Both;20 reps;Theraband   Theraband Level (Shoulder Extension) Level 2 (Red)   Row Strengthening;Both;20 reps;Theraband   Theraband Level (Shoulder Row) Level 2 (Red)     Modalities   Modalities Electrical Stimulation;Moist Heat     Moist Heat Therapy   Number Minutes Moist Heat 15 Minutes   Moist Heat Location Shoulder     Electrical Stimulation   Electrical Stimulation Location Lt post shoulder/ Lt thoracic paraspinals.    Electrical Stimulation Action IFC   Electrical Stimulation Parameters to tolerance    Electrical Stimulation Goals Pain     Manual Therapy   Manual Therapy Soft tissue mobilization;Myofascial release   Soft tissue mobilization TPR to Lt infraspinatus  Myofascial Release MFR to midthoracic paraspinals.      Neck Exercises: Stretches   Upper Trapezius Stretch 2 reps;20 seconds   Levator Stretch 2 reps;20 seconds   Lower Cervical/Upper Thoracic Stretch 3 reps  segmental roll down.                      PT Long Term Goals - 12/05/15 1342      PT LONG TERM GOAL #1   Title I with HEP for gentle stretching, gym machines,  light resistance ( 01/13/16)    Time 6   Status On-going     PT LONG TERM GOAL #2   Title report overall reduction of pain =/> 50% with daily activity ( 01/13/16)    Time 6   Period Weeks   Status On-going     PT LONG TERM GOAL #3   Title improve FOTO =/> 39% limited ( 01/13/16)    Time 6   Period Weeks   Status On-going     PT LONG TERM GOAL #4   Title demo upper back  stength =/> 5-/5 ( 01/13/16)    Time 6   Period Weeks   Status On-going     PT LONG TERM GOAL #5   Title demo simulation of holding grandchild without upper back/neck pain ( 01/13/16)    Time 6   Period Weeks   Status On-going               Plan - 12/08/15 1314    Clinical Impression Statement Pt tolerated increased resistance with shoulder exercises without any symptoms.  She demonstrated improved neck ROM today.  Pt very point tender along Lt rhomboid and infraspinatus, but she was able to tolerate light soft tissue mobilization.  Pt making great progress towards unmet goals.    Rehab Potential Good   PT Frequency 2x / week   PT Duration 6 weeks   PT Treatment/Interventions Moist Heat;Traction;Ultrasound;Therapeutic exercise;Dry needling;Taping;Vasopneumatic Device;Manual techniques;Passive range of motion;Patient/family education;Functional mobility training;Electrical Stimulation;Cryotherapy   PT Next Visit Plan progress postural exercise, continue manual work to upper back/shoulders, as tolerated.    Consulted and Agree with Plan of Care Patient      Patient will benefit from skilled therapeutic intervention in order to improve the following deficits and impairments:  Postural dysfunction, Decreased strength, Pain, Decreased activity tolerance, Increased muscle spasms, Impaired UE functional use, Decreased range of motion  Visit Diagnosis: Pain, generalized  Muscle weakness (generalized)     Problem List Patient Active Problem List   Diagnosis Date Noted  . Myalgia and myositis 11/19/2015  . De Quervain's disease (radial styloid tenosynovitis) 11/14/2015  . BPV (benign positional vertigo) 11/14/2015  . Muscle spasm of back 11/14/2015  . Mid back pain 10/24/2015  . Left-sided low back pain with left-sided sciatica 10/24/2015  . Neck pain 10/24/2015  . Migraine headache without aura 10/16/2015  . Abdominal pain, epigastric 11/09/2013  . Obesity, unspecified  10/29/2013  . Hot flashes 10/29/2013  . Seasonal allergies 08/03/2013  . Allergic rhinitis 08/03/2013  . Tinea versicolor 08/03/2013  . Sudoriferous cyst 07/17/2013  . 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 Obesity (BMI 30-39.9) 06/27/2013  . Diabetes mellitus ([place]) 06/27/2013  . Abnormal weight gain 06/27/2013  . Hyperlipemia 06/27/2013  . Cyst, eyelid sebaceous 06/27/2013  . Onychomycosis 06/27/2013  . Colon cancer screening 05/25/2013  . High risk HPV infection 05/24/2013  . GERD (gastroesophageal reflux disease) 05/10/2013  . Globus sensation 05/10/2013  . Atypical  chest pain 05/10/2013  . Internal hemorrhoids 05/10/2013  . Decreased libido 05/10/2013  . Hyperlipidemia 03/28/2013  . Hiatal hernia 03/28/2013  . Essential hypertension, benign 03/28/2013  . Diabetes mellitus type II, controlled ([place]) 03/28/2013   Kerin Perna, PTA 12/08/15 1:17 PM  [place], [place], [address] Phone: [phone]   Fax:  [phone]  Name: Oksana Deberry MRN: [id number] Date of Birth: 08/05/1955  PHYSICAL THERAPY DISCHARGE SUMMARY  Visits from Start of Care: 3  Current functional level related to goals / functional outcomes: unknown   Remaining deficits: unknown   Education / Equipment: HEP Plan:                                                    Patient goals were not met. Patient is being discharged due to not returning since the last visit.  ?????    Jeral Pinch, PT 02/16/16 2:33 PM

## 2015-12-10 ENCOUNTER — Encounter: Payer: BLUE CROSS/BLUE SHIELD | Admitting: Physical Therapy

## 2015-12-15 ENCOUNTER — Encounter: Payer: Self-pay | Admitting: Physician Assistant

## 2015-12-15 ENCOUNTER — Ambulatory Visit (INDEPENDENT_AMBULATORY_CARE_PROVIDER_SITE_OTHER): Payer: BLUE CROSS/BLUE SHIELD | Admitting: Physician Assistant

## 2015-12-15 ENCOUNTER — Encounter: Payer: BLUE CROSS/BLUE SHIELD | Admitting: Physical Therapy

## 2015-12-15 VITALS — BP 139/54 | HR 63 | Ht 62.5 in | Wt 180.0 lb

## 2015-12-15 DIAGNOSIS — K921 Melena: Secondary | ICD-10-CM | POA: Insufficient documentation

## 2015-12-15 DIAGNOSIS — K649 Unspecified hemorrhoids: Secondary | ICD-10-CM | POA: Diagnosis not present

## 2015-12-15 DIAGNOSIS — K1379 Other lesions of oral mucosa: Secondary | ICD-10-CM | POA: Insufficient documentation

## 2015-12-15 MED ORDER — MAGIC MOUTHWASH W/LIDOCAINE: 10.0000 mL | Freq: Four times a day (QID) | ORAL | 1 refills | Status: DC

## 2015-12-15 MED ORDER — HYDROCORTISONE ACE-PRAMOXINE 1-1 % RE FOAM: 1.0000 | Freq: Two times a day (BID) | RECTAL | 1 refills | Status: DC

## 2015-12-15 NOTE — Progress Notes (Addendum)
Subjective:     Patient ID: Diane Brooks, female   DOB: 01-Sep-1955, 60 y.o.   MRN: [id number]  HPI Patient is a 60 y.o. Hispanic female presenting today with complaints of a sore on her mouth. The patient notes that she believed she had a pimple but that it has been progressively worsening over the last week and a half. The patient states that she has been taking campho-phenique for the past 3 days and has been applying honey and aloe vera. The patient states that she has never had cold sores before and states that it is very itchy.  She notes that she is going to be around her grandchildren and has been increasingly concerned about her symptoms. The patient denies recent illness, fever, sore throat, difficulty swallowing, shortness of breath, or chest pain.   Additionally, the patient notes that when she lifts weights at the gym she sometimes feels painful hemorrhoids outside of her anus. The patient states that she is also having black colored stools. The patient denies eating an known foods that may be causing this, abdominal tenderness, blood in her stool, nausea, or vomiting.  Review of Systems  Constitutional: Negative for activity change, appetite change, chills, diaphoresis, fatigue, fever and unexpected weight change.  HENT: Negative for congestion, ear discharge, ear pain, postnasal drip, rhinorrhea, sinus pressure and sore throat.   Eyes: Negative for photophobia, pain, discharge, redness, itching and visual disturbance.  Respiratory: Negative for apnea, cough, chest tightness, shortness of breath and wheezing.   Cardiovascular: Negative for chest pain, palpitations and leg swelling.  Gastrointestinal: Positive for rectal pain. Negative for abdominal distention, abdominal pain, anal bleeding, blood in stool, constipation, diarrhea, nausea and vomiting.       Positive for Hemorrhoids and melena.  Endocrine: Negative.   Genitourinary: Negative.   Musculoskeletal: Negative.   Skin:  Negative.   Neurological: Negative for dizziness, syncope, weakness, light-headedness, numbness and headaches.  Psychiatric/Behavioral: Negative.       Objective:   Physical Exam  Constitutional: She is oriented to person, place, and time. She appears well-developed and well-nourished. No distress.  HENT:  Head: Normocephalic and atraumatic.  Right Ear: External ear normal.  Left Ear: External ear normal.  Nose: Nose normal.  Mouth/Throat: Oropharynx is clear and moist. No oropharyngeal exudate.  Eyes: Conjunctivae and EOM are normal. Pupils are equal, round, and reactive to light. Right eye exhibits no discharge. Left eye exhibits no discharge. No scleral icterus.  Neck: Normal range of motion. Neck supple. No JVD present. No tracheal deviation present. No thyromegaly present.  Cardiovascular: Normal rate, regular rhythm, normal heart sounds and intact distal pulses.  Exam reveals no gallop and no friction rub.   No murmur heard. Pulmonary/Chest: Effort normal and breath sounds normal. No stridor. No respiratory distress. She has no wheezes. She has no rales. She exhibits no tenderness.  Abdominal: Soft. Bowel sounds are normal. She exhibits no distension and no mass. There is no tenderness. There is no rebound and no guarding.  Genitourinary: Rectal exam shows guaiac negative stool.  Genitourinary Comments: External hemorrhoid and numerous internal hemorrhoids noted on physical exam. No blood, erythema, or tenderness noted.   Lymphadenopathy:    She has no cervical adenopathy.  Neurological: She is alert and oriented to person, place, and time. No cranial nerve deficit. Coordination normal.  Skin: Skin is warm and dry. No rash noted. She is not diaphoretic. No erythema. No pallor.  Psychiatric: She has a normal mood and affect. Her behavior  is normal. Judgment and thought content normal.      Assessment:       Diagnoses and all orders for this visit:  Black stools -      hydrocortisone-pramoxine (PROCTOFOAM-HC) rectal foam; Place 1 applicator rectally 2 (two) times daily.  Sore mouth -     magic mouthwash w/lidocaine SOLN; Take 10 mLs by mouth 4 (four) times daily. Swish for 2 minutes then spit out. For mouth sores.  Hemorrhoids, unspecified hemorrhoid type  Other orders -     Discontinue: magic mouthwash w/lidocaine SOLN; Take 10 mLs by mouth 4 (four) times daily. Swish for 2 minutes then spit out. -     Discontinue: hydrocortisone-pramoxine (PROCTOFOAM-HC) rectal foam; Place 1 applicator rectally 2 (two) times daily.   Plan:     1. Black stools/hemorrhoids - Patient had rectal exam and negative hemoccult in-office today. Discussed with patient that her symptoms could be secondary to her diet but it is necessary to rule-out an upper GI bleed. Patient was instructed to follow-up with GI for a possible colonoscopy/endoscopy and further complete examination.Normal endoscopy in 2015. Patient was given a prescription Proctofoam-HC for symptomatic relief. Discussed hemorrhoid prevention. Will continue to monitor. Discuss foods, iron, pepto bismol that can cause black stools.   2. Mouth sores - Discussed with patient that her sore was almost completely healed. Reassured patient seemed to be a fever blister due to herpetic involvement. Patient to continue with symptomatic treatment with Abreva over-the-counter and magic mouthwash with lidocaine for any lesions inside mouth that reoccur. Patient to return-to-clinic if symptoms do not improve or worsen.

## 2015-12-16 ENCOUNTER — Telehealth: Payer: Self-pay | Admitting: *Deleted

## 2015-12-16 NOTE — Telephone Encounter (Signed)
Form faxed to [facility] for proctofoam. This may be denied by insurance

## 2016-01-08 ENCOUNTER — Other Ambulatory Visit: Payer: Self-pay

## 2016-01-08 MED ORDER — METFORMIN HCL 1000 MG PO TABS: ORAL_TABLET | ORAL | 0 refills | Status: DC

## 2016-01-08 MED ORDER — SAXAGLIPTIN HCL 2.5 MG PO TABS: 2.5000 mg | ORAL_TABLET | Freq: Every day | ORAL | 0 refills | Status: DC

## 2016-01-13 ENCOUNTER — Encounter: Payer: BLUE CROSS/BLUE SHIELD | Admitting: Rehabilitative and Restorative Service Providers"

## 2016-02-06 ENCOUNTER — Other Ambulatory Visit: Payer: Self-pay

## 2016-02-06 MED ORDER — HYDROCHLOROTHIAZIDE 25 MG PO TABS: 25.0000 mg | ORAL_TABLET | Freq: Every day | ORAL | 0 refills | Status: DC

## 2016-03-03 ENCOUNTER — Ambulatory Visit (INDEPENDENT_AMBULATORY_CARE_PROVIDER_SITE_OTHER): Payer: BLUE CROSS/BLUE SHIELD | Admitting: Physician Assistant

## 2016-03-03 ENCOUNTER — Encounter: Payer: Self-pay | Admitting: Physician Assistant

## 2016-03-03 VITALS — BP 134/71 | HR 69 | Ht 62.5 in | Wt 178.0 lb

## 2016-03-03 DIAGNOSIS — E119 Type 2 diabetes mellitus without complications: Secondary | ICD-10-CM | POA: Diagnosis not present

## 2016-03-03 DIAGNOSIS — Z23 Encounter for immunization: Secondary | ICD-10-CM | POA: Diagnosis not present

## 2016-03-03 DIAGNOSIS — J040 Acute laryngitis: Secondary | ICD-10-CM

## 2016-03-03 DIAGNOSIS — I10 Essential (primary) hypertension: Secondary | ICD-10-CM

## 2016-03-03 DIAGNOSIS — R195 Other fecal abnormalities: Secondary | ICD-10-CM | POA: Diagnosis not present

## 2016-03-03 DIAGNOSIS — Z0000 Encounter for general adult medical examination without abnormal findings: Secondary | ICD-10-CM | POA: Diagnosis not present

## 2016-03-03 DIAGNOSIS — E782 Mixed hyperlipidemia: Secondary | ICD-10-CM | POA: Diagnosis not present

## 2016-03-03 DIAGNOSIS — Z1159 Encounter for screening for other viral diseases: Secondary | ICD-10-CM

## 2016-03-03 DIAGNOSIS — Z1231 Encounter for screening mammogram for malignant neoplasm of breast: Secondary | ICD-10-CM

## 2016-03-03 LAB — POCT GLYCOSYLATED HEMOGLOBIN (HGB A1C): Hemoglobin A1C: 6.1

## 2016-03-03 MED ORDER — HYDROCOD POLST-CPM POLST ER 10-8 MG/5ML PO SUER: 5.0000 mL | Freq: Every evening | ORAL | 0 refills | Status: DC | PRN

## 2016-03-03 MED ORDER — SAXAGLIPTIN HCL 2.5 MG PO TABS: 2.5000 mg | ORAL_TABLET | Freq: Every day | ORAL | 0 refills | Status: DC

## 2016-03-03 MED ORDER — PREDNISONE 50 MG PO TABS: ORAL_TABLET | ORAL | 0 refills | Status: DC

## 2016-03-03 MED ORDER — METFORMIN HCL 1000 MG PO TABS: ORAL_TABLET | ORAL | 0 refills | Status: DC

## 2016-03-03 MED ORDER — ATENOLOL 25 MG PO TABS: 25.0000 mg | ORAL_TABLET | Freq: Every day | ORAL | 1 refills | Status: DC

## 2016-03-03 MED ORDER — HYDROCHLOROTHIAZIDE 25 MG PO TABS: 25.0000 mg | ORAL_TABLET | Freq: Every day | ORAL | 1 refills | Status: DC

## 2016-03-03 MED ORDER — ATORVASTATIN CALCIUM 10 MG PO TABS: 10.0000 mg | ORAL_TABLET | Freq: Every day | ORAL | 1 refills | Status: DC

## 2016-03-03 NOTE — Patient Instructions (Signed)

## 2016-03-03 NOTE — Progress Notes (Signed)
Subjective:    Patient ID: Diane Brooks, female    DOB: [birth year], 58 y.o.   MRN: [id number]  HPI Patient is a 60 yo female who comes to the office for an annual physical and type II diabetes follow-up. Patient reports her glucose runs 100-120 daily.   Patient reports that she has been dry coughing and hoarse for one week. Patient report sinus congestion and rhinorrhea. She has tried Hals cough drops for the cough, which helped. She also reports a scratchy throat. Patient denies fever and chills. Patient denies coughing up any phlegm.   She also complains that her stools have been light in color for several months but that it comes and goes. Sometimes her stool with be brown and other days it will be cream colored. She reports that her stools look mucousy. Her frequency of bowel movements is unchanged. She has at least one bowel movement a day. Patient denies abdominal pain, diarrhea and constipation.    She reports that she stopped taking Cymbalta a few months ago and that she was only taking it for situational depression. It's been months. She also reports running out of Lipitor 2 weeks ago. She also requests refills for Hydrodiuril, Metformin and Onglyza.   Past Medical History:  Diagnosis Date  . Diabetes mellitus without complication ([place])   . Hyperlipidemia   . Hypertension    Past Surgical History:  Procedure Laterality Date  . BREAST ENHANCEMENT SURGERY    . ESOPHAGEAL MANOMETRY N/A 07/02/2013   Procedure: ESOPHAGEAL MANOMETRY (EM);  Surgeon: Jerene Bears, MD;  Location: [facility];  Service: Gastroenterology;  Laterality: N/A;  . TONSILECTOMY/ADENOIDECTOMY WITH MYRINGOTOMY    . TUBAL LIGATION    . tummy tuck     Current Outpatient Prescriptions on File Prior to Visit  Medication Sig Dispense Refill  . AMBULATORY NON FORMULARY MEDICATION Freestyle Glucometer   Dx: 250.00 DM, type II 1 Device 0  . AMBULATORY NON FORMULARY MEDICATION Freestyle test strips.   DX Type II  diabetes 100 strip 1  . atorvastatin (LIPITOR) 10 MG tablet Take 1 tablet (10 mg total) by mouth daily. 90 tablet 1  . DULoxetine (CYMBALTA) 30 MG capsule Take 1 capsule (30 mg total) by mouth daily. 30 capsule 2  . hydrochlorothiazide (HYDRODIURIL) 25 MG tablet Take 1 tablet (25 mg total) by mouth daily. NEED FOLLOW UP APPOINTMENT FOR MORE REFILLS 30 tablet 0  . metFORMIN (GLUCOPHAGE) 1000 MG tablet TAKE 1 TABLET (1,000 MG TOTAL) BY MOUTH 2 (TWO) TIMES DAILY WITH A MEAL. 180 tablet 0  . saxagliptin HCl (ONGLYZA) 2.5 MG TABS tablet Take 1 tablet (2.5 mg total) by mouth daily. 90 tablet 0   No current facility-administered medications on file prior to visit.    Social History   Social History  . Marital status: Married    Spouse name: N/A  . Number of children: 2  . Years of education: N/A   Occupational History  . Retired    Social History Main Topics  . Smoking status: Former Smoker    Quit date: 03/23/1991  . Smokeless tobacco: Never Used  . Alcohol use No  . Drug use: No  . Sexual activity: Yes    Birth control/ protection: Surgical   Other Topics Concern  . Not on file   Social History Narrative  . No narrative on file   No Known Allergies  Review of Systems     Objective: Blood pressure 134/71, pulse 69, height 5' 2.5" (  1.588 m), weight 178 lb (80.7 kg).   Physical Exam  Constitutional: She is oriented to person, place, and time. She appears well-developed and well-nourished.  HENT:  Head: Normocephalic and atraumatic.  Right Ear: External ear normal.  Left Ear: External ear normal.  Slight cerumen in both ears bilaterally. Tympanic membranes clear and visualized bilaterally. Oropharynx slightly erythematous with drainage noted. Nasal mucosa erythematous with no polyps.   Eyes: Conjunctivae and EOM are normal. Pupils are equal, round, and reactive to light.  Neck: Neck supple. No tracheal deviation present.  Cardiovascular: Normal rate and regular rhythm.  Exam  reveals no gallop and no friction rub.   No murmur heard. Pulmonary/Chest: Effort normal and breath sounds normal. No respiratory distress. She has no wheezes. She has no rales.  Abdominal: Bowel sounds are normal. She exhibits no distension and no mass. There is no tenderness. There is no rebound and no guarding.  Lymphadenopathy:    She has no cervical adenopathy.  Neurological: She is alert and oriented to person, place, and time. She has normal reflexes. No cranial nerve deficit.  CN II-XII grossly intact. Muscle strength 5/5 upper and lower extremities.   Skin: Skin is warm and dry. No rash noted. No erythema.  Psychiatric: She has a normal mood and affect. Her behavior is normal.    Hgb A1c: 6.1    Assessment and Plan:  Sherice was seen today for annual exam.  Diagnoses and all orders for this visit:  1. Annual physical exam     -Complete physical exam today in office.      -MM DIGITAL SCREENING BILATERAL. Last mammogram was fulfilled on 06/19/2013     -Flu Vaccine QUAD 36+ mos PF IM (Fluarix & Fluzone Quad Pf) given in the office today.       -Pneumococcal polysaccharide vaccine 23-valent greater than or equal to 2yo subcutaneous/IM given in the office today.    2. Controlled type 2 diabetes mellitus without complication, without long-term current use of insulin (HCC) -POCT HgB A1C- 6.1 today. Will re-check in 6 months.  -Refilled metFORMIN (GLUCOPHAGE) 1000 MG tablet; TAKE 1 TABLET (1,000 MG TOTAL) BY MOUTH 2 (TWO) TIMES DAILY WITH A MEAL. -Refilled saxagliptin HCl (ONGLYZA) 2.5 MG TABS tablet; Take 1 tablet (2.5 mg total) by mouth daily. -Continue Metformin and Onglyza for diabetes. Patient well controled.  -Follow-up in 6 months to re-check A1c.   3. Essential hypertension, benign -Refilled hydrochlorothiazide (HYDRODIURIL) 25 MG tablet; Take 1 tablet (25 mg total) by mouth daily. -Refilled atenolol (TENORMIN) 25 MG tablet; Take 1 tablet (25 mg total) by mouth daily. -BP  well controlled. 134/71 in office today.   4. Light stools -Hepatitis C antibody due patient needing screening, born in [birth year]. Patient is also experiencing light colored stools.  -COMPLETE METABOLIC PANEL WITH GFR. Specifically looking at AST, ALT, Alk Phos and total bilirubin.  -Advised patient to keep a food log due to white colored stools coming and going.   5. Laryngitis -PredniSONE (DELTASONE) 50 MG tablet; Take one tablet for 5 days to help with inflammation from laryngitis.  -Chlorpheniramine-HYDROcodone ([place]) 10-8 MG/5ML SUER; Take 5 mLs by mouth at bedtime as needed for cough.   6. Mixed hyperlipidemia - Refilled Atorvastatin (LIPITOR) 10 MG tablet; Take 1 tablet (10 mg total) by mouth daily for high cholesterol.

## 2016-03-17 LAB — COMPLETE METABOLIC PANEL WITH GFR
ALBUMIN: 4.2 g/dL (ref 3.6–5.1)
ALK PHOS: 45 U/L (ref 33–130)
ALT: 37 U/L — AB (ref 6–29)
AST: 30 U/L (ref 10–35)
BILIRUBIN TOTAL: 0.3 mg/dL (ref 0.2–1.2)
BUN: 11 mg/dL (ref 7–25)
CALCIUM: 9.6 mg/dL (ref 8.6–10.4)
CO2: 29 mmol/L (ref 20–31)
CREATININE: 0.71 mg/dL (ref 0.50–1.05)
Chloride: 100 mmol/L (ref 98–110)
GFR, Est African American: >89 mL/min (ref >=60)
GFR, Est Non African American: >89 mL/min (ref >=60)
Glucose, Bld: 116 mg/dL — ABNORMAL HIGH (ref 65–99)
POTASSIUM: 4.3 mmol/L (ref 3.5–5.3)
Sodium: 138 mmol/L (ref 135–146)
TOTAL PROTEIN: 6.8 g/dL (ref 6.1–8.1)

## 2016-03-17 LAB — HEPATITIS C ANTIBODY: HCV Ab: NEGATIVE

## 2016-03-17 NOTE — Progress Notes (Signed)
Call pt: ALT just slightly elevated. No hx of this. Will recheck in 1 month. Avoid tylenol and any excessive alcohol.  Hepatitis C negative.  A1C is 6.1. Great. If she needs refills give for 6 months.

## 2016-04-26 ENCOUNTER — Other Ambulatory Visit: Payer: Self-pay | Admitting: Physician Assistant

## 2016-05-26 ENCOUNTER — Ambulatory Visit (INDEPENDENT_AMBULATORY_CARE_PROVIDER_SITE_OTHER): Payer: BLUE CROSS/BLUE SHIELD | Admitting: Physician Assistant

## 2016-05-26 ENCOUNTER — Encounter: Payer: Self-pay | Admitting: Physician Assistant

## 2016-05-26 VITALS — BP 131/84 | HR 87 | Temp 98.6°F | Wt 180.0 lb

## 2016-05-26 DIAGNOSIS — R748 Abnormal levels of other serum enzymes: Secondary | ICD-10-CM | POA: Diagnosis not present

## 2016-05-26 DIAGNOSIS — J069 Acute upper respiratory infection, unspecified: Secondary | ICD-10-CM

## 2016-05-26 MED ORDER — IPRATROPIUM BROMIDE 0.06 % NA SOLN: 1.0000 | Freq: Four times a day (QID) | NASAL | 0 refills | Status: DC | PRN

## 2016-05-26 MED ORDER — AMOXICILLIN-POT CLAVULANATE 875-125 MG PO TABS: 1.0000 | ORAL_TABLET | Freq: Two times a day (BID) | ORAL | 0 refills | Status: AC

## 2016-05-26 MED ORDER — BENZONATATE 200 MG PO CAPS: 200.0000 mg | ORAL_CAPSULE | Freq: Three times a day (TID) | ORAL | 0 refills | Status: DC | PRN

## 2016-05-26 NOTE — Patient Instructions (Addendum)
Drink lots of fluids Take antibiotic twice a day for 10 days as prescribed Tessalon as needed for cough up to three times per day Atrovent nasal spray as needed for congestion up to four times per day Mucinex every 4 hours with at least 8 oz of water to help make cough more productive

## 2016-05-26 NOTE — Progress Notes (Signed)
HPI:                                                                Diane Brooks is a 61 y.o. female who presents to [facility] today for flu-like symptoms  Patient with history of DMI II and HTN complains of malaise, myalgias, sore throat, congestion, and nonproductive cough x 4 days. Denies fever, chills, n/v/d, or abdominal pain. Patient reports recent travel to [place] States she had to sleep in the airport due to inclement weather over the weekend and began to feel ill during that time. She is a former smoker, quit >10 years ago. Denies history of asthma, COPD.    Past Medical History:  Diagnosis Date  . Diabetes mellitus without complication ([place])   . Hyperlipidemia   . Hypertension    Past Surgical History:  Procedure Laterality Date  . BREAST ENHANCEMENT SURGERY    . ESOPHAGEAL MANOMETRY N/A 07/02/2013   Procedure: ESOPHAGEAL MANOMETRY (EM);  Surgeon: Jerene Bears, MD;  Location: [facility];  Service: Gastroenterology;  Laterality: N/A;  . TONSILECTOMY/ADENOIDECTOMY WITH MYRINGOTOMY    . TUBAL LIGATION    . tummy tuck     Social History  Substance Use Topics  . Smoking status: Former Smoker    Quit date: 03/23/1991  . Smokeless tobacco: Never Used  . Alcohol use No   family history includes Diabetes in her father, maternal grandfather, and paternal grandfather; Hyperlipidemia in her father, maternal grandfather, mother, and paternal grandfather; Hypertension in her father.  ROS: negative except as noted in the HPI  Medications: Current Outpatient Prescriptions  Medication Sig Dispense Refill  . AMBULATORY NON FORMULARY MEDICATION Freestyle Glucometer   Dx: 250.00 DM, type II 1 Device 0  . AMBULATORY NON FORMULARY MEDICATION Freestyle test strips.   DX Type II diabetes 100 strip 1  . atenolol (TENORMIN) 25 MG tablet Take 1 tablet (25 mg total) by mouth daily. 90 tablet 1  . atorvastatin (LIPITOR) 10 MG  tablet Take 1 tablet (10 mg total) by mouth daily. 90 tablet 1  . hydrochlorothiazide (HYDRODIURIL) 25 MG tablet Take 1 tablet (25 mg total) by mouth daily. 90 tablet 1  . metFORMIN (GLUCOPHAGE) 1000 MG tablet TAKE 1 TABLET (1,000 MG TOTAL) BY MOUTH 2 (TWO) TIMES DAILY WITH A MEAL. 180 tablet 0  . saxagliptin HCl (ONGLYZA) 2.5 MG TABS tablet Take 1 tablet (2.5 mg total) by mouth daily. 90 tablet 0   No current facility-administered medications for this visit.    No Known Allergies     Objective:  BP 131/84   Pulse 87   Temp 98.6 F (37 C) (Oral)   Wt 180 lb (81.6 kg)   BMI 32.40 kg/m  Gen: well-groomed, cooperative, not ill-appearing, no distress HEENT: normal conjunctiva, left ear canal occluded by cerumen, right TM clear, nasal mucosa edematous, oropharynx patent with post-nasal secretions, no exudates, uvula midline,  moist mucus membranes, there is left maxillary sinus tenderness Pulm: Normal work of breathing, voice is hoarse, clear to auscultation bilaterally, no wheezes, rales or rhonchi CV: Normal rate, regular rhythm, s1 and s2 distinct, no murmurs, clicks or rubs  Neuro: alert and oriented x 3, EOM's intact Lymph: no cervical or tonsillar adenopathy  Skin: warm and dry, no rashes or lesions on exposed skin, no cyanosis   No results found for this or any previous visit (from the past 72 hour(s)). No results found.    Assessment and Plan: 61 y.o. female with   1. Acute upper respiratory infection of multiple sites - given patient's recent travel and underlying diabetes, will cover with Augmentin, though this is likely a viral URI - symptomatic management with mucinex, nasal spray and tessalon - push PO fluids - amoxicillin-clavulanate (AUGMENTIN) 875-125 MG tablet; Take 1 tablet by mouth 2 (two) times daily.  Dispense: 20 tablet; Refill: 0 - benzonatate (TESSALON) 200 MG capsule; Take 1 capsule (200 mg total) by mouth 3 (three) times daily as needed for cough.   Dispense: 45 capsule; Refill: 0 - ipratropium (ATROVENT) 0.06 % nasal spray; Place 1 spray into both nostrils 4 (four) times daily as needed.  Dispense: 15 mL; Refill: 0  2. Elevated liver enzymes Lab Results  Component Value Date   ALT 37 (H) 03/16/2016   AST 30 03/16/2016   ALKPHOS 45 03/16/2016   BILITOT 0.3 03/16/2016  - mildly elevated ALT. Patient desires recheck - COMPLETE METABOLIC PANEL WITH GFR  Patient education and anticipatory guidance given Patient agrees with treatment plan Follow-up as needed if symptoms worsen or fail to improve  Darlyne Russian PA-C

## 2016-05-27 LAB — COMPLETE METABOLIC PANEL WITH GFR
ALT: 18 U/L (ref 6–29)
AST: 15 U/L (ref 10–35)
Albumin: 4 g/dL (ref 3.6–5.1)
Alkaline Phosphatase: 54 U/L (ref 33–130)
BUN: 13 mg/dL (ref 7–25)
CHLORIDE: 101 mmol/L (ref 98–110)
CO2: 32 mmol/L — AB (ref 20–31)
Calcium: 9.4 mg/dL (ref 8.6–10.4)
Creat: 0.85 mg/dL (ref 0.50–1.05)
GFR, EST AFRICAN AMERICAN: 87 mL/min (ref >=60)
GFR, EST NON AFRICAN AMERICAN: 76 mL/min (ref >=60)
Glucose, Bld: 130 mg/dL — ABNORMAL HIGH (ref 65–99)
POTASSIUM: 4.4 mmol/L (ref 3.5–5.3)
Sodium: 141 mmol/L (ref 135–146)
Total Bilirubin: 0.2 mg/dL (ref 0.2–1.2)
Total Protein: 6.9 g/dL (ref 6.1–8.1)

## 2016-06-16 ENCOUNTER — Ambulatory Visit (INDEPENDENT_AMBULATORY_CARE_PROVIDER_SITE_OTHER): Payer: BLUE CROSS/BLUE SHIELD | Admitting: Physician Assistant

## 2016-06-16 ENCOUNTER — Encounter: Payer: Self-pay | Admitting: Physician Assistant

## 2016-06-16 VITALS — BP 124/83 | HR 77 | Ht 62.5 in | Wt 178.0 lb

## 2016-06-16 DIAGNOSIS — E119 Type 2 diabetes mellitus without complications: Secondary | ICD-10-CM

## 2016-06-16 DIAGNOSIS — R011 Cardiac murmur, unspecified: Secondary | ICD-10-CM | POA: Diagnosis not present

## 2016-06-16 DIAGNOSIS — I10 Essential (primary) hypertension: Secondary | ICD-10-CM

## 2016-06-16 LAB — POCT GLYCOSYLATED HEMOGLOBIN (HGB A1C): HEMOGLOBIN A1C: 6.1

## 2016-06-16 MED ORDER — HYDROCHLOROTHIAZIDE 25 MG PO TABS: 25.0000 mg | ORAL_TABLET | Freq: Every day | ORAL | 1 refills | Status: DC

## 2016-06-16 MED ORDER — METFORMIN HCL 1000 MG PO TABS: ORAL_TABLET | ORAL | 1 refills | Status: DC

## 2016-06-16 MED ORDER — SAXAGLIPTIN HCL 2.5 MG PO TABS: 2.5000 mg | ORAL_TABLET | Freq: Every day | ORAL | 1 refills | Status: DC

## 2016-06-16 MED ORDER — SAXAGLIPTIN HCL 2.5 MG PO TABS: 2.5000 mg | ORAL_TABLET | Freq: Every day | ORAL | 0 refills | Status: DC

## 2016-06-16 MED ORDER — METFORMIN HCL 1000 MG PO TABS: ORAL_TABLET | ORAL | 0 refills | Status: DC

## 2016-06-16 NOTE — Progress Notes (Signed)
   Subjective:    Patient ID: Diane Brooks, female    DOB: Jun 15, 1955, 61 y.o.   MRN: [id number]  HPI Pt is a 61 yo female who presents to the clinic for 3 month follow up.   HtN- doing great. Denies any CP, palpitations, headaches. HCTZ is doing great.   DM- not checking sugars. She is watching carbs. She is not exercising. Taking medication daily. Aware of need for eye exam. No open sores or wounds. No hypoglycemia.   Review of Systems  All other systems reviewed and are negative.      Objective:   Physical Exam  Constitutional: She is oriented to person, place, and time. She appears well-developed and well-nourished.  HENT:  Head: Normocephalic and atraumatic.  Neck: Normal range of motion. Neck supple.  Cardiovascular: Normal rate and regular rhythm.   Murmur heard. 2/6 systolic right upper sternal border murmur.   Pulmonary/Chest: Effort normal and breath sounds normal.  Lymphadenopathy:    She has no cervical adenopathy.  Neurological: She is alert and oriented to person, place, and time.  Psychiatric: She has a normal mood and affect. Her behavior is normal.          Assessment & Plan:  [name] KitchenMarland KitchenCorrinna was seen today for diabetes.  Diagnoses and all orders for this visit:  Controlled type 2 diabetes mellitus without complication, without long-term current use of insulin (HCC) -     POCT HgB A1C -     Discontinue: metFORMIN (GLUCOPHAGE) 1000 MG tablet; TAKE 1 TABLET (1,000 MG TOTAL) BY MOUTH 2 (TWO) TIMES DAILY WITH A MEAL. -     Discontinue: saxagliptin HCl (ONGLYZA) 2.5 MG TABS tablet; Take 1 tablet (2.5 mg total) by mouth daily. -     metFORMIN (GLUCOPHAGE) 1000 MG tablet; TAKE 1 TABLET (1,000 MG TOTAL) BY MOUTH 2 (TWO) TIMES DAILY WITH A MEAL. -     saxagliptin HCl (ONGLYZA) 2.5 MG TABS tablet; Take 1 tablet (2.5 mg total) by mouth daily.  Essential hypertension, benign -     hydrochlorothiazide (HYDRODIURIL) 25 MG tablet; Take 1 tablet (25 mg total) by mouth  daily.  Systolic murmur   .[name] Kitchen Lab Results  Component Value Date   HGBA1C 6.1 06/16/2016   Great a1c. Keep up the good work.  Encouraged eye exam.  Stay on same medications.  Follow up in 3 months.    Pt has been told off and on for years she has had murmur. No SOB, edema or exercise intolerance. She declines further work up as this point.    Encouraged to get mammogram.

## 2016-06-18 ENCOUNTER — Encounter: Payer: Self-pay | Admitting: Physician Assistant

## 2016-06-18 DIAGNOSIS — R011 Cardiac murmur, unspecified: Secondary | ICD-10-CM | POA: Insufficient documentation

## 2016-11-15 ENCOUNTER — Ambulatory Visit: Payer: BLUE CROSS/BLUE SHIELD | Admitting: Physician Assistant

## 2016-11-23 ENCOUNTER — Encounter: Payer: Self-pay | Admitting: Obstetrics & Gynecology

## 2016-11-23 ENCOUNTER — Ambulatory Visit (INDEPENDENT_AMBULATORY_CARE_PROVIDER_SITE_OTHER): Payer: BLUE CROSS/BLUE SHIELD | Admitting: Obstetrics & Gynecology

## 2016-11-23 VITALS — BP 125/76 | HR 76 | Resp 16 | Ht 62.5 in | Wt 178.0 lb

## 2016-11-23 DIAGNOSIS — N950 Postmenopausal bleeding: Secondary | ICD-10-CM

## 2016-11-23 DIAGNOSIS — Z01419 Encounter for gynecological examination (general) (routine) without abnormal findings: Secondary | ICD-10-CM | POA: Diagnosis not present

## 2016-11-23 DIAGNOSIS — Z1151 Encounter for screening for human papillomavirus (HPV): Secondary | ICD-10-CM | POA: Diagnosis not present

## 2016-11-23 DIAGNOSIS — Z124 Encounter for screening for malignant neoplasm of cervix: Secondary | ICD-10-CM

## 2016-11-23 NOTE — Progress Notes (Signed)
Subjective:    Diane Brooks is a 61 y.o. M H P2 (33 and 49yo kids, 2 1/2 grands) female who presents for an annual exam. She had some old brown blood in the middle of July. The patient is sexually active. GYN screening history: last pap: was normal. The patient wears seatbelts: yes. The patient participates in regular exercise: yes. Has the patient ever been transfused or tattooed?: yes. The patient reports that there is not domestic violence in her life.   Menstrual History: OB History    Gravida Para Term Preterm AB Living   3 2     1 2    SAB TAB Ectopic Multiple Live Births   1              Menarche age: 40 No LMP recorded. Patient is postmenopausal.    The following portions of the patient's history were reviewed and updated as appropriate: allergies, current medications, past family history, past medical history, past social history, past surgical history and problem list.  Review of Systems Pertinent items are noted in HPI.   FH- no breast/gyn/colon cancer, no cancers Mammogram UTD and reportedly normal 05/2016 DEXA normal 2017 Married for 37 years Colonoscopy due in 2022   Objective:    BP 125/76   Pulse 76   Resp 16   Ht 5' 2.5" (1.588 m)   Wt 178 lb (80.7 kg)   BMI 32.04 kg/m   General Appearance:    Alert, cooperative, no distress, appears stated age  Head:    Normocephalic, without obvious abnormality, atraumatic  Eyes:    PERRL, conjunctiva/corneas clear, EOM's intact, fundi    benign, both eyes  Ears:    Normal TM's and external ear canals, both ears  Nose:   Nares normal, septum midline, mucosa normal, no drainage    or sinus tenderness  Throat:   Lips, mucosa, and tongue normal; teeth and gums normal  Neck:   Supple, symmetrical, trachea midline, no adenopathy;    thyroid:  no enlargement/tenderness/nodules; no carotid   bruit or JVD  Back:     Symmetric, no curvature, ROM normal, no CVA tenderness  Lungs:     Clear to auscultation bilaterally,  respirations unlabored  Chest Wall:    No tenderness or deformity   Heart:    Regular rate and rhythm, S1 and S2 normal, no murmur, rub   or gallop  Breast Exam:    No tenderness, masses, or nipple abnormality, saline implants  Abdomen:     Soft, non-tender, bowel sounds active all four quadrants,    no masses, no organomegaly, s/p tummy tuck   Genitalia:    Normal female without lesion, discharge or tenderness, marked atrophy, normal cervix, NSSA, NT, normal adnexal exam     Extremities:   Extremities normal, atraumatic, no cyanosis or edema  Pulses:   2+ and symmetric all extremities  Skin:   Skin color, texture, turgor normal, no rashes or lesions  Lymph nodes:   Cervical, supraclavicular, and axillary nodes normal  Neurologic:   CNII-XII intact, normal strength, sensation and reflexes    throughout  .    Assessment:    Healthy female exam.    Plan:     Thin prep Pap smear. with cotesting Check gyn u/s for PMB

## 2016-11-24 ENCOUNTER — Ambulatory Visit (INDEPENDENT_AMBULATORY_CARE_PROVIDER_SITE_OTHER): Payer: BLUE CROSS/BLUE SHIELD | Admitting: Physician Assistant

## 2016-11-24 DIAGNOSIS — Z23 Encounter for immunization: Secondary | ICD-10-CM | POA: Diagnosis not present

## 2016-11-26 LAB — CYTOLOGY - PAP
Diagnosis: UNDETERMINED — AB
HPV: NOT DETECTED

## 2016-11-29 ENCOUNTER — Other Ambulatory Visit: Payer: BLUE CROSS/BLUE SHIELD

## 2016-12-07 ENCOUNTER — Encounter: Payer: BLUE CROSS/BLUE SHIELD | Admitting: Obstetrics & Gynecology

## 2016-12-07 ENCOUNTER — Encounter: Payer: Self-pay | Admitting: Obstetrics & Gynecology

## 2016-12-09 ENCOUNTER — Ambulatory Visit (INDEPENDENT_AMBULATORY_CARE_PROVIDER_SITE_OTHER): Payer: BLUE CROSS/BLUE SHIELD

## 2016-12-09 DIAGNOSIS — D259 Leiomyoma of uterus, unspecified: Secondary | ICD-10-CM

## 2016-12-09 DIAGNOSIS — N950 Postmenopausal bleeding: Secondary | ICD-10-CM

## 2016-12-10 NOTE — Progress Notes (Signed)
She came for her follow up visit but has not yet had an u/s.

## 2016-12-21 ENCOUNTER — Telehealth: Payer: Self-pay | Admitting: *Deleted

## 2016-12-21 ENCOUNTER — Encounter: Payer: Self-pay | Admitting: *Deleted

## 2016-12-21 MED ORDER — MISOPROSTOL 200 MCG PO TABS: ORAL_TABLET | ORAL | 0 refills | Status: DC

## 2016-12-21 NOTE — Telephone Encounter (Signed)
-----   Message from Emily Filbert, MD sent at 12/10/2016  9:07 AM EDT ----- She will need an EMBX. Please give her 600 mcg of cytotec orally the night prior to the case. Thanks

## 2016-12-21 NOTE — Telephone Encounter (Signed)
Letter mailed to pt's home address requesting that she call the office for appt for Endo Bx.  RX for Cytoec sent to her pharmacy to take the night prior to appt with Dr Hulan Fray.

## 2016-12-28 ENCOUNTER — Encounter: Payer: Self-pay | Admitting: Obstetrics & Gynecology

## 2016-12-28 ENCOUNTER — Other Ambulatory Visit (HOSPITAL_COMMUNITY)
Admission: RE | Admit: 2016-12-28 | Discharge: 2016-12-28 | Disposition: A | Discharge status: Home / Self Care | Payer: BLUE CROSS/BLUE SHIELD | Source: Ambulatory Visit | Attending: Obstetrics & Gynecology | Admitting: Obstetrics & Gynecology

## 2016-12-28 ENCOUNTER — Ambulatory Visit (INDEPENDENT_AMBULATORY_CARE_PROVIDER_SITE_OTHER): Payer: BLUE CROSS/BLUE SHIELD | Admitting: Obstetrics & Gynecology

## 2016-12-28 VITALS — BP 109/66 | HR 73 | Resp 16 | Ht 62.5 in | Wt 178.0 lb

## 2016-12-28 DIAGNOSIS — N950 Postmenopausal bleeding: Secondary | ICD-10-CM | POA: Diagnosis present

## 2016-12-28 NOTE — Progress Notes (Signed)
   Subjective:    Patient ID: Milee Qualls, female    DOB: 12/18/1955, 61 y.o.   MRN: [id number]  HPI 61 yo lady here for [facility]. She had PMB 7/18. Her u/s 9/18 showed a 7 mm endometrium.   Review of Systems She already had a flu vaccine. Her mammmogram was normal in 5/18.    Objective:   Physical Exam   UPT negative, consent signed, time out done Cervix prepped with betadine and grasped with a single tooth tenaculum Uterus sounded to 9 cm Pipelle used for 2 passes with a small amount of tissue obtained. She tolerated the procedure well.     Assessment & Plan:  PMB- await pathology

## 2016-12-28 NOTE — Addendum Note (Signed)
Addended by: Emily Filbert on: 12/28/2016 03:43 PM   Modules accepted: Level of Service

## 2017-01-03 ENCOUNTER — Telehealth: Payer: Self-pay | Admitting: *Deleted

## 2017-01-03 NOTE — Telephone Encounter (Signed)
Pt notified of neg pathology report.  She will f/u with Dr Hulan Fray in 1 year or PRN

## 2017-02-07 ENCOUNTER — Ambulatory Visit: Payer: BLUE CROSS/BLUE SHIELD | Admitting: Physician Assistant

## 2017-02-07 ENCOUNTER — Encounter: Payer: Self-pay | Admitting: Physician Assistant

## 2017-02-07 VITALS — BP 142/81 | HR 73 | Temp 98.3°F | Ht 63.0 in | Wt 175.0 lb

## 2017-02-07 DIAGNOSIS — J209 Acute bronchitis, unspecified: Secondary | ICD-10-CM

## 2017-02-07 MED ORDER — BENZONATATE 200 MG PO CAPS: 200.0000 mg | ORAL_CAPSULE | Freq: Three times a day (TID) | ORAL | 0 refills | Status: DC | PRN

## 2017-02-07 MED ORDER — GUAIFENESIN-CODEINE 100-10 MG/5ML PO SOLN: 5.0000 mL | Freq: Every evening | ORAL | 0 refills | Status: DC | PRN

## 2017-02-07 MED ORDER — PREDNISONE 50 MG PO TABS: 50.0000 mg | ORAL_TABLET | Freq: Every day | ORAL | 0 refills | Status: DC

## 2017-02-07 MED ORDER — ALBUTEROL SULFATE HFA 108 (90 BASE) MCG/ACT IN AERS: 1.0000 | INHALATION_SPRAY | RESPIRATORY_TRACT | 0 refills | Status: DC | PRN

## 2017-02-07 NOTE — Patient Instructions (Signed)
Bronquitis aguda en los adultos (Acute Bronchitis, Adult) La bronquitis aguda es la hinchazn repentina (aguda) de las vas areas (bronquios) en los pulmones. La bronquitis aguda hace que se llenen estas vas con mucosidad y provoca dificultad para respirar. Tambin puede causar tos o sibilancias. En los adultos, la bronquitis aguda generalmente desaparece en 2semanas. La tos provocada por la bronquitis puede durar [employer]. El hbito de fumar, las Set designer y el asma pueden empeorar esta afeccin. Los episodios repetidos de bronquitis pueden causar ms problemas pulmonares, como la enfermedad pulmonar obstructiva crnica (EPOC). CAUSAS Esta afeccin puede ser causada por grmenes y por sustancias que irritan los pulmones, por ejemplo:  Virus del resfro y de la gripe. La causa de la bronquitis suele ser el mismo virus que produce el resfro.  Bacterias.  Exposicin al humo del tabaco, polvo, gases y contaminacin del aire. FACTORES DE RIESGO Es ms probable que esta afeccin se manifieste en las personas que:  Tienen contacto cercano con alguien con bronquitis aguda.  Estn expuestas a sustancias que [employer], como el humo del [place], [place], gases y vapores.  Tienen un sistema inmunitario dbil.  Tienen una afeccin respiratoria, como el asma. SNTOMAS Los sntomas de esta afeccin incluyen lo siguiente:  Tos.  Despedir Ardelia Mems mucosidad transparente, amarilla o verde al toser.  Sibilancias.  Congestin en el pecho.  Falta de aire.  Cristy Hilts.  Dolores [employer] cuerpo.  Escalofros.  Dolor de Investment banker, operational. DIAGNSTICO Por lo general, esta afeccin se diagnostica mediante un examen fsico. Durante el examen, el mdico puede solicitar estudios, como radiografas de trax, para Clinical research associate. El mdico tambin puede hacer lo siguiente:  Dion Body de mucosidad para detectar una infeccin bacteriana.  Confirmar el nivel de oxgeno en la sangre.  Esto se hace para determinar si hay neumona.  Realizar una radiografa de trax o pruebas de la funcin pulmonar para descartar una neumona u otras afecciones.  Le pedir [employer]. El mdico tambin le preguntar sobre sus sntomas y los antecedentes mdicos. TRATAMIENTO La mayora de los casos de bronquitis [place] se recupera con el [place], sin tratamiento. El mdico podr indicar lo siguiente:  Beber ms lquidos. Beber ms cantidad de lquidos ayuda a diluir la mucosidad para Museum/gallery exhibitions officer respiracin.  Tomar un medicamento para la fiebre o la tos.  Tomar un antibitico.  Usar un inhalador para respirar mejor y Aeronautical engineer tos.  Usar un humidificador o vaporizador de niebla fra para facilitar la respiracin. [place] los medicamentos de venta libre y los recetados solamente como se lo haya indicado el mdico.  Si le recetaron un antibitico, tmelo como se lo haya indicado el mdico. No deje de tomar los antibiticos aunque comience a Sports administrator. Instrucciones generales  Descanse lo suficiente.  Beba suficiente lquido para Consulting civil engineer orina clara o de color amarillo plido.  Evite fumar o aspirar el humo de otros fumadores. La exposicin al humo del cigarrillo o a irritantes qumicos har que la bronquitis empeore. Si fuma y necesita ayuda para dejar de fumar, consulte al mdico. Si deja de fumar, sus pulmones se curarn ms rpido.  Utilice Educational psychologist, un humidificador o un vaporizador de niebla fra, como se lo haya indicado el mdico.  Concurra a todas las visitas de control como se lo haya indicado el mdico. Esto es importante. PREVENCIN Para disminuir el riesgo de volver a sufrir esta afeccin:  Marketing executive  con agua y [place]. Use desinfectante para manos si no dispone de [place] y [place].  Evite el contacto con personas que tienen sntomas de resfro.  Trate de no llevar las manos a  la boca, la nariz o los ojos.  Recuerde aplicarse la vacuna contra la gripe todos los [place]. SOLICITE ATENCIN MDICA SI:  Los sntomas no mejoran despus de 2semanas de tratamiento.  SOLICITE ATENCIN MDICA DE INMEDIATO SI:  Tose y escupe sangre.  Siente dolor en el pecho.  Le falta el aire de manera preocupante.  Se deshidrata.  Se desmaya o se siente como si se fuera a desmayar.  Sigue vomitando.  Tiene un dolor de cabeza intenso.  La fiebre o los escalofros empeoran.  Esta informacin no tiene Marine scientist el consejo del mdico. Asegrese de hacerle al mdico cualquier pregunta que tenga. Document Released: 03/08/2005 Document Revised: 03/29/2014 Document Reviewed: 08/27/2015 Elsevier Interactive Patient Education  2017 [employer].

## 2017-02-07 NOTE — Progress Notes (Signed)
HPI:                                                                Diane Brooks is a 61 y.o. female who presents to [place]: [place] today for cough  Cough  This is a new problem. The current episode started in the past 7 days. The problem has been gradually worsening. The problem occurs every few minutes. The cough is non-productive. Associated symptoms include chills, postnasal drip, a sore throat and shortness of breath. Pertinent negatives include no fever or rash. The symptoms are aggravated by lying down. Risk factors for lung disease include travel. She has tried OTC cough suppressant and rest for the symptoms. There is no history of asthma or COPD.     Past Medical History:  Diagnosis Date  . Diabetes mellitus without complication ([place])   . Hyperlipidemia   . Hypertension    Past Surgical History:  Procedure Laterality Date  . BREAST ENHANCEMENT SURGERY    . ESOPHAGEAL MANOMETRY (EM) N/A 07/02/2013   Performed by Jerene Bears, MD at [facility]  . TONSILECTOMY/ADENOIDECTOMY WITH MYRINGOTOMY    . TUBAL LIGATION    . tummy tuck     Social History   Tobacco Use  . Smoking status: Former Smoker    Last attempt to quit: 03/23/1991    Years since quitting: 25.8  . Smokeless tobacco: Never Used  Substance Use Topics  . Alcohol use: No   family history includes Diabetes in her father, maternal grandfather, and paternal grandfather; Hyperlipidemia in her father, maternal grandfather, mother, and paternal grandfather; Hypertension in her father.  ROS: negative except as noted in the HPI  Medications: Current Outpatient Medications  Medication Sig Dispense Refill  . AMBULATORY NON FORMULARY MEDICATION Freestyle Glucometer   Dx: 250.00 DM, type II 1 Device 0  . AMBULATORY NON FORMULARY MEDICATION Freestyle test strips.   DX Type II diabetes 100 strip 1  . atenolol (TENORMIN) 25 MG tablet Take 1 tablet (25 mg total) by mouth  daily. 90 tablet 1  . hydrochlorothiazide (HYDRODIURIL) 25 MG tablet Take 1 tablet (25 mg total) by mouth daily. 90 tablet 1  . metFORMIN (GLUCOPHAGE) 1000 MG tablet TAKE 1 TABLET (1,000 MG TOTAL) BY MOUTH 2 (TWO) TIMES DAILY WITH A MEAL. 180 tablet 1  . saxagliptin HCl (ONGLYZA) 2.5 MG TABS tablet Take 1 tablet (2.5 mg total) by mouth daily. 90 tablet 1  . albuterol (PROVENTIL HFA;VENTOLIN HFA) 108 (90 Base) MCG/ACT inhaler Inhale 1 puff every 4 (four) hours as needed into the lungs for wheezing. 1 Inhaler 0  . benzonatate (TESSALON) 200 MG capsule Take 1 capsule (200 mg total) 3 (three) times daily as needed by mouth for cough. 45 capsule 0  . guaiFENesin-codeine 100-10 MG/5ML syrup Take 5 mLs at bedtime as needed by mouth for cough. 180 mL 0  . predniSONE (DELTASONE) 50 MG tablet Take 1 tablet (50 mg total) daily by mouth. 5 tablet 0   No current facility-administered medications for this visit.    No Known Allergies     Objective:  BP (!) 142/81   Pulse 73   Temp 98.3 F (36.8 C) (Oral)   Ht 5\' 3"  (1.6 m)  Wt 175 lb (79.4 kg)   SpO2 99%   BMI 31.00 kg/m  Gen:  alert, not ill-appearing, no distress, appropriate for age 4: head normocephalic without obvious abnormality, conjunctiva and cornea clear, wearing glasses, oropharynx clear, neck supple, no adenopathy, trachea midline Pulm: Normal work of breathing, normal phonation, clear to auscultation bilaterally, no wheezes, rales or rhonchi CV: Normal rate, regular rhythm, s1 and s2 distinct, no murmurs, clicks or rubs  Neuro: alert and oriented x 3, no tremor MSK: extremities atraumatic, normal gait and station Skin: intact, no rashes on exposed skin, no jaundice, no cyanosis   No results found for this or any previous visit (from the past 72 hour(s)). No results found.    Assessment and Plan: 61 y.o. female with   1. Acute bronchitis, unspecified organism - vital signs reviewed and normal, SpO2 99% on RA at rest,  lungs CTA, bronchitic cough. Symptomatic management with steroid burst, albuterol inhaler, and cough suppressant. - predniSONE (DELTASONE) 50 MG tablet; Take 1 tablet (50 mg total) daily by mouth.  Dispense: 5 tablet; Refill: 0 - benzonatate (TESSALON) 200 MG capsule; Take 1 capsule (200 mg total) 3 (three) times daily as needed by mouth for cough.  Dispense: 45 capsule; Refill: 0 - guaiFENesin-codeine 100-10 MG/5ML syrup; Take 5 mLs at bedtime as needed by mouth for cough.  Dispense: 180 mL; Refill: 0 - albuterol (PROVENTIL HFA;VENTOLIN HFA) 108 (90 Base) MCG/ACT inhaler; Inhale 1 puff every 4 (four) hours as needed into the lungs for wheezing.  Dispense: 1 Inhaler; Refill: 0     Patient education and anticipatory guidance given Patient agrees with treatment plan Follow-up as needed if symptoms worsen or fail to improve  Darlyne Russian PA-C

## 2017-02-14 ENCOUNTER — Ambulatory Visit: Payer: BLUE CROSS/BLUE SHIELD | Admitting: Physician Assistant

## 2017-02-14 ENCOUNTER — Telehealth: Payer: Self-pay | Admitting: Physician Assistant

## 2017-02-14 ENCOUNTER — Encounter: Payer: Self-pay | Admitting: Physician Assistant

## 2017-02-14 VITALS — BP 149/69 | HR 70 | Ht 62.99 in | Wt 176.0 lb

## 2017-02-14 DIAGNOSIS — J329 Chronic sinusitis, unspecified: Secondary | ICD-10-CM | POA: Diagnosis not present

## 2017-02-14 DIAGNOSIS — J40 Bronchitis, not specified as acute or chronic: Secondary | ICD-10-CM

## 2017-02-14 DIAGNOSIS — R059 Cough, unspecified: Secondary | ICD-10-CM

## 2017-02-14 DIAGNOSIS — E119 Type 2 diabetes mellitus without complications: Secondary | ICD-10-CM

## 2017-02-14 DIAGNOSIS — R05 Cough: Secondary | ICD-10-CM | POA: Diagnosis not present

## 2017-02-14 DIAGNOSIS — J209 Acute bronchitis, unspecified: Secondary | ICD-10-CM | POA: Diagnosis not present

## 2017-02-14 LAB — POCT GLYCOSYLATED HEMOGLOBIN (HGB A1C): Hemoglobin A1C: 6.4

## 2017-02-14 MED ORDER — IPRATROPIUM-ALBUTEROL 0.5-2.5 (3) MG/3ML IN SOLN
3.0000 mL | Freq: Once | RESPIRATORY_TRACT | Status: AC
  Administered 2017-02-14: 3 mL via RESPIRATORY_TRACT

## 2017-02-14 MED ORDER — METHYLPREDNISOLONE SODIUM SUCC 125 MG IJ SOLR
125.0000 mg | Freq: Once | INTRAMUSCULAR | Status: AC
  Administered 2017-02-14: 125 mg via INTRAMUSCULAR

## 2017-02-14 MED ORDER — AZITHROMYCIN 250 MG PO TABS: ORAL_TABLET | ORAL | 0 refills | Status: DC

## 2017-02-14 NOTE — Patient Instructions (Signed)

## 2017-02-14 NOTE — Progress Notes (Signed)
Subjective:    Patient ID: Diane Brooks, female    DOB: Jan 30, 1956, 61 y.o.   MRN: [id number]  HPI  Pt is a 61 yo pleasant female who presents to the clinic to discuss elevated blood sugars and cough.   She was seen on 11/59, 8 days ago and given albuterol inhaler, benzonatate, cough syrup, and prednisone. For 2 days her sugars fasting were in the 160's so she stopped everything except the cough syrup. Now sugars back to 108 this am.   She continues to cough and now lost her voice. No fever or chills. She does feel like she has no energy. cough is mostly dry. She is having more sinus pressure and head congestion. Her ears are congested.   .. Active Ambulatory Problems    Diagnosis Date Noted  . Hyperlipidemia 03/28/2013  . Hiatal hernia 03/28/2013  . Essential hypertension, benign 03/28/2013  . Diabetes mellitus type II, controlled ([place]) 03/28/2013  . GERD (gastroesophageal reflux disease) 05/10/2013  . Globus sensation 05/10/2013  . Atypical chest pain 05/10/2013  . Internal hemorrhoids 05/10/2013  . Decreased libido 05/10/2013  . High risk HPV infection 05/24/2013  . Colon cancer screening 05/25/2013  . Obesity (BMI 30-39.9) 06/27/2013  . Diabetes mellitus ([place]) 06/27/2013  . Abnormal weight gain 06/27/2013  . Hyperlipemia 06/27/2013  . Cyst, eyelid sebaceous 06/27/2013  . Onychomycosis 06/27/2013  . 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 Sudoriferous cyst 07/17/2013  . Seasonal allergies 08/03/2013  . Allergic rhinitis 08/03/2013  . Tinea versicolor 08/03/2013  . Obesity, unspecified 10/29/2013  . Hot flashes 10/29/2013  . Abdominal pain, epigastric 11/09/2013  . Migraine headache without aura 10/16/2015  . Mid back pain 10/24/2015  . Left-sided low back pain with left-sided sciatica 10/24/2015  . Neck pain 10/24/2015  . De Quervain's disease (radial styloid tenosynovitis) 11/14/2015  . BPV (benign positional vertigo) 11/14/2015  . Muscle spasm of back 11/14/2015  .  Myalgia and myositis 11/19/2015  . Mouth sores 12/15/2015  . Black stools 12/15/2015  . Light stools 03/03/2016  . Systolic murmur 83/66/2947   Resolved Ambulatory Problems    Diagnosis Date Noted  . No Resolved Ambulatory Problems   Past Medical History:  Diagnosis Date  . Diabetes mellitus without complication ([place])   . Hyperlipidemia   . Hypertension      Review of Systems  All other systems reviewed and are negative.      Objective:   Physical Exam  Constitutional: She is oriented to person, place, and time. She appears well-developed and well-nourished.  HENT:  Head: Normocephalic and atraumatic.  Right Ear: External ear normal.  Left Ear: External ear normal.  TM"s erythematous.  Oropharynx erythematous with PND. No tonsil exudate or swelling.  Fullness over maxillary sinuses to palpation.  Nasal turbinates and red.   Eyes: Conjunctivae are normal. Right eye exhibits no discharge. Left eye exhibits no discharge.  Neck: Normal range of motion. Neck supple.  Cardiovascular: Normal rate and regular rhythm.  3/6 SEM  Pulmonary/Chest: Effort normal and breath sounds normal. She has no wheezes.  Lymphadenopathy:    She has cervical adenopathy.  Neurological: She is alert and oriented to person, place, and time.  Psychiatric: She has a normal mood and affect. Her behavior is normal.          Assessment & Plan:  [name] KitchenMarland KitchenNour was seen today for diabetes.  Diagnoses and all orders for this visit:  Cough -     DG Chest 2 View; Future  Controlled type 2 diabetes mellitus without complication, without long-term current use of insulin (HCC) -     POCT HgB A1C  Acute bronchitis, unspecified organism -     DG Chest 2 View; Future -     methylPREDNISolone sodium succinate (SOLU-MEDROL) 125 mg/2 mL injection 125 mg -     ipratropium-albuterol (DUONEB) 0.5-2.5 (3) MG/3ML nebulizer solution 3 mL  Sinobronchitis -     azithromycin (ZITHROMAX) 250 MG tablet; Take 2  tablets now and then one tablet for 4 days.     .. Lab Results  Component Value Date   HGBA1C 6.4 02/14/2017    Peak flows red and yellow. duoneb nebulizer given in office today.  Solumedrol given that hopefully will help inflammation but not raise her sugars as much. Discussed even if it does if will be temporarily.  Start zpak for concomitant sinus infection.  CXR was ordered due to persistent nature of cough.  Discussed symptomatic care for cough with huimidifer, cough drops, rest, hydration, and time.  Overall a1c up a little but still controlled. Recheck in 3 months.   Follow up as needed or if not improving.

## 2017-02-14 NOTE — Telephone Encounter (Signed)
Pt called triage stating she isn't feeling better, she has an appointment today.

## 2017-02-16 ENCOUNTER — Telehealth: Payer: Self-pay | Admitting: Physician Assistant

## 2017-02-16 ENCOUNTER — Encounter: Payer: Self-pay | Admitting: Physician Assistant

## 2017-02-16 NOTE — Telephone Encounter (Signed)
Can we call patient. She didn't get CXR done and I wondered if she was feeling better from her cough.

## 2017-02-18 NOTE — Telephone Encounter (Signed)
Spoke with pt & she is feeling a lot better.

## 2017-02-23 ENCOUNTER — Encounter: Payer: Self-pay | Admitting: Physician Assistant

## 2017-03-17 ENCOUNTER — Ambulatory Visit (INDEPENDENT_AMBULATORY_CARE_PROVIDER_SITE_OTHER): Payer: BLUE CROSS/BLUE SHIELD | Admitting: Obstetrics & Gynecology

## 2017-03-17 ENCOUNTER — Encounter: Payer: Self-pay | Admitting: Obstetrics & Gynecology

## 2017-03-17 VITALS — BP 157/84 | HR 84 | Resp 16 | Ht 62.5 in | Wt 178.0 lb

## 2017-03-17 DIAGNOSIS — L292 Pruritus vulvae: Secondary | ICD-10-CM | POA: Diagnosis not present

## 2017-03-17 MED ORDER — FLUCONAZOLE 150 MG PO TABS: 150.0000 mg | ORAL_TABLET | Freq: Once | ORAL | 3 refills | Status: AC

## 2017-03-17 NOTE — Progress Notes (Signed)
   Subjective:    Patient ID: Diane Brooks, female    DOB: 1955-03-29, 61 y.o.   MRN: [id number]  HPI 61 yo lady here because she is still having some old brown spotting. She had totally normal EMBX 10/18. She also has some vulvar itching for about 3 weeks.   Review of Systems     Objective:   Physical Exam Breathing, conversing, and ambulating normally Well nourished, well hydrated Hispanic female, no apparent distress Vulva has atrophy but no specific findings to explain her itching.  U/s from 9/18: IMPRESSION: Numerous fibroids throughout the uterus.  No acute findings.    Assessment & Plan:  Continued old brown spotting I offered her a d&c if she is worried, but I feel that the cause is the fibroids, not a malignancy. At this time she prefers watchful waiting. She is aware that my sugeries are usually booking 2 months in advance.  I will prescribe a diflucan. If the itching continues, I will call in estrogen topically.

## 2017-03-18 ENCOUNTER — Encounter: Payer: Self-pay | Admitting: Family Medicine

## 2017-03-18 ENCOUNTER — Ambulatory Visit (INDEPENDENT_AMBULATORY_CARE_PROVIDER_SITE_OTHER): Payer: BLUE CROSS/BLUE SHIELD

## 2017-03-18 ENCOUNTER — Ambulatory Visit (INDEPENDENT_AMBULATORY_CARE_PROVIDER_SITE_OTHER): Payer: BLUE CROSS/BLUE SHIELD | Admitting: Family Medicine

## 2017-03-18 VITALS — BP 158/77 | HR 89 | Resp 16 | Wt 179.0 lb

## 2017-03-18 DIAGNOSIS — M5414 Radiculopathy, thoracic region: Secondary | ICD-10-CM | POA: Diagnosis not present

## 2017-03-18 DIAGNOSIS — M545 Low back pain, unspecified: Secondary | ICD-10-CM

## 2017-03-18 MED ORDER — MELOXICAM 15 MG PO TABS: 15.0000 mg | ORAL_TABLET | Freq: Every day | ORAL | 0 refills | Status: DC

## 2017-03-18 MED ORDER — CYCLOBENZAPRINE HCL 5 MG PO TABS: 5.0000 mg | ORAL_TABLET | Freq: Three times a day (TID) | ORAL | 1 refills | Status: DC | PRN

## 2017-03-18 NOTE — Patient Instructions (Addendum)
Thank you for coming in today. Use a heating pad.  Use a TENS unit.  Attend PT.  Recheck in 4 weeks if not better.  Come back or go to the emergency room if you notice new weakness new numbness problems walking or bowel or bladder problems.  TENS UNIT: This is helpful for muscle pain and spasm.   Search and Purchase a TENS 7000 2nd edition at  www.tenspros.com or www.[place].com It should be less than $30.     TENS unit instructions: Do not shower or bathe with the unit on Turn the unit off before removing electrodes or batteries If the electrodes lose stickiness add a drop of water to the electrodes after they are disconnected from the unit and place on plastic sheet. If you continued to have difficulty, call the TENS unit company to purchase more electrodes. Do not apply lotion on the skin area prior to use. Make sure the skin is clean and dry as this will help prolong the life of the electrodes. After use, always check skin for unusual red areas, rash or other skin difficulties. If there are any skin problems, does not apply electrodes to the same area. Never remove the electrodes from the unit by pulling the wires. Do not use the TENS unit or electrodes other than as directed. Do not change electrode placement without consultating your therapist or physician. Keep 2 fingers with between each electrode. Wear time ratio is 2:1, on to off times.    For example on for 30 minutes off for 15 minutes and then on for 30 minutes off for 15 minutes

## 2017-03-18 NOTE — Progress Notes (Signed)
Diane Brooks is a 61 y.o. female who presents to [place] [facility] today for back pain.  [name] notes pain in her left thoracic back radiating to her left neck.  This is been ongoing for about 2 weeks.  She denies any injury bowel bladder dysfunction or radiating pain.  She denies any fevers or chills.  She is tried over-the-counter medications which have helped a bit.  She has a history of DDD of the lumbar thoracic and cervical spine.  She also has osteopenia.   Past Medical History:  Diagnosis Date  . Diabetes mellitus without complication ([place])   . Hyperlipidemia   . Hypertension    Past Surgical History:  Procedure Laterality Date  . BREAST ENHANCEMENT SURGERY    . ESOPHAGEAL MANOMETRY N/A 07/02/2013   Procedure: ESOPHAGEAL MANOMETRY (EM);  Surgeon: Jerene Bears, MD;  Location: [facility];  Service: Gastroenterology;  Laterality: N/A;  . TONSILECTOMY/ADENOIDECTOMY WITH MYRINGOTOMY    . TUBAL LIGATION    . tummy tuck     Social History   Tobacco Use  . Smoking status: Former Smoker    Last attempt to quit: 03/23/1991    Years since quitting: 26.0  . Smokeless tobacco: Never Used  Substance Use Topics  . Alcohol use: No     ROS:  As above   Medications: Current Outpatient Medications  Medication Sig Dispense Refill  . albuterol (PROVENTIL HFA;VENTOLIN HFA) 108 (90 Base) MCG/ACT inhaler Inhale 1 puff every 4 (four) hours as needed into the lungs for wheezing. 1 Inhaler 0  . AMBULATORY NON FORMULARY MEDICATION Freestyle Glucometer   Dx: 250.00 DM, type II 1 Device 0  . AMBULATORY NON FORMULARY MEDICATION Freestyle test strips.   DX Type II diabetes 100 strip 1  . amitriptyline (ELAVIL) 10 MG tablet Take by mouth.    [name] Kitchen atenolol (TENORMIN) 25 MG tablet Take 1 tablet (25 mg total) by mouth daily. 90 tablet 1  . cyclobenzaprine (FLEXERIL) 5 MG tablet Take 1-2 tablets (5-10 mg total) by mouth 3 (three) times daily as needed for  muscle spasms. 30 tablet 1  . esomeprazole (NEXIUM) 40 MG capsule Take by mouth.    . hydrochlorothiazide (HYDRODIURIL) 25 MG tablet Take 1 tablet (25 mg total) by mouth daily. 90 tablet 1  . meloxicam (MOBIC) 15 MG tablet Take 1 tablet (15 mg total) by mouth daily. 14 tablet 0  . metFORMIN (GLUCOPHAGE) 1000 MG tablet TAKE 1 TABLET (1,000 MG TOTAL) BY MOUTH 2 (TWO) TIMES DAILY WITH A MEAL. 180 tablet 1  . saxagliptin HCl (ONGLYZA) 2.5 MG TABS tablet Take 1 tablet (2.5 mg total) by mouth daily. 90 tablet 1   No current facility-administered medications for this visit.    No Known Allergies   Exam:  BP (!) 158/77   Pulse 89   Resp 16   Wt 179 lb (81.2 kg)   BMI 32.22 kg/m  General: Well Developed, well nourished, and in no acute distress.  Neuro/Psych: Alert and oriented x3, extra-ocular muscles intact, able to move all 4 extremities, sensation grossly intact. Skin: Warm and dry, no rashes noted.  Respiratory: Not using accessory muscles, speaking in full sentences, trachea midline.  Cardiovascular: Pulses palpable, no extremity edema. Abdomen: Does not appear distended. MSK:  Spine: Nontender to spinal midline.   Tender to palpation bilateral cervical paraspinal muscle group normal neck motion. Normal neck motion.  Negative Spurling's test. Extremity strength is intact throughout  Thoracic spine tender to  palpation along the lower spinal midline.  Tender palpation left thoracic paraspinal muscle group.  Lumbar spine nontender spinal midline tender palpation left lumbar paraspinal muscle group normal back motion lower extremity strength is intact normal gait  X-ray thoracic spine reveals no obvious vertebral compression fracture at the lower portion of the thoracic spine.  Diffuse DDD is present.  Osteopenia is present. Awaiting formal radiology review   No results found for this or any previous visit (from the past 48 hour(s)). No results found.    Assessment and Plan: 61  y.o. female with myofascial strain and spasm of the lumbar thoracic and cervical spine.  Doubtful for vertebral compression fracture based on x-ray appearance today.  Plan for referral to physical therapy as well as cyclobenzaprine and meloxicam.  Recheck in about 4 weeks.    Orders Placed This Encounter  Procedures  . DG Thoracic Spine W/Swimmers    Standing Status:   Future    Number of Occurrences:   1    Standing Expiration Date:   05/19/2018    Order Specific Question:   Reason for Exam (SYMPTOM  OR DIAGNOSIS REQUIRED)    Answer:   eval poss compression fractre lower tspine    Order Specific Question:   Is patient pregnant?    Answer:   No    Order Specific Question:   Preferred imaging location?    Answer:   [facility]    Order Specific Question:   Radiology Contrast Protocol - do NOT remove file path    Answer:   [email or web address]  . Ambulatory referral to Physical Therapy    Referral Priority:   Routine    Referral Type:   Physical Medicine    Referral Reason:   Specialty Services Required    Requested Specialty:   Physical Therapy   Meds ordered this encounter  Medications  . cyclobenzaprine (FLEXERIL) 5 MG tablet    Sig: Take 1-2 tablets (5-10 mg total) by mouth 3 (three) times daily as needed for muscle spasms.    Dispense:  30 tablet    Refill:  1  . meloxicam (MOBIC) 15 MG tablet    Sig: Take 1 tablet (15 mg total) by mouth daily.    Dispense:  14 tablet    Refill:  0    Discussed warning signs or symptoms. Please see discharge instructions. Patient expresses understanding.

## 2017-03-24 ENCOUNTER — Telehealth: Payer: Self-pay

## 2017-03-24 NOTE — Telephone Encounter (Signed)
Lizbett called and states the cyclobenzaprine and the mobic is not helping the back pain. She has tried the heating pad and TENS is also not helping. Please advise.

## 2017-03-25 MED ORDER — TRAMADOL HCL 50 MG PO TABS: 50.0000 mg | ORAL_TABLET | Freq: Four times a day (QID) | ORAL | 0 refills | Status: DC | PRN

## 2017-03-25 NOTE — Telephone Encounter (Signed)
Patient advised.

## 2017-03-25 NOTE — Telephone Encounter (Signed)
Attend PT.  Tramadol sent to [facility] [address] [place]

## 2017-03-28 ENCOUNTER — Telehealth: Payer: Self-pay

## 2017-03-28 NOTE — Telephone Encounter (Signed)
Diane Brooks called and left a message stating the medications is helping with her back pain.

## 2017-03-28 NOTE — Telephone Encounter (Signed)
Patient advised of recommendations.  

## 2017-03-28 NOTE — Telephone Encounter (Signed)
Ok continue to go to PT and other treatment plan by Dr. Georgina Snell.

## 2017-04-19 ENCOUNTER — Ambulatory Visit: Payer: BLUE CROSS/BLUE SHIELD | Admitting: Physician Assistant

## 2017-04-19 ENCOUNTER — Encounter: Payer: Self-pay | Admitting: Physician Assistant

## 2017-04-19 VITALS — BP 130/71 | HR 73 | Wt 178.0 lb

## 2017-04-19 DIAGNOSIS — R413 Other amnesia: Secondary | ICD-10-CM | POA: Diagnosis not present

## 2017-04-19 DIAGNOSIS — E119 Type 2 diabetes mellitus without complications: Secondary | ICD-10-CM

## 2017-04-19 DIAGNOSIS — I10 Essential (primary) hypertension: Secondary | ICD-10-CM | POA: Diagnosis not present

## 2017-04-19 MED ORDER — AMBULATORY NON FORMULARY MEDICATION: 0 refills | Status: DC

## 2017-04-19 NOTE — Progress Notes (Signed)
Subjective:    Patient ID: Diane Brooks, female    DOB: 1955/06/06, 61 y.o.   MRN: [id number]  HPI  Pt is a 62 yo female with Type II diabetes and HTN who presents to the clinic to discussed elevated blood pressure reading before a dental procedure.  Patient had a dental procedure for periodontal surgery but her blood pressure read 165/93 and 163/92.  The provider would not do the surgery.  She had gone to [place] to have procedure done because it was cheaper there.  She comes in to address her blood pressure and to see if anything else needs to be done for treatment.  She denies any chest pains, palpitations, headaches or vision changes.  She also does mention some memory changes.  She is having problems remembering where she put things and some word finding difficulty while she is talking with people.  She denies feeling depressed.  She often does feel anxious.  She has a small dog which helps her relax.  She denies any headache or dizziness.  She recently had her eyes examined and they had no abnormality.  She has had no medication changes recently.  .. Active Ambulatory Problems    Diagnosis Date Noted  . Hyperlipidemia 03/28/2013  . Hiatal hernia 03/28/2013  . Essential hypertension, benign 03/28/2013  . Diabetes mellitus type II, controlled ([place]) 03/28/2013  . GERD (gastroesophageal reflux disease) 05/10/2013  . Globus sensation 05/10/2013  . Atypical chest pain 05/10/2013  . Internal hemorrhoids 05/10/2013  . Decreased libido 05/10/2013  . High risk HPV infection 05/24/2013  . Colon cancer screening 05/25/2013  . Obesity (BMI 30-39.9) 06/27/2013  . Diabetes mellitus ([place]) 06/27/2013  . Abnormal weight gain 06/27/2013  . Hyperlipemia 06/27/2013  . Cyst, eyelid sebaceous 06/27/2013  . Onychomycosis 06/27/2013  . 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 Sudoriferous cyst 07/17/2013  . Seasonal allergies 08/03/2013  . Allergic rhinitis 08/03/2013  . Tinea versicolor 08/03/2013  .  Obesity, unspecified 10/29/2013  . Hot flashes 10/29/2013  . Abdominal pain, epigastric 11/09/2013  . Migraine headache without aura 10/16/2015  . Mid back pain 10/24/2015  . Left-sided low back pain with left-sided sciatica 10/24/2015  . Neck pain 10/24/2015  . De Quervain's disease (radial styloid tenosynovitis) 11/14/2015  . BPV (benign positional vertigo) 11/14/2015  . Muscle spasm of back 11/14/2015  . Myalgia and myositis 11/19/2015  . Mouth sores 12/15/2015  . Black stools 12/15/2015  . Light stools 03/03/2016  . Systolic murmur 91/63/8466   Resolved Ambulatory Problems    Diagnosis Date Noted  . No Resolved Ambulatory Problems   Past Medical History:  Diagnosis Date  . Diabetes mellitus without complication ([place])   . Hyperlipidemia   . Hypertension       Review of Systems  All other systems reviewed and are negative.      Objective:   Physical Exam  Constitutional: She is oriented to person, place, and time. She appears well-developed and well-nourished.  HENT:  Head: Normocephalic and atraumatic.  Neck: Normal range of motion. Neck supple.  Cardiovascular: Normal rate and regular rhythm.  SEM 2/6  Pulmonary/Chest: Effort normal and breath sounds normal.  Neurological: She is alert and oriented to person, place, and time.  Psychiatric: She has a normal mood and affect. Her behavior is normal.          Assessment & Plan:  [name] KitchenMarland KitchenLaresha was seen today for other.  Diagnoses and all orders for this visit:  Essential hypertension, benign  Memory changes -     RPR -     Vitamin B12 -     Sedimentation rate -     CBC -     TSH -     Folate -     HIV antibody (with reflex)  Controlled type 2 diabetes mellitus without complication, without long-term current use of insulin (HCC)  Other orders -     AMBULATORY NON FORMULARY MEDICATION; Blood pressure cuff and supplies.  .. 6CIT Screen 04/19/2017  What Year? 0 points  What month? 0 points  What time?  0 points  Count back from 20 0 points  Months in reverse 2 points  Repeat phrase 6 points  Total Score 8    BP looks great today. Will fill out paperwork that patient BP is controlled here in office and no medication adjustments need to be made. Get home BP cuff and keep BP log. Follow up in 4 weeks.   .. Lab Results  Component Value Date   HGBA1C 6.4 02/14/2017   Not due for a1c yet. Controlled in November.   She did score in abnormal range for memory. Will get labs and repeat in 4 weeks.

## 2017-04-20 ENCOUNTER — Encounter: Payer: Self-pay | Admitting: Physician Assistant

## 2017-04-20 DIAGNOSIS — E538 Deficiency of other specified B group vitamins: Secondary | ICD-10-CM | POA: Insufficient documentation

## 2017-04-20 LAB — SEDIMENTATION RATE: Sed Rate: 14 mm/h (ref 0–30)

## 2017-04-20 LAB — FOLATE: FOLATE: 13.2 ng/mL

## 2017-04-20 LAB — TSH: TSH: 0.99 mIU/L (ref 0.40–4.50)

## 2017-04-20 LAB — HIV ANTIBODY (ROUTINE TESTING W REFLEX): HIV: NONREACTIVE

## 2017-04-20 LAB — CBC
HEMATOCRIT: 38.4 % (ref 35.0–45.0)
HEMOGLOBIN: 13.3 g/dL (ref 11.7–15.5)
MCH: 30 pg (ref 27.0–33.0)
MCHC: 34.6 g/dL (ref 32.0–36.0)
MCV: 86.7 fL (ref 80.0–100.0)
MPV: 10.1 fL (ref 7.5–12.5)
Platelets: 270 10*3/uL (ref 140–400)
RBC: 4.43 10*6/uL (ref 3.80–5.10)
RDW: 13.4 % (ref 11.0–15.0)
WBC: 8 10*3/uL (ref 3.8–10.8)

## 2017-04-20 LAB — VITAMIN B12: Vitamin B-12: 284 pg/mL (ref 200–1100)

## 2017-04-20 LAB — RPR: RPR Ser Ql: NONREACTIVE

## 2017-04-20 NOTE — Progress Notes (Signed)
Call pt: memory loss panel has returned. The only lab that was a low was b12. I would start 1023mcg daily sublingual b12 and recheck labs in 3 months.

## 2017-04-21 ENCOUNTER — Ambulatory Visit: Payer: BLUE CROSS/BLUE SHIELD | Admitting: Family Medicine

## 2017-04-24 ENCOUNTER — Encounter: Payer: Self-pay | Admitting: Physician Assistant

## 2017-04-24 DIAGNOSIS — R413 Other amnesia: Secondary | ICD-10-CM | POA: Insufficient documentation

## 2017-04-25 ENCOUNTER — Other Ambulatory Visit: Payer: Self-pay | Admitting: Physician Assistant

## 2017-06-13 ENCOUNTER — Telehealth: Payer: Self-pay | Admitting: Physician Assistant

## 2017-06-13 ENCOUNTER — Encounter: Payer: Self-pay | Admitting: Physician Assistant

## 2017-06-13 ENCOUNTER — Ambulatory Visit: Payer: BLUE CROSS/BLUE SHIELD | Admitting: Physician Assistant

## 2017-06-13 VITALS — BP 143/78 | HR 82 | Ht 62.5 in | Wt 179.0 lb

## 2017-06-13 DIAGNOSIS — L299 Pruritus, unspecified: Secondary | ICD-10-CM | POA: Diagnosis not present

## 2017-06-13 DIAGNOSIS — I10 Essential (primary) hypertension: Secondary | ICD-10-CM | POA: Diagnosis not present

## 2017-06-13 DIAGNOSIS — D1724 Benign lipomatous neoplasm of skin and subcutaneous tissue of left leg: Secondary | ICD-10-CM

## 2017-06-13 DIAGNOSIS — F419 Anxiety disorder, unspecified: Secondary | ICD-10-CM | POA: Insufficient documentation

## 2017-06-13 DIAGNOSIS — E119 Type 2 diabetes mellitus without complications: Secondary | ICD-10-CM

## 2017-06-13 LAB — POCT GLYCOSYLATED HEMOGLOBIN (HGB A1C): Hemoglobin A1C: 6

## 2017-06-13 MED ORDER — ESCITALOPRAM OXALATE 10 MG PO TABS: 10.0000 mg | ORAL_TABLET | Freq: Every day | ORAL | 1 refills | Status: DC

## 2017-06-13 MED ORDER — TRIAMCINOLONE ACETONIDE 0.1 % EX CREA: 1.0000 "application " | TOPICAL_CREAM | Freq: Two times a day (BID) | CUTANEOUS | 2 refills | Status: DC

## 2017-06-13 NOTE — Patient Instructions (Addendum)
Use aveeno with triamcinolone for ankle itching.  Start lexapro 10mg  daily.   Vivir con ansiedad Living With Anxiety Despus de haber sido diagnosticado con trastorno de ansiedad, podra sentirse aliviado por comprender por qu se haba sentido o haba actuado de cierto modo. Adems, es natural sentirse abrumado por el tratamiento que tiene por delante y por lo que este significar para su vida. Con atencin y [place], [place] trastorno y Marine scientist. Cmo hacer frente a la ansiedad Enfrentar el estrs El estrs es la reaccin del cuerpo ante los cambios y los acontecimientos de la vida, tanto buenos [place]. El estrs puede durar solo algunas horas o puede ser [place]. El estrs puede influir mucho en la ansiedad, por lo que es importante aprender sobre cmo hacerle frente y cmo pensarlo de un modo nuevo. Hable con el mdico o un orientador psicolgico para obtener ms informacin sobre cmo Software engineer. Podran sugerirle algunas tcnicas para hacerlo, como:  Musicoterapia. Esto podra incluir crear o escuchar msica que disfrute y lo inspire.  Meditacin consciente. Esto implica prestar atencin a la respiracin normal ms que intentar controlarla. Puede realizarse mientras est sentado o camina.  Oracin centrante. Este es un tipo de meditacin que implica centrarse en una palabra, frase o imagen sagrada que le sea representativa y le genere paz.  Respiracin profunda. Para hacer esto, expanda el estmago e inhale lentamente por la nariz. Mantenga el aire durante un lapso de [place]. Luego, exhale lentamente mientras deja que los msculos del estmago se relajen.  Dilogo interno. Se trata de una habilidad por la que usted es capaz de identificar patrones de pensamiento que lo llevan a Best boy reacciones ansiosas y de corregir dichos pensamientos.  Relajacin muscular. Esto implica tensar los msculos y, [place], [place].  Elija una tcnica para reducir el  estrs que se adapte a su estilo de vida y su personalidad. Las tcnicas para reducir el estrs llevan tiempo y Location manager. Resrvese de [id number] por da para Ambulance person. Algunos terapeutas pueden ofrecerle capacitacin para aprenderlas. Es posible que algunos planes de seguro mdico cubran la capacitacin. Otras cosas que puede hacer para manejar el estrs:  Catering manager un registro del estrs. Esto puede ayudarlo a Financial planner estrs y modos de Chief Technology Officer su reaccin.  Pensar en cmo reacciona ante ciertas situaciones. Es posible que no sea capaz de Chief Technology Officer todo, pero puede controlar su reaccin.  Hacerse tiempo para las actividades que lo ayudan a Nurse, children's y no sentir culpa por pasar su tiempo de [place].  La terapia en combinacin con las habilidades para enfrentar y reducir el estrs proporciona la mejor alternativa para un tratamiento satisfactorio. Medicamentos Los medicamentos pueden ayudar a [employer]. Algunos medicamentos para la ansiedad:  Teacher, adult education ansiedad.  Antidepresivos.  Betabloqueantes.  Es posible que se requieran medicamentos, junto con la terapia, si otros tratamientos no dieron [place]. Un mdico debe recetar los medicamentos. [place] interpersonales pueden ser muy importantes para ayudar a su recuperacin. Intente pasar ms tiempo interactuando con amigos y familiares de [place]. Considere la posibilidad de ir a terapia de pareja, tomar clases de educacin familiar o ir a Careers information officer. La terapia puede ayudarlos a usted y a los dems a comprender mejor el trastorno. Cmo reconocer cambios en el trastorno Todos tienen una respuesta diferente al tratamiento de la ansiedad. Se dice que est recuperado de la ansiedad cuando los sntomas disminuyen y dejan de interferir en las actividades diarias en  el hogar o el trabajo. Esto podra significar que usted comenzar a Field seismologist lo siguiente:  Biochemist, clinical y atencin.  Dormir mejor.  Estar menos irritable.  Tener ms energa.  Tener [employer].  Es Glass blower/designer cundo el trastorno [place]. Comunquese con el mdico si sus sntomas interfieren en su hogar o su trabajo, y usted no siente que el trastorno est mejorando. Dnde encontrar ayuda y 80: Puede conseguir ayuda y [employer] siguientes lugares:  Grupos de [place].  Organizaciones comunitarias y en lnea.  Un lder espiritual de confianza.  Terapia de pareja.  Clases de educacin familiar.  Terapia familiar.  Siga estas instrucciones en su casa:  Consuma una dieta saludable que incluya abundantes frutas, verduras, cereales integrales, productos lcteos descremados y protenas magras. No consuma muchos alimentos con alto contenido de grasas slidas, azcares agregados o sal.  Actividad fsica. [employer] de los adultos debe hacer lo siguiente: ? Optometrist, al [place], 154minutos de actividad fsica por semana. El ejercicio debe aumentar la frecuencia cardaca y Nature conservation officer transpirar (ejercicio de intensidad moderada). ? Realizar ejercicios de fortalecimiento por lo [employer] por semana.  Disminuir el consumo de cafena, tabaco, alcohol y otras sustancias potencialmente dainas.  Dormir el tiempo adecuado y de Cabin crew. [employer] de los adultos necesitan entre 7y9horas de sueo todas las noches.  Opte por cosas que le simplifiquen la vida.  Tome los medicamentos de venta libre y los recetados solamente como se lo haya indicado el mdico.  Evite el consumo de cafena, alcohol y ciertos medicamentos contra el resfro de venta sin receta. Estos podran Engineer, building services. Pregntele al farmacutico qu medicamentos no debera tomar.  Concurra a todas las visitas de control como se lo haya indicado el mdico. Esto es importante. Preguntas para hacerle al mdico  Me ser til la terapia?  Con qu frecuencia debo visitar a un  mdico para el seguimiento?  Durante cunto tiempo tendr [employer]?  Tienen efectos secundarios a [employer] tomo?  Existe una alternativa que remplace los medicamentos? Comunquese con un mdico si:  Le resulta difcil permanecer concentrado o finalizar las tareas diarias.  Pasa muchas horas por da sintindose preocupado por la vida cotidiana.  La preocupacin le provoca un cansancio extremo.  Comienza a tener dolores de [place] o nuseas, o a sentirse tenso.  Orina ms de lo normal.  Tiene diarrea. Solicite ayuda de inmediato si:  Se le acelera la frecuencia cardaca y Industrial/product designer.  Tiene pensamientos acerca de Glass blower/designer o daar a Producer, television/film/video. Si alguna vez siente que puede lastimarse o Market researcher a los dems, o tiene pensamientos de poner fin a su vida, busque ayuda de inmediato. Puede dirigirse al servicio de urgencias ms cercano o comunicarse con:  El servicio de Multimedia programmer de su localidad (911 en los [place]).  Una lnea de asistencia al suicida y Freight forwarder en crisis, como la [employer] de Prevencin del Suicidio (National Suicide Prevention Lifeline) al (575)758-3527. Est disponible las 24 horas del da.  Resumen  Tomar medidas para enfrentar el estrs puede calmarlo.  Los medicamentos no pueden curar los trastornos de [place], Armed forces training and education officer pueden ayudar a [employer].  Los familiares, los amigos y las parejas pueden tener un lugar importante en su recuperacin del trastorno de ansiedad. Esta informacin no tiene Marine scientist el consejo del mdico. Asegrese de hacerle al mdico cualquier pregunta que tenga. Document Released: 06/15/2016 Document Revised: 06/15/2016  Document Reviewed: 06/15/2016 Elsevier Interactive Patient Education  [employer].

## 2017-06-13 NOTE — Telephone Encounter (Signed)
Need copy of eye exam from my eye doctor in [place].

## 2017-06-13 NOTE — Progress Notes (Signed)
Subjective:    Patient ID: Diane Brooks, female    DOB: 19-Jul-1955, 61 y.o.   MRN: [id number]  HPI Siarra is a 62 year old female with Type II DM, HTN who presented today for a medication refill/check. She states that things have been going great. She is has been dieting, with exercise. She states that she has dropped down a couple pant sizes and she is feeling much better.  DM- she does not check her sugars. Denies any hypoglycemia, no open sores or wounds. Taking medications daily.   HTN- taking medications daily. No problems. She does want her home cuff compared to office BP readings. She has been getting high readings at home.    She does admit to some anxiety. She states that she will randomly feel extremely anxious and will start to hyperventilate and has started to pick at the skin on her thumb. She does not know any specific triggers.   She also complains of some itching and redness to the anterior aspect of her ankles for the past couple weeks. She states that she has not changed any detergents, lotions, etc. Symptoms are worse at night and resolve on their own.   She questions a lump on the back of her left leg. Not painful, red, warm. She has not done anything to make better and nothing seems to make worse.  .. Active Ambulatory Problems    Diagnosis Date Noted  . Hyperlipidemia 03/28/2013  . Hiatal hernia 03/28/2013  . Essential hypertension, benign 03/28/2013  . Diabetes mellitus type II, controlled ([place]) 03/28/2013  . GERD (gastroesophageal reflux disease) 05/10/2013  . Globus sensation 05/10/2013  . Atypical chest pain 05/10/2013  . Internal hemorrhoids 05/10/2013  . Decreased libido 05/10/2013  . High risk HPV infection 05/24/2013  . Colon cancer screening 05/25/2013  . Obesity (BMI 30-39.9) 06/27/2013  . Diabetes mellitus ([place]) 06/27/2013  . Abnormal weight gain 06/27/2013  . Hyperlipemia 06/27/2013  . Cyst, eyelid sebaceous 06/27/2013  . Onychomycosis  06/27/2013  . 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 Sudoriferous cyst 07/17/2013  . Seasonal allergies 08/03/2013  . Allergic rhinitis 08/03/2013  . Tinea versicolor 08/03/2013  . Obesity, unspecified 10/29/2013  . Hot flashes 10/29/2013  . Abdominal pain, epigastric 11/09/2013  . Migraine headache without aura 10/16/2015  . Mid back pain 10/24/2015  . Left-sided low back pain with left-sided sciatica 10/24/2015  . Neck pain 10/24/2015  . De Quervain's disease (radial styloid tenosynovitis) 11/14/2015  . BPV (benign positional vertigo) 11/14/2015  . Muscle spasm of back 11/14/2015  . Myalgia and myositis 11/19/2015  . Mouth sores 12/15/2015  . Black stools 12/15/2015  . Light stools 03/03/2016  . Systolic murmur 07/62/2633  . Low serum vitamin B12 04/20/2017  . Memory changes 04/24/2017   Resolved Ambulatory Problems    Diagnosis Date Noted  . No Resolved Ambulatory Problems   Past Medical History:  Diagnosis Date  . Diabetes mellitus without complication ([place])   . Hyperlipidemia   . Hypertension       Review of Systems  Constitutional: Negative for activity change, appetite change, diaphoresis and fever.  Eyes: Negative for photophobia and visual disturbance.  Respiratory: Negative for shortness of breath.   Cardiovascular: Negative for chest pain and palpitations.  Gastrointestinal: Negative for abdominal pain, nausea and vomiting.  Endocrine: Negative for polydipsia, polyphagia and polyuria.  Neurological: Negative for dizziness, syncope, weakness and headaches.  Psychiatric/Behavioral: Negative for behavioral problems. The patient is nervous/anxious.  Objective:   Physical Exam  Constitutional: She is oriented to person, place, and time. She appears well-developed and well-nourished.  HENT:  Head: Normocephalic and atraumatic.  Eyes: Conjunctivae are normal.  Cardiovascular: Normal rate, regular rhythm and normal heart sounds.  Pulmonary/Chest: Effort  normal and breath sounds normal. No respiratory distress.  Neurological: She is alert and oriented to person, place, and time.  Skin: Skin is warm and dry.  No rash around ankles.   Bilateral varicose veins. There was some lumpy areas around upper thighs of left leg. No pain, warmth, redness, swelling.   Psychiatric: She has a normal mood and affect. Her behavior is normal.  Nursing note and vitals reviewed.         Assessment & Plan:  [name] KitchenMarland KitchenMaia was seen today for diabetes.  Diagnoses and all orders for this visit:  Controlled type 2 diabetes mellitus without complication, without long-term current use of insulin (HCC) -     POCT HgB A1C  Itching -     triamcinolone cream (KENALOG) 0.1 %; Apply 1 application topically 2 (two) times daily.  Anxiety -     escitalopram (LEXAPRO) 10 MG tablet; Take 1 tablet (10 mg total) by mouth daily.  Essential hypertension, benign   .[name] Kitchen Results for orders placed or performed in visit on 06/13/17  POCT HgB A1C  Result Value Ref Range   Hemoglobin A1C 6.0    A!C improved and looks great.  Continue with medication and exercise with healthy diet.  .. Diabetic Foot Exam - Simple   Simple Foot Form Visual Inspection No deformities, no ulcerations, no other skin breakdown bilaterally:  Yes Sensation Testing Intact to touch and monofilament testing bilaterally:  Yes Pulse Check Posterior Tibialis and Dorsalis pulse intact bilaterally:  Yes Comments    BP controlled.  Not on statin. Last LDL 76. Needs to have repeated.  Will call and get eye exam.   Reassurance that lump on leg is lipoma. No intervention needed.   Unclear etiology of itching. Given triamcinolone to combine with aveeno. Look for triggers. Wonder if there could be some correlation with anxiety.   For anxiety started lexapro. Discussed side effects. Follow up in 4-6 weeks. Discussed counseling for coping mechanisms. HO given.   [name] Kitchen.Spent 40 minutes with patient and  greater than 50 percent of visit spent counseling patient regarding treatment plan.

## 2017-06-13 NOTE — Progress Notes (Deleted)
Eye doctor Jule Ser.  Lipoma leg Itching leg

## 2017-06-14 NOTE — Telephone Encounter (Signed)
Fax request placed with front office.

## 2017-06-30 ENCOUNTER — Emergency Department (INDEPENDENT_AMBULATORY_CARE_PROVIDER_SITE_OTHER): Payer: BLUE CROSS/BLUE SHIELD

## 2017-06-30 ENCOUNTER — Other Ambulatory Visit: Payer: Self-pay

## 2017-06-30 ENCOUNTER — Emergency Department
Admission: EM | Admit: 2017-06-30 | Discharge: 2017-06-30 | Disposition: A | Discharge status: Home / Self Care | Payer: BLUE CROSS/BLUE SHIELD | Source: Home / Self Care | Attending: Emergency Medicine | Admitting: Emergency Medicine

## 2017-06-30 DIAGNOSIS — R05 Cough: Secondary | ICD-10-CM

## 2017-06-30 DIAGNOSIS — R062 Wheezing: Secondary | ICD-10-CM | POA: Diagnosis not present

## 2017-06-30 DIAGNOSIS — R0989 Other specified symptoms and signs involving the circulatory and respiratory systems: Secondary | ICD-10-CM

## 2017-06-30 DIAGNOSIS — K219 Gastro-esophageal reflux disease without esophagitis: Secondary | ICD-10-CM

## 2017-06-30 DIAGNOSIS — R059 Cough, unspecified: Secondary | ICD-10-CM

## 2017-06-30 MED ORDER — BENZONATATE 100 MG PO CAPS: 100.0000 mg | ORAL_CAPSULE | Freq: Three times a day (TID) | ORAL | 0 refills | Status: DC | PRN

## 2017-06-30 MED ORDER — PREDNISONE 50 MG PO TABS: ORAL_TABLET | ORAL | 0 refills | Status: DC

## 2017-06-30 MED ORDER — ALBUTEROL SULFATE HFA 108 (90 BASE) MCG/ACT IN AERS: 1.0000 | INHALATION_SPRAY | RESPIRATORY_TRACT | 2 refills | Status: DC | PRN

## 2017-06-30 MED ORDER — IPRATROPIUM-ALBUTEROL 0.5-2.5 (3) MG/3ML IN SOLN
3.0000 mL | Freq: Once | RESPIRATORY_TRACT | Status: AC
  Administered 2017-06-30: 3 mL via RESPIRATORY_TRACT

## 2017-06-30 MED ORDER — METHYLPREDNISOLONE SODIUM SUCC 125 MG IJ SOLR
125.0000 mg | Freq: Once | INTRAMUSCULAR | Status: AC
  Administered 2017-06-30: 125 mg via INTRAMUSCULAR

## 2017-06-30 MED ORDER — ALBUTEROL SULFATE HFA 108 (90 BASE) MCG/ACT IN AERS: INHALATION_SPRAY | RESPIRATORY_TRACT | 1 refills | Status: DC

## 2017-06-30 NOTE — ED Provider Notes (Addendum)
Diane Brooks CARE    CSN: [id number] Arrival date & time: 06/30/17  1440     History   Chief Complaint Chief Complaint  Patient presents with  . Cough  . Nasal Congestion   Patient states she has been ill for 8 or 9 days.  She had a dry nonproductive cough which started prior to a recent trip to the [place]  She states that she was sick before she left.  She has a dry cough.  She does not have any phlegm.  She has minimal nasal congestion.  She does hear wheezing in her chest.  She was seen with similar symptoms in November of last year.  At that time she was treated with prednisone and albuterol.  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  She is currently on reflux medications. HPI Diane Brooks is a 62 y.o. female.   HPI  Past Medical History:  Diagnosis Date  . Diabetes mellitus without complication ([place])   . Hyperlipidemia   . Hypertension     Patient Active Problem List   Diagnosis Date Noted  . Itching 06/13/2017  . Anxiety 06/13/2017  . Lipoma of left lower extremity 06/13/2017  . Memory changes 04/24/2017  . Low serum vitamin B12 04/20/2017  . Systolic murmur 64/40/3474  . Light stools 03/03/2016  . Mouth sores 12/15/2015  . Black stools 12/15/2015  . Myalgia and myositis 11/19/2015  . De Quervain's disease (radial styloid tenosynovitis) 11/14/2015  . BPV (benign positional vertigo) 11/14/2015  . Muscle spasm of back 11/14/2015  . Mid back pain 10/24/2015  . Left-sided low back pain with left-sided sciatica 10/24/2015  . Neck pain 10/24/2015  . Migraine headache without aura 10/16/2015  . Abdominal pain, epigastric 11/09/2013  . Obesity, unspecified 10/29/2013  . Hot flashes 10/29/2013  . Seasonal allergies 08/03/2013  . Allergic rhinitis 08/03/2013  . Tinea versicolor 08/03/2013  . Sudoriferous cyst 07/17/2013  . 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 Obesity (BMI  30-39.9) 06/27/2013  . Diabetes mellitus ([place]) 06/27/2013  . Abnormal weight gain 06/27/2013  . Hyperlipemia 06/27/2013  . Cyst, eyelid sebaceous 06/27/2013  . Onychomycosis 06/27/2013  . Colon cancer screening 05/25/2013  . High risk HPV infection 05/24/2013  . GERD (gastroesophageal reflux disease) 05/10/2013  . Globus sensation 05/10/2013  . Atypical chest pain 05/10/2013  . Internal hemorrhoids 05/10/2013  . Decreased libido 05/10/2013  . Hyperlipidemia 03/28/2013  . Hiatal hernia 03/28/2013  . Essential hypertension, benign 03/28/2013  . Diabetes mellitus type II, controlled ([place]) 03/28/2013    Past Surgical History:  Procedure Laterality Date  . BREAST ENHANCEMENT SURGERY    . ESOPHAGEAL MANOMETRY N/A 07/02/2013   Procedure: ESOPHAGEAL MANOMETRY (EM);  Surgeon: Jerene Bears, MD;  Location: [facility];  Service: Gastroenterology;  Laterality: N/A;  . TONSILECTOMY/ADENOIDECTOMY WITH MYRINGOTOMY    . TUBAL LIGATION    . tummy tuck      OB History    Gravida  3   Para  2   Term      Preterm      AB  1   Living  2     SAB  1   TAB      Ectopic      Multiple      Live Births               Home Medications    Prior to Admission medications  Medication Sig Start Date End Date Taking? Authorizing Provider  albuterol (PROVENTIL HFA;VENTOLIN HFA) 108 (90 Base) MCG/ACT inhaler Use 2 puffs every 4-6 hours as needed for wheezing 06/30/17   Darlyne Russian, MD  AMBULATORY NON FORMULARY MEDICATION Freestyle Glucometer   Dx: 250.00 DM, type II 12/26/13   Breeback, [name] L, PA-C  AMBULATORY NON FORMULARY MEDICATION Freestyle test strips.   DX Type II diabetes 08/06/15   Breeback, [name] L, PA-C  AMBULATORY NON FORMULARY MEDICATION Blood pressure cuff and supplies. 04/19/17   Donella Stade, PA-C  amitriptyline (ELAVIL) 10 MG tablet Take by mouth. 11/10/16   [provider]  atenolol (TENORMIN) 25 MG tablet Take 1 tablet (25 mg total) by mouth daily.  03/03/16   Breeback, [name] L, PA-C  benzonatate (TESSALON) 100 MG capsule Take 1-2 capsules (100-200 mg total) by mouth 3 (three) times daily as needed for cough. 06/30/17   Darlyne Russian, MD  cyclobenzaprine (FLEXERIL) 5 MG tablet Take 1-2 tablets (5-10 mg total) by mouth 3 (three) times daily as needed for muscle spasms. 03/18/17   Gregor Hams, MD  escitalopram (LEXAPRO) 10 MG tablet Take 1 tablet (10 mg total) by mouth daily. 06/13/17   Donella Stade, PA-C  esomeprazole (NEXIUM) 40 MG capsule Take by mouth. 12/29/16   [provider]  FREESTYLE LITE test strip USE AS DIRECTED 04/26/17   Breeback, [name] L, PA-C  hydrochlorothiazide (HYDRODIURIL) 25 MG tablet Take 1 tablet (25 mg total) by mouth daily. 06/16/16   Breeback, Royetta Car, PA-C  meloxicam (MOBIC) 15 MG tablet Take 1 tablet (15 mg total) by mouth daily. 03/18/17   Gregor Hams, MD  metFORMIN (GLUCOPHAGE) 1000 MG tablet TAKE 1 TABLET (1,000 MG TOTAL) BY MOUTH 2 (TWO) TIMES DAILY WITH A MEAL. 06/16/16   Donella Stade, PA-C  predniSONE (DELTASONE) 50 MG tablet Starting on Friday 07/01/2017  take 1 tablet a day for 5 days 06/30/17   Darlyne Russian, MD  saxagliptin HCl (ONGLYZA) 2.5 MG TABS tablet Take 1 tablet (2.5 mg total) by mouth daily. 06/16/16   Breeback, Royetta Car, PA-C  traMADol (ULTRAM) 50 MG tablet Take 1 tablet (50 mg total) by mouth every 6 (six) hours as needed. 5 day supply 03/25/17   Gregor Hams, MD  triamcinolone cream (KENALOG) 0.1 % Apply 1 application topically 2 (two) times daily. 06/13/17   Donella Stade, PA-C    Family History Family History  Problem Relation Age of Onset  . Hyperlipidemia Mother   . Diabetes Father   . Hyperlipidemia Father   . Hypertension Father   . Diabetes Maternal Grandfather   . Hyperlipidemia Maternal Grandfather   . Diabetes Paternal Grandfather   . Hyperlipidemia Paternal Grandfather     Social History Social History   Tobacco Use  . Smoking status: Former Smoker    Last  attempt to quit: 03/23/1991    Years since quitting: 26.2  . Smokeless tobacco: Never Used  Substance Use Topics  . Alcohol use: No  . Drug use: No     Allergies   Patient has no known allergies.   Review of Systems Review of Systems  Constitutional: Negative for fever.  HENT: Positive for congestion. Negative for sore throat.   Eyes: Negative.   Respiratory: Positive for cough, chest tightness, shortness of breath and wheezing.   Cardiovascular: Negative.   Gastrointestinal:       She has a history of esophageal problems.  She  is currently on a PPI.  She was a previous smoker but has not smoked since 1993.  Endocrine:       She has a history of diabetes.  She is currently on metformin.  Blood sugar checked this morning was 108.  Genitourinary:       When she has her severe coughing episodes she is incontinent of urine.  Skin: Negative.   Allergic/Immunologic:       She does have a history of allergies this time of year.  Psychiatric/Behavioral: Negative.      Physical Exam Triage Vital Signs ED Triage Vitals [06/30/17 1501]  Enc Vitals Group     BP 132/80     Pulse Rate 67     Resp      Temp 98.4 F (36.9 C)     Temp Source Oral     SpO2 98 %     Weight 175 lb (79.4 kg)     Height 5\' 3"  (1.6 m)     Head Circumference      Peak Flow      Pain Score 0     Pain Loc      Pain Edu?      Excl. in [place]?    No data found.  Updated Vital Signs BP 132/80 (BP Location: Right Arm)   Pulse 67   Temp 98.4 F (36.9 C) (Oral)   Ht 5\' 3"  (1.6 m)   Wt 175 lb (79.4 kg)   SpO2 98%   BMI 31.00 kg/m   Visual Acuity Right Eye Distance:   Left Eye Distance:   Bilateral Distance:    Right Eye Near:   Left Eye Near:    Bilateral Near:     Physical Exam  Constitutional: She appears well-developed and well-nourished.  HENT:  Mouth/Throat: Oropharynx is clear and moist.  Eyes: Pupils are equal, round, and reactive to light.  Neck: Normal range of motion. No thyromegaly  present.  Cardiovascular: Normal rate and regular rhythm.  Pulmonary/Chest:  Breath sounds are symmetrical.  There is an end expiratory wheeze bilaterally.  There are no areas of dullness.  There are no retractions.  Musculoskeletal:  Extremity exam reveals no calf tenderness.  There is no edema noted.     UC Treatments / Results  Labs (all labs ordered are listed, but only abnormal results are displayed) Labs Reviewed - No data to display  EKG None Radiology No results found.  Procedures Procedures (including critical care time)  Medications Ordered in UC Medications  ipratropium-albuterol (DUONEB) 0.5-2.5 (3) MG/3ML nebulizer solution 3 mL (3 mLs Nebulization Given 06/30/17 1512)  methylPREDNISolone sodium succinate (SOLU-MEDROL) 125 mg/2 mL injection 125 mg (125 mg Intramuscular Given 06/30/17 1535)     Initial Impression / Assessment and Plan / UC Course  I have reviewed the triage vital signs and the nursing notes.  Pertinent labs & imaging results that were available during my care of the patient were reviewed by me and considered in my medical decision making (see chart for details).     Patient presents with a dry nonproductive cough associated with wheezing.  She had similar episodes in November.  She was initially treated with a prednisone 50 mg daily dose for 5 days.  She continued to be symptomatic and then was treated with Zithromax along with a Solu-Medrol injection.  She has a history of esophageal spasm and problems and is on treatment for this.  She has not been using an inhaler with  this episode.  I have advised the patient to make an appointment to see Dr. Hilarie Fredrickson to be sure reflux is not the trigger for her wheezing.  Peak flow after the first treatment was 200.  Peak flow after the second treatment was 275.  We will go ahead with a second treatment.  Also of note patient is on Tenormin for blood pressure.  Even though this is a cardioselective beta-blocker it   potentially could be making her wheezing worse and she was advised to discuss this with her PCP.  Peak flow increased to 275 so I felt comfortable letting her go.  Final Clinical Impressions(s) / UC Diagnoses   Final diagnoses:  Cough  Wheezing  Gastroesophageal reflux disease, esophagitis presence not specified   Please monitor your diabetes closely because the prednisone can increase your sugar. Take cough capsules as needed. Follow-up with your primary care provider if symptoms are persistent over the next 48-72 hours.  Be sure you continue on your reflux medication.. Please make an appointment with Dr. Hilarie Fredrickson for reevaluation of your reflux.. If your breathing gets worse during the night please go to the emergency room or call 911. ED Discharge Orders        Ordered    predniSONE (DELTASONE) 50 MG tablet     06/30/17 1525    benzonatate (TESSALON) 100 MG capsule  3 times daily PRN     06/30/17 1525    albuterol (PROVENTIL HFA;VENTOLIN HFA) 108 (90 Base) MCG/ACT inhaler     06/30/17 1525       Controlled Substance Prescriptions [place] Controlled Substance Registry consulted? Not Applicable   Darlyne Russian, MD 06/30/17 2254    Darlyne Russian, MD 06/30/17 2255

## 2017-06-30 NOTE — Discharge Instructions (Addendum)
Use your inhaler 2 puffs every 4-6 hours. Starting tomorrow take prednisone 1 tablet a day for 5 days. Please monitor your diabetes closely because the prednisone can increase your sugar. Take cough capsules as needed. Follow-up with your primary care provider if symptoms are persistent over the next 48-72 hours.  Be sure you continue on your reflux medication.. Please make an appointment with Dr. Hilarie Fredrickson for reevaluation of your reflux.. If your breathing gets worse please present to the emergency room or call 911. You have an appointment tomorrow afternoon with [name] at 11:30

## 2017-06-30 NOTE — ED Triage Notes (Signed)
Started about 1 1/2 weeks ago with chest congestion and cough.  Has been in the DR for the past week.  Just came home today.  Has SOB when talking.  Denies fever.  Non productive cough, that wakes her at night.

## 2017-07-01 ENCOUNTER — Ambulatory Visit: Payer: BLUE CROSS/BLUE SHIELD | Admitting: Physician Assistant

## 2017-07-01 ENCOUNTER — Ambulatory Visit: Payer: BLUE CROSS/BLUE SHIELD | Admitting: Family Medicine

## 2017-07-01 ENCOUNTER — Encounter: Payer: Self-pay | Admitting: Physician Assistant

## 2017-07-01 VITALS — BP 133/63 | HR 82 | Ht 63.0 in | Wt 176.0 lb

## 2017-07-01 DIAGNOSIS — R059 Cough, unspecified: Secondary | ICD-10-CM

## 2017-07-01 DIAGNOSIS — K2100 Gastro-esophageal reflux disease with esophagitis, without bleeding: Secondary | ICD-10-CM

## 2017-07-01 DIAGNOSIS — R05 Cough: Secondary | ICD-10-CM | POA: Diagnosis not present

## 2017-07-01 DIAGNOSIS — J4521 Mild intermittent asthma with (acute) exacerbation: Secondary | ICD-10-CM

## 2017-07-01 NOTE — Patient Instructions (Signed)
Zantac 150mg  twice a day. Staying nexium  Keep on atenolol.  Follow up GI>

## 2017-07-01 NOTE — Progress Notes (Signed)
Subjective:    Patient ID: Diane Brooks, female    DOB: 02-18-1956, 62 y.o.   MRN: [id number]  HPI [place] presents today for a follow up after being seen by [facility] UC for a dry nonproductive cough. She has had the cough for about 10 days at this point. It started before she traveled to the [place] in which she was there for about a week. She had the cough while she was there as well. She denies any fevers, chills, nausea, vomiting but did note some audible wheezing. She had a similar episode to this last November.   She was given Solu-medrol, and an nebulizer treatment at [facility] yesterday and was discharged on Albuterol inhaler PRN, prednisone, and tessalon pearls. She has not taken any prednisone yet or used her inhaler. She states she is feeling some better today.  Today, she states that prior to traveling to the [place] she did a "deep clean" of her house in which she noticed that her symptoms probably started after that. She was using a lot of "chemicals" to clean as well. She also complains of some reflux, she has a history of nutcraker esophagus and is supposed to take a PPI daily, she does not take this daily.  She is also on atenolol at this time but has been on it for several years and has not really reported any problems.   Patient had CXR yesterday at [facility] which showed no active cardiopulmonary disease.    .. Active Ambulatory Problems    Diagnosis Date Noted  . Hyperlipidemia 03/28/2013  . Hiatal hernia 03/28/2013  . Essential hypertension, benign 03/28/2013  . Diabetes mellitus type II, controlled ([place]) 03/28/2013  . GERD (gastroesophageal reflux disease) 05/10/2013  . Globus sensation 05/10/2013  . Atypical chest pain 05/10/2013  . Internal hemorrhoids 05/10/2013  . Decreased libido 05/10/2013  . High risk HPV infection 05/24/2013  . Colon cancer screening 05/25/2013  . Obesity (BMI 30-39.9) 06/27/2013  . Diabetes mellitus ([place]) 06/27/2013  .  Abnormal weight gain 06/27/2013  . Hyperlipemia 06/27/2013  . Cyst, eyelid sebaceous 06/27/2013  . Onychomycosis 06/27/2013  . 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 Sudoriferous cyst 07/17/2013  . Seasonal allergies 08/03/2013  . Allergic rhinitis 08/03/2013  . Tinea versicolor 08/03/2013  . Obesity, unspecified 10/29/2013  . Hot flashes 10/29/2013  . Abdominal pain, epigastric 11/09/2013  . Migraine headache without aura 10/16/2015  . Mid back pain 10/24/2015  . Left-sided low back pain with left-sided sciatica 10/24/2015  . Neck pain 10/24/2015  . De Quervain's disease (radial styloid tenosynovitis) 11/14/2015  . BPV (benign positional vertigo) 11/14/2015  . Muscle spasm of back 11/14/2015  . Myalgia and myositis 11/19/2015  . Mouth sores 12/15/2015  . Black stools 12/15/2015  . Light stools 03/03/2016  . Systolic murmur 00/92/3300  . Low serum vitamin B12 04/20/2017  . Memory changes 04/24/2017  . Itching 06/13/2017  . Anxiety 06/13/2017  . Lipoma of left lower extremity 06/13/2017  . Reactive airway disease 07/03/2017  . Cough 07/03/2017   Resolved Ambulatory Problems    Diagnosis Date Noted  . No Resolved Ambulatory Problems   Past Medical History:  Diagnosis Date  . Diabetes mellitus without complication ([place])   . Hyperlipidemia   . Hypertension      Review of Systems  Constitutional: Negative for activity change, chills, fatigue and fever.  HENT: Negative for congestion, sinus pressure and sinus pain.   Respiratory: Positive for cough. Negative for shortness of breath.  Cardiovascular: Negative for chest pain and leg swelling.  Gastrointestinal: Negative for diarrhea, nausea and vomiting.       Objective:   Physical Exam  Constitutional: She is oriented to person, place, and time. She appears well-developed and well-nourished.  HENT:  Head: Normocephalic and atraumatic.  Right Ear: External ear normal.  Left Ear: External ear normal.  Nose: Nose  normal.  Mouth/Throat: Oropharynx is clear and moist. No oropharyngeal exudate.  Eyes: Conjunctivae are normal. Right eye exhibits no discharge. Left eye exhibits no discharge.  Neck: Normal range of motion. Neck supple.  Cardiovascular: Normal rate, regular rhythm and normal heart sounds.  Pulmonary/Chest: Effort normal and breath sounds normal. She has no wheezes.  Lymphadenopathy:    She has no cervical adenopathy.  Neurological: She is alert and oriented to person, place, and time.  Skin: Skin is dry.  Psychiatric: She has a normal mood and affect. Her behavior is normal.          Assessment & Plan:  [name] KitchenMarland KitchenDiagnoses and all orders for this visit:  Mild intermittent reactive airway disease with acute exacerbation  Cough -     ranitidine (ZANTAC) 150 MG tablet; Take 1 tablet (150 mg total) by mouth 2 (two) times daily.  Gastroesophageal reflux disease with esophagitis -     ranitidine (ZANTAC) 150 MG tablet; Take 1 tablet (150 mg total) by mouth 2 (two) times daily.   Vitals look great today. Pulse ox 100 percent. Pt seems to be improving today. I do not see any signs of infection today. I suspect her cough is more allergic vs reactive airway disease. Finish prednisone. Continue tessalon pearls for cough. Albuterol as needed. Consider zyrtec/claritin daily. Could be some reflux going on as well. Start zantac bid with nexium daily. I do not think atenolol BB is worsening lung function. Pt has been on for years. Will continue to monitor. When she is feeling better consider spirometry. Follow up in 1 month.   GERD seems to be worsening consider follow up with GI.

## 2017-07-03 ENCOUNTER — Encounter: Payer: Self-pay | Admitting: Physician Assistant

## 2017-07-03 DIAGNOSIS — R05 Cough: Secondary | ICD-10-CM | POA: Insufficient documentation

## 2017-07-03 DIAGNOSIS — J45909 Unspecified asthma, uncomplicated: Secondary | ICD-10-CM | POA: Insufficient documentation

## 2017-07-03 DIAGNOSIS — R053 Chronic cough: Secondary | ICD-10-CM | POA: Insufficient documentation

## 2017-07-03 MED ORDER — RANITIDINE HCL 150 MG PO TABS: 150.0000 mg | ORAL_TABLET | Freq: Two times a day (BID) | ORAL | 5 refills | Status: DC

## 2017-07-06 ENCOUNTER — Ambulatory Visit: Payer: BLUE CROSS/BLUE SHIELD | Admitting: Physician Assistant

## 2017-07-07 ENCOUNTER — Encounter: Payer: Self-pay | Admitting: Sports Medicine

## 2017-07-07 ENCOUNTER — Ambulatory Visit (INDEPENDENT_AMBULATORY_CARE_PROVIDER_SITE_OTHER): Payer: BLUE CROSS/BLUE SHIELD

## 2017-07-07 ENCOUNTER — Ambulatory Visit: Payer: BLUE CROSS/BLUE SHIELD | Admitting: Sports Medicine

## 2017-07-07 DIAGNOSIS — R05 Cough: Secondary | ICD-10-CM | POA: Diagnosis not present

## 2017-07-07 DIAGNOSIS — R0989 Other specified symptoms and signs involving the circulatory and respiratory systems: Secondary | ICD-10-CM | POA: Diagnosis not present

## 2017-07-07 DIAGNOSIS — K449 Diaphragmatic hernia without obstruction or gangrene: Secondary | ICD-10-CM | POA: Diagnosis not present

## 2017-07-07 DIAGNOSIS — R0602 Shortness of breath: Secondary | ICD-10-CM

## 2017-07-07 LAB — COMPREHENSIVE METABOLIC PANEL
AG Ratio: 1.7 (calc) (ref 1.0–2.5)
ALT: 20 U/L (ref 6–29)
AST: 15 U/L (ref 10–35)
BUN: 19 mg/dL (ref 7–25)
CO2: 34 mmol/L — ABNORMAL HIGH (ref 20–32)
Calcium: 10.2 mg/dL (ref 8.6–10.4)
Sodium: 138 mmol/L (ref 135–146)
Total Bilirubin: 0.4 mg/dL (ref 0.2–1.2)
Total Protein: 6.7 g/dL (ref 6.1–8.1)

## 2017-07-07 LAB — COMPREHENSIVE METABOLIC PANEL WITH GFR
Albumin: 4.2 g/dL (ref 3.6–5.1)
Alkaline phosphatase (APISO): 51 U/L (ref 33–130)
Chloride: 95 mmol/L — ABNORMAL LOW (ref 98–110)
Creat: 0.76 mg/dL (ref 0.50–1.05)
Globulin: 2.5 g/dL (ref 1.9–3.7)
Glucose, Bld: 126 mg/dL — ABNORMAL HIGH (ref 65–99)
Potassium: 4.4 mmol/L (ref 3.5–5.3)

## 2017-07-07 LAB — CBC
HCT: 40.1 % (ref 35.0–45.0)
Hemoglobin: 13.8 g/dL (ref 11.7–15.5)
MCH: 30.2 pg (ref 27.0–33.0)
MCHC: 34.4 g/dL (ref 32.0–36.0)
MCV: 87.7 fL (ref 80.0–100.0)
MPV: 9.8 fL (ref 7.5–12.5)
Platelets: 337 Thousand/uL (ref 140–400)
RBC: 4.57 Million/uL (ref 3.80–5.10)
RDW: 13.4 % (ref 11.0–15.0)
WBC: 7.7 10*3/uL (ref 3.8–10.8)

## 2017-07-07 LAB — D-DIMER, QUANTITATIVE: D-Dimer, Quant: 0.34 mcg/mL FEU (ref <0.50)

## 2017-07-07 MED ORDER — ESOMEPRAZOLE MAGNESIUM 40 MG PO CPDR
40.0000 mg | DELAYED_RELEASE_CAPSULE | Freq: Two times a day (BID) | ORAL | 3 refills | Status: DC
Start: 2017-07-07 — End: 2017-11-10

## 2017-07-07 MED ORDER — LORATADINE 10 MG PO TABS
10.0000 mg | ORAL_TABLET | Freq: Every day | ORAL | 3 refills | Status: DC
Start: 2017-07-07 — End: 2017-09-13

## 2017-07-07 MED ORDER — RANITIDINE HCL 300 MG PO TABS: 300.0000 mg | ORAL_TABLET | Freq: Two times a day (BID) | ORAL | 3 refills | Status: DC

## 2017-07-07 NOTE — Progress Notes (Signed)
Subjective:    CC: Chest and abdominal pain  HPI: This is a very pleasant 62 year old female with a strong history of gastritis and GERD, for the past several weeks she has had increasing cough, with some resultant chest and back pain, mild shortness of breath and significant runny nose with postnasal drip.  In addition she has had increasing abdominal pain, epigastric, worse with eating, no real dark, tarry, or bloody stools.  She is only taking ranitidine twice a day, no longer taking Nexium.  No fevers or chills, symptoms are moderate, persistent.  I reviewed the past medical history, family history, social history, surgical history, and allergies today and no changes were needed.  Please see the problem list section below in epic for further details.  Past Medical History: Past Medical History:  Diagnosis Date  . Diabetes mellitus without complication ([place])   . Hyperlipidemia   . Hypertension    Past Surgical History: Past Surgical History:  Procedure Laterality Date  . BREAST ENHANCEMENT SURGERY    . ESOPHAGEAL MANOMETRY N/A 07/02/2013   Procedure: ESOPHAGEAL MANOMETRY (EM);  Surgeon: Jerene Bears, MD;  Location: [facility];  Service: Gastroenterology;  Laterality: N/A;  . TONSILECTOMY/ADENOIDECTOMY WITH MYRINGOTOMY    . TUBAL LIGATION    . tummy tuck     Social History: Social History   Socioeconomic History  . Marital status: Married    Spouse name: Not on file  . Number of children: 2  . Years of education: Not on file  . Highest education level: Not on file  Occupational History  . Occupation: Retired  Scientific laboratory technician  . Financial resource strain: Not on file  . Food insecurity:    Worry: Not on file    Inability: Not on file  . Transportation needs:    Medical: Not on file    Non-medical: Not on file  Tobacco Use  . Smoking status: Former Smoker    Last attempt to quit: 03/23/1991    Years since quitting: 26.3  . Smokeless tobacco: Never Used  Substance and  Sexual Activity  . Alcohol use: No  . Drug use: No  . Sexual activity: Yes    Birth control/protection: Surgical, Post-menopausal  Lifestyle  . Physical activity:    Days per week: Not on file    Minutes per session: Not on file  . Stress: Not on file  Relationships  . Social connections:    Talks on phone: Not on file    Gets together: Not on file    Attends religious service: Not on file    Active member of club or organization: Not on file    Attends meetings of clubs or organizations: Not on file    Relationship status: Not on file  Other Topics Concern  . Not on file  Social History Narrative  . Not on file   Family History: Family History  Problem Relation Age of Onset  . Hyperlipidemia Mother   . Diabetes Father   . Hyperlipidemia Father   . Hypertension Father   . Diabetes Maternal Grandfather   . Hyperlipidemia Maternal Grandfather   . Diabetes Paternal Grandfather   . Hyperlipidemia Paternal Grandfather    Allergies: No Known Allergies Medications: See med rec.  Review of Systems: No fevers, chills, night sweats, weight loss, chest pain, or shortness of breath.   Objective:    General: Well Developed, well nourished, and in no acute distress.  Neuro: Alert and oriented x3, extra-ocular muscles intact, sensation  grossly intact.  HEENT: Normocephalic, atraumatic, pupils equal round reactive to light, neck supple, no masses, no lymphadenopathy, thyroid nonpalpable.  Rhinorrhea, boggy and erythematous turbinates, oropharynx and ear canals are unremarkable. Skin: Warm and dry, no rashes. Cardiac: Regular rate and rhythm, no murmurs rubs or gallops, no lower extremity edema.  Respiratory: Clear to auscultation bilaterally. Not using accessory muscles, speaking in full sentences. Abdomen: Soft, tender to palpation in the epigastrium, nondistended, no bowel sounds, no palpable masses, no guarding, rigidity, rebound tenderness.  Impression and Recommendations:     Hiatal hernia Insufficient relief with ranitidine twice a day, restarting esomeprazole twice a day. GI cocktail today. Checking some routine labs, she is complaining of some dark stools. She did have an EGD not too long ago with streaky gastritis. Keep follow-up next week for this.  Shortness of breath With cough, relatively benign exam, likely allergic bronchitis. Adding loratadine. Because she does have some pleuritic chest pain, adding a chest x-ray, d-dimer, and other routine labs. ___________________________________________ Gwen Her. Dianah Field, M.D., ABFM., CAQSM. Primary Care and [place] Instructor of [place] [employer] [facility] [employer]

## 2017-07-07 NOTE — Assessment & Plan Note (Signed)
Insufficient relief with ranitidine twice a day, restarting esomeprazole twice a day. GI cocktail today. Checking some routine labs, she is complaining of some dark stools. She did have an EGD not too long ago with streaky gastritis. Keep follow-up next week for this.

## 2017-07-07 NOTE — Assessment & Plan Note (Signed)
With cough, relatively benign exam, likely allergic bronchitis. Adding loratadine. Because she does have some pleuritic chest pain, adding a chest x-ray, d-dimer, and other routine labs.

## 2017-07-11 ENCOUNTER — Other Ambulatory Visit: Payer: Self-pay | Admitting: Physician Assistant

## 2017-07-11 DIAGNOSIS — E119 Type 2 diabetes mellitus without complications: Secondary | ICD-10-CM

## 2017-07-14 ENCOUNTER — Ambulatory Visit: Payer: BLUE CROSS/BLUE SHIELD | Admitting: Sports Medicine

## 2017-07-14 DIAGNOSIS — R05 Cough: Secondary | ICD-10-CM | POA: Diagnosis not present

## 2017-07-14 DIAGNOSIS — M7918 Myalgia, other site: Secondary | ICD-10-CM | POA: Diagnosis not present

## 2017-07-14 DIAGNOSIS — R059 Cough, unspecified: Secondary | ICD-10-CM

## 2017-07-14 DIAGNOSIS — M2550 Pain in unspecified joint: Secondary | ICD-10-CM

## 2017-07-14 MED ORDER — DULOXETINE HCL 30 MG PO CPEP: 30.0000 mg | ORAL_CAPSULE | Freq: Every day | ORAL | 3 refills | Status: DC

## 2017-07-14 MED ORDER — CELECOXIB 200 MG PO CAPS: ORAL_CAPSULE | ORAL | 2 refills | Status: DC

## 2017-07-14 NOTE — Assessment & Plan Note (Addendum)
Patient is very histrionic. Depressed, pain all over. There is an element of costochondritis but I also think fibromyalgia is at play here. Labs were unremarkable, x-rays unremarkable, clinically stable, exam is benign. Adding Cymbalta, discontinue Lexapro. Celebrex for costochondritis, typical NSAIDs will worsen her gastritis. Return in 1 month for another PHQ/GAD

## 2017-07-14 NOTE — Assessment & Plan Note (Signed)
Rheumatoid work-up.

## 2017-07-14 NOTE — Progress Notes (Signed)
Subjective:    CC: Follow-up  HPI: This is a 62 year old female, she returns to discuss her epigastric pain, cough.  Epigastric pain got better with Nexium, she still has some anterior chest wall pain, pain over the shoulders, hips, thighs, back.  Everything hurts, mostly when she moves.  On further questioning she endorses some depressive symptoms that she relates to her pain, she tells that she is not happy and her family members have noticed.  We did some labs at the last visit which were negative, chest x-ray was negative, d-dimer negative.  Patient desires rheumatoid work-up.  I reviewed the past medical history, family history, social history, surgical history, and allergies today and no changes were needed.  Please see the problem list section below in epic for further details.  Past Medical History: Past Medical History:  Diagnosis Date  . Diabetes mellitus without complication ([place])   . Hyperlipidemia   . Hypertension    Past Surgical History: Past Surgical History:  Procedure Laterality Date  . BREAST ENHANCEMENT SURGERY    . ESOPHAGEAL MANOMETRY N/A 07/02/2013   Procedure: ESOPHAGEAL MANOMETRY (EM);  Surgeon: Jerene Bears, MD;  Location: [facility];  Service: Gastroenterology;  Laterality: N/A;  . TONSILECTOMY/ADENOIDECTOMY WITH MYRINGOTOMY    . TUBAL LIGATION    . tummy tuck     Social History: Social History   Socioeconomic History  . Marital status: Married    Spouse name: Not on file  . Number of children: 2  . Years of education: Not on file  . Highest education level: Not on file  Occupational History  . Occupation: Retired  Scientific laboratory technician  . Financial resource strain: Not on file  . Food insecurity:    Worry: Not on file    Inability: Not on file  . Transportation needs:    Medical: Not on file    Non-medical: Not on file  Tobacco Use  . Smoking status: Former Smoker    Last attempt to quit: 03/23/1991    Years since quitting: 26.3  . Smokeless  tobacco: Never Used  Substance and Sexual Activity  . Alcohol use: No  . Drug use: No  . Sexual activity: Yes    Birth control/protection: Surgical, Post-menopausal  Lifestyle  . Physical activity:    Days per week: Not on file    Minutes per session: Not on file  . Stress: Not on file  Relationships  . Social connections:    Talks on phone: Not on file    Gets together: Not on file    Attends religious service: Not on file    Active member of club or organization: Not on file    Attends meetings of clubs or organizations: Not on file    Relationship status: Not on file  Other Topics Concern  . Not on file  Social History Narrative  . Not on file   Family History: Family History  Problem Relation Age of Onset  . Hyperlipidemia Mother   . Diabetes Father   . Hyperlipidemia Father   . Hypertension Father   . Diabetes Maternal Grandfather   . Hyperlipidemia Maternal Grandfather   . Diabetes Paternal Grandfather   . Hyperlipidemia Paternal Grandfather    Allergies: No Known Allergies Medications: See med rec.  Review of Systems: No fevers, chills, night sweats, weight loss, chest pain, or shortness of breath.   Objective:    General: Well Developed, well nourished, and in no acute distress.  Neuro: Alert and oriented  x3, extra-ocular muscles intact, sensation grossly intact.  HEENT: Normocephalic, atraumatic, pupils equal round reactive to light, neck supple, no masses, no lymphadenopathy, thyroid nonpalpable.  Skin: Warm and dry, no rashes. Cardiac: Regular rate and rhythm, no murmurs rubs or gallops, no lower extremity edema.  Respiratory: Clear to auscultation bilaterally. Not using accessory muscles, speaking in full sentences.  Impression and Recommendations:    Cough Resolved.  Myofascial pain syndrome Patient is very histrionic. Depressed, pain all over. There is an element of costochondritis but I also think fibromyalgia is at play here. Labs were  unremarkable, x-rays unremarkable, clinically stable, exam is benign. Adding Cymbalta, discontinue Lexapro. Celebrex for costochondritis, typical NSAIDs will worsen her gastritis. Return in 1 month for another PHQ/GAD  Polyarthralgia Rheumatoid work-up.  I spent 25 minutes with this patient, greater than 50% was face-to-face time counseling regarding the above diagnoses ___________________________________________ Gwen Her. Dianah Field, M.D., ABFM., CAQSM. Primary Care and [place] Instructor of [place] [employer] [facility] [employer]

## 2017-07-14 NOTE — Assessment & Plan Note (Signed)
Resolved

## 2017-07-15 LAB — SEDIMENTATION RATE: Sed Rate: 14 mm/h (ref 0–30)

## 2017-07-15 LAB — URIC ACID: Uric Acid, Serum: 6.4 mg/dL (ref 2.5–7.0)

## 2017-07-15 LAB — CK: Total CK: 63 U/L (ref 29–143)

## 2017-07-15 LAB — HLA-B27 ANTIGEN: HLA-B27 Antigen: NEGATIVE

## 2017-07-18 LAB — PROTEIN ELECTROPHORESIS, SERUM, WITH REFLEX
Albumin ELP: 4.4 g/dL (ref 3.8–4.8)
Alpha 1: 0.2 g/dL (ref 0.2–0.3)
Alpha 2: 0.6 g/dL (ref 0.5–0.9)
Beta 2: 0.4 g/dL (ref 0.2–0.5)
Beta Globulin: 0.4 g/dL (ref 0.4–0.6)
Gamma Globulin: 1.1 g/dL (ref 0.8–1.7)
Total Protein: 7.1 g/dL (ref 6.1–8.1)

## 2017-07-18 LAB — ANA, IFA COMPREHENSIVE PANEL
Anti Nuclear Antibody(ANA): NEGATIVE
ENA SM Ab Ser-aCnc: <1 AI
SM/RNP: <1 AI
SSA (Ro) (ENA) Antibody, IgG: <1 AI
SSB (La) (ENA) Antibody, IgG: <1 AI
Scleroderma (Scl-70) (ENA) Antibody, IgG: <1 AI
ds DNA Ab: <1 [IU]/mL

## 2017-07-21 ENCOUNTER — Encounter: Payer: Self-pay | Admitting: Obstetrics & Gynecology

## 2017-07-21 ENCOUNTER — Ambulatory Visit: Payer: BLUE CROSS/BLUE SHIELD | Admitting: Obstetrics & Gynecology

## 2017-07-21 VITALS — BP 132/83 | HR 69 | Ht 62.0 in | Wt 177.0 lb

## 2017-07-21 DIAGNOSIS — R32 Unspecified urinary incontinence: Secondary | ICD-10-CM

## 2017-07-21 LAB — RHEUMATOID ARTHRITIS DIAGNOSTIC PANEL, COMPREHENSIVE
Cyclic Citrullin Peptide Ab: <16 U (ref <20)
Rheumatoid Factor (IgA): <5 U (ref <=6)
Rheumatoid Factor (IgG): <5 U (ref <=6)
Rheumatoid Factor (IgM): 7 U — ABNORMAL HIGH (ref <=6)
SSA (Ro) (ENA) Antibody, IgG: <1 AI (ref <1.0)
SSB (La) (ENA) Antibody, IgG: <1 AI (ref <1.0)

## 2017-07-21 NOTE — Progress Notes (Signed)
PT c/o urinay incontinence

## 2017-07-21 NOTE — Progress Notes (Signed)
   Subjective:    Patient ID: Diane Brooks, female    DOB: 04/19/1955, 62 y.o.   MRN: [id number]  HPI 62 yo married P2 here today with new onset urinary leaking for about a month. This happens when she coughs and sneezes. She denies urge incontinence. She denies nocturia or voiding during sex.    Review of Systems Pap due 10/19    Objective:   Physical Exam Breathing, conversing, and ambulating normally Well nourished, well hydrated Latina, no apparent distress Abd- benign Cervix- no lesions, atrophic No cystocele noted      Assessment & Plan:  Urinary incontinence- refer to urology

## 2017-07-25 ENCOUNTER — Ambulatory Visit: Payer: BLUE CROSS/BLUE SHIELD | Admitting: Physician Assistant

## 2017-08-19 ENCOUNTER — Ambulatory Visit: Payer: BLUE CROSS/BLUE SHIELD | Admitting: Physician Assistant

## 2017-08-19 ENCOUNTER — Encounter: Payer: Self-pay | Admitting: Physician Assistant

## 2017-08-19 VITALS — BP 133/58 | HR 89 | Temp 99.8°F | Wt 175.0 lb

## 2017-08-19 DIAGNOSIS — J40 Bronchitis, not specified as acute or chronic: Secondary | ICD-10-CM | POA: Diagnosis not present

## 2017-08-19 DIAGNOSIS — J329 Chronic sinusitis, unspecified: Secondary | ICD-10-CM | POA: Diagnosis not present

## 2017-08-19 MED ORDER — HYDROCODONE-HOMATROPINE 5-1.5 MG/5ML PO SYRP: 5.0000 mL | ORAL_SOLUTION | Freq: Two times a day (BID) | ORAL | 0 refills | Status: DC | PRN

## 2017-08-19 MED ORDER — LEVOCETIRIZINE DIHYDROCHLORIDE 5 MG PO TABS: 5.0000 mg | ORAL_TABLET | Freq: Every evening | ORAL | 11 refills | Status: DC

## 2017-08-19 MED ORDER — METHYLPREDNISOLONE 4 MG PO TBPK: ORAL_TABLET | ORAL | 0 refills | Status: DC

## 2017-08-19 MED ORDER — AMOXICILLIN-POT CLAVULANATE 875-125 MG PO TABS: 1.0000 | ORAL_TABLET | Freq: Two times a day (BID) | ORAL | 0 refills | Status: DC

## 2017-08-19 NOTE — Patient Instructions (Signed)

## 2017-08-21 NOTE — Progress Notes (Signed)
Subjective:    Patient ID: Diane Brooks, female    DOB: 1956/01/24, 62 y.o.   MRN: [id number]  HPI Pt is a 62 yo female who presents to the clinic with ST, cough, sinus pressure, ear pain for last 2 weeks. She has been taking mucinex, sudafed, claritin with no relief. No fever. She does describe chills and body aches. No sick contacts. Cough is keeping her up at night.   .. Active Ambulatory Problems    Diagnosis Date Noted  . Hyperlipidemia 03/28/2013  . Hiatal hernia 03/28/2013  . Essential hypertension, benign 03/28/2013  . Diabetes mellitus type II, controlled ([place]) 03/28/2013  . GERD (gastroesophageal reflux disease) 05/10/2013  . Globus sensation 05/10/2013  . Atypical chest pain 05/10/2013  . Internal hemorrhoids 05/10/2013  . Decreased libido 05/10/2013  . High risk HPV infection 05/24/2013  . Colon cancer screening 05/25/2013  . Obesity (BMI 30-39.9) 06/27/2013  . Diabetes mellitus ([place]) 06/27/2013  . Abnormal weight gain 06/27/2013  . Hyperlipemia 06/27/2013  . Cyst, eyelid sebaceous 06/27/2013  . Onychomycosis 06/27/2013  . 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 Sudoriferous cyst 07/17/2013  . Seasonal allergies 08/03/2013  . Allergic rhinitis 08/03/2013  . Tinea versicolor 08/03/2013  . Obesity, unspecified 10/29/2013  . Hot flashes 10/29/2013  . Abdominal pain, epigastric 11/09/2013  . Migraine headache without aura 10/16/2015  . Mid back pain 10/24/2015  . Left-sided low back pain with left-sided sciatica 10/24/2015  . Neck pain 10/24/2015  . De Quervain's disease (radial styloid tenosynovitis) 11/14/2015  . BPV (benign positional vertigo) 11/14/2015  . Muscle spasm of back 11/14/2015  . Polyarthralgia 11/19/2015  . Mouth sores 12/15/2015  . Black stools 12/15/2015  . Light stools 03/03/2016  . Systolic murmur 30/11/2328  . Low serum vitamin B12 04/20/2017  . Memory changes 04/24/2017  . Itching 06/13/2017  . Anxiety 06/13/2017  . Lipoma of left lower  extremity 06/13/2017  . Reactive airway disease 07/03/2017  . Cough 07/03/2017  . Shortness of breath 07/07/2017  . Myofascial pain syndrome 07/14/2017   Resolved Ambulatory Problems    Diagnosis Date Noted  . No Resolved Ambulatory Problems   Past Medical History:  Diagnosis Date  . Diabetes mellitus without complication ([place])   . Hyperlipidemia   . Hypertension       Review of Systems  All other systems reviewed and are negative.      Objective:   Physical Exam  Constitutional: She is oriented to person, place, and time. She appears well-developed and well-nourished.  HENT:  Head: Normocephalic and atraumatic.  Right Ear: Tympanic membrane normal.  Left Ear: Tympanic membrane normal.  Mouth/Throat: Uvula is midline and mucous membranes are normal. No oral lesions. No oropharyngeal exudate.  Eyes: Pupils are equal, round, and reactive to light. EOM are normal.  Cardiovascular: Normal rate, regular rhythm and normal heart sounds.  Pulmonary/Chest: Effort normal and breath sounds normal.  Abdominal: Soft. Bowel sounds are normal.  Neurological: She is alert and oriented to person, place, and time.  Psychiatric: She has a normal mood and affect. Her behavior is normal.          Assessment & Plan:  [name] KitchenMarland KitchenValoria was seen today for cough, sore throat and generalized body aches.  Diagnoses and all orders for this visit:  Sinobronchitis -     amoxicillin-clavulanate (AUGMENTIN) 875-125 MG tablet; Take 1 tablet by mouth 2 (two) times daily. Take 1 tab twice a day for 10 days. -     methylPREDNISolone (MEDROL  DOSEPAK) 4 MG TBPK tablet; Take as directed by package. -     HYDROcodone-homatropine (HYCODAN) 5-1.5 MG/5ML syrup; Take 5 mLs by mouth every 12 (twelve) hours as needed. -     levocetirizine (XYZAL) 5 MG tablet; Take 1 tablet (5 mg total) by mouth every evening.   Discussed patient has been sicker this year more than most. Could be some allergies. Start xyzal after  finishing this round on augmentin, medrol dose pak. Hycodan at bedtime for cough.   [place] controlled substance database reviewed with no concerns.

## 2017-09-12 ENCOUNTER — Ambulatory Visit: Payer: BLUE CROSS/BLUE SHIELD | Admitting: Physician Assistant

## 2017-09-12 ENCOUNTER — Encounter: Payer: Self-pay | Admitting: Physician Assistant

## 2017-09-12 VITALS — BP 135/74

## 2017-09-12 DIAGNOSIS — E119 Type 2 diabetes mellitus without complications: Secondary | ICD-10-CM | POA: Diagnosis not present

## 2017-09-12 DIAGNOSIS — E559 Vitamin D deficiency, unspecified: Secondary | ICD-10-CM | POA: Insufficient documentation

## 2017-09-12 DIAGNOSIS — B351 Tinea unguium: Secondary | ICD-10-CM | POA: Diagnosis not present

## 2017-09-12 DIAGNOSIS — R809 Proteinuria, unspecified: Secondary | ICD-10-CM

## 2017-09-12 DIAGNOSIS — E538 Deficiency of other specified B group vitamins: Secondary | ICD-10-CM | POA: Diagnosis not present

## 2017-09-12 DIAGNOSIS — Z1231 Encounter for screening mammogram for malignant neoplasm of breast: Secondary | ICD-10-CM

## 2017-09-12 DIAGNOSIS — I10 Essential (primary) hypertension: Secondary | ICD-10-CM

## 2017-09-12 LAB — POCT GLYCOSYLATED HEMOGLOBIN (HGB A1C): Hemoglobin A1C: 6.5 % — AB (ref 4.0–5.6)

## 2017-09-12 LAB — POCT UA - MICROALBUMIN
CREATININE, POC: 300 mg/dL
Microalbumin Ur, POC: 80 mg/L

## 2017-09-12 MED ORDER — CICLOPIROX 8 % EX SOLN
Freq: Every day | CUTANEOUS | 0 refills | Status: DC
Start: 2017-09-12 — End: 2018-03-07

## 2017-09-12 MED ORDER — LISINOPRIL 5 MG PO TABS: 5.0000 mg | ORAL_TABLET | Freq: Every day | ORAL | 1 refills | Status: DC

## 2017-09-12 NOTE — Progress Notes (Signed)
Call pt: there is some protein in urine. I suggest a medication called lisinopril to help protect your kidneys from any damage from increased sugars. Are you ok with this?

## 2017-09-12 NOTE — Progress Notes (Signed)
Done

## 2017-09-12 NOTE — Progress Notes (Signed)
Subjective:    Patient ID: Diane Brooks, female    DOB: February 23, 1956, 62 y.o.   MRN: [id number]  HPI  Pt is a 62 yo female with T2DM, HTN who presents to the clinic for 28month follow up.   DM- checking sugars in the am and running 111, 98, 125 on average. No hypoglycemia. No open sores or wounds. No vision changes. No up to date eye exam. She is not exercising.   She does request I look at dark toenail. No pain. Not tried anything.   Denies any CP, palpitations, headaches, or vision changes.   .. Active Ambulatory Problems    Diagnosis Date Noted  . Hyperlipidemia 03/28/2013  . Hiatal hernia 03/28/2013  . Essential hypertension, benign 03/28/2013  . Diabetes mellitus type II, controlled ([place]) 03/28/2013  . GERD (gastroesophageal reflux disease) 05/10/2013  . Globus sensation 05/10/2013  . Atypical chest pain 05/10/2013  . Internal hemorrhoids 05/10/2013  . Decreased libido 05/10/2013  . High risk HPV infection 05/24/2013  . Colon cancer screening 05/25/2013  . Obesity (BMI 30-39.9) 06/27/2013  . Diabetes mellitus ([place]) 06/27/2013  . Abnormal weight gain 06/27/2013  . Hyperlipemia 06/27/2013  . Cyst, eyelid sebaceous 06/27/2013  . Onychomycosis 06/27/2013  . 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 Sudoriferous cyst 07/17/2013  . Seasonal allergies 08/03/2013  . Allergic rhinitis 08/03/2013  . Tinea versicolor 08/03/2013  . Obesity, unspecified 10/29/2013  . Hot flashes 10/29/2013  . Abdominal pain, epigastric 11/09/2013  . Migraine headache without aura 10/16/2015  . Mid back pain 10/24/2015  . Left-sided low back pain with left-sided sciatica 10/24/2015  . Neck pain 10/24/2015  . De Quervain's disease (radial styloid tenosynovitis) 11/14/2015  . BPV (benign positional vertigo) 11/14/2015  . Muscle spasm of back 11/14/2015  . Polyarthralgia 11/19/2015  . Mouth sores 12/15/2015  . Black stools 12/15/2015  . Light stools 03/03/2016  . Systolic murmur 01/75/1025  . Low  serum vitamin B12 04/20/2017  . Memory changes 04/24/2017  . Itching 06/13/2017  . Anxiety 06/13/2017  . Lipoma of left lower extremity 06/13/2017  . Reactive airway disease 07/03/2017  . Cough 07/03/2017  . Shortness of breath 07/07/2017  . Myofascial pain syndrome 07/14/2017  . Vitamin D deficiency 09/12/2017  . Visit for screening mammogram 09/12/2017  . Microalbuminuria 09/13/2017   Resolved Ambulatory Problems    Diagnosis Date Noted  . No Resolved Ambulatory Problems   Past Medical History:  Diagnosis Date  . Diabetes mellitus without complication ([place])   . Hyperlipidemia   . Hypertension         Review of Systems See HPI.     Objective:   Physical Exam  Constitutional: She is oriented to person, place, and time. She appears well-developed and well-nourished.  HENT:  Head: Normocephalic and atraumatic.  Cardiovascular: Normal rate and regular rhythm.  Pulmonary/Chest: Effort normal and breath sounds normal.  Musculoskeletal:  2nd toe left foot dark, thick yellow.   Neurological: She is alert and oriented to person, place, and time.  Psychiatric: She has a normal mood and affect. Her behavior is normal.          Assessment & Plan:  [name] KitchenMarland KitchenDiagnoses and all orders for this visit:  Controlled type 2 diabetes mellitus without complication, without long-term current use of insulin (HCC) -     POCT UA - Microalbumin -     POCT HgB A1C -     lisinopril (PRINIVIL,ZESTRIL) 5 MG tablet; Take 1 tablet (5 mg total) by mouth  daily.  Visit for screening mammogram -     MM DIGITAL SCREENING W/ IMPLANTS BILATERAL  Vitamin D deficiency -     VITAMIN D 25 Hydroxy (Vit-D Deficiency, Fractures)  Low serum vitamin B12 -     B12  Essential hypertension, benign -     lisinopril (PRINIVIL,ZESTRIL) 5 MG tablet; Take 1 tablet (5 mg total) by mouth daily.  Onychomycosis -     ciclopirox (PENLAC) 8 % solution; Apply topically at bedtime. Apply over nail and surrounding skin.  Apply daily over previous coat. After seven (7) days, may remove with alcohol and continue cycle.  Microalbuminuria -     lisinopril (PRINIVIL,ZESTRIL) 5 MG tablet; Take 1 tablet (5 mg total) by mouth daily.   .. Results for orders placed or performed in visit on 09/12/17  VITAMIN D 25 Hydroxy (Vit-D Deficiency, Fractures)  Result Value Ref Range   Vit D, 25-Hydroxy 32 30 - 100 ng/mL  B12  Result Value Ref Range   Vitamin B-12 527 200 - 1,100 pg/mL  POCT UA - Microalbumin  Result Value Ref Range   Microalbumin Ur, POC 80 mg/L   Creatinine, POC 300 mg/dL   Albumin/Creatinine Ratio, Urine, POC 30-300   POCT HgB A1C  Result Value Ref Range   Hemoglobin A1C 6.5 (A) 4.0 - 5.6 %   HbA1c POC (<> result, manual entry)  4.0 - 5.6 %   HbA1c, POC (prediabetic range)  5.7 - 6.4 %   HbA1c, POC (controlled diabetic range)  0.0 - 7.0 %    A!C is controlled.  Continue on same medications.  Lisinopril added due to positive microalbuminia.  On STATIN.  Need annual eye exam.   Recheck vitamin D and b12. And make sure supplements are where they need to be.   She does have some toenail fungus. Sent penlac. Discussed treatment.

## 2017-09-13 ENCOUNTER — Encounter: Payer: Self-pay | Admitting: Physician Assistant

## 2017-09-13 ENCOUNTER — Encounter: Payer: Self-pay | Admitting: Nurse Practitioner

## 2017-09-13 ENCOUNTER — Ambulatory Visit: Payer: BLUE CROSS/BLUE SHIELD | Admitting: Nurse Practitioner

## 2017-09-13 VITALS — BP 140/88 | HR 76 | Ht 62.0 in | Wt 177.8 lb

## 2017-09-13 DIAGNOSIS — K219 Gastro-esophageal reflux disease without esophagitis: Secondary | ICD-10-CM | POA: Diagnosis not present

## 2017-09-13 DIAGNOSIS — K222 Esophageal obstruction: Secondary | ICD-10-CM

## 2017-09-13 DIAGNOSIS — R1310 Dysphagia, unspecified: Secondary | ICD-10-CM | POA: Diagnosis not present

## 2017-09-13 DIAGNOSIS — R12 Heartburn: Secondary | ICD-10-CM | POA: Diagnosis not present

## 2017-09-13 DIAGNOSIS — R809 Proteinuria, unspecified: Secondary | ICD-10-CM | POA: Insufficient documentation

## 2017-09-13 DIAGNOSIS — R110 Nausea: Secondary | ICD-10-CM

## 2017-09-13 LAB — VITAMIN B12: Vitamin B-12: 527 pg/mL (ref 200–1100)

## 2017-09-13 LAB — VITAMIN D 25 HYDROXY (VIT D DEFICIENCY, FRACTURES): Vit D, 25-Hydroxy: 32 ng/mL (ref 30–100)

## 2017-09-13 MED ORDER — SUCRALFATE 1 G PO TABS: 1.0000 g | ORAL_TABLET | Freq: Three times a day (TID) | ORAL | 2 refills | Status: DC

## 2017-09-13 NOTE — Patient Instructions (Signed)
If you are age 60 or older, your body mass index should be between 23-30. Your Body mass index is 32.52 kg/m. If this is out of the aforementioned range listed, please consider follow up with your Primary Care Provider.  If you are age 53 or younger, your body mass index should be between 19-25. Your Body mass index is 32.52 kg/m. If this is out of the aformentioned range listed, please consider follow up with your Primary Care Provider.   You have been scheduled for an endoscopy. Please follow written instructions given to you at your visit today. If you use inhalers (even only as needed), please bring them with you on the day of your procedure. Your physician has requested that you go to www.startemmi.com and enter the access code given to you at your visit today. This web site gives a general overview about your procedure. However, you should still follow specific instructions given to you by our office regarding your preparation for the procedure.  We have sent the following medications to your pharmacy for you to pick up at your convenience: Carafate  Thank you for choosing me and [facility].   Tye Savoy, NP

## 2017-09-13 NOTE — Progress Notes (Addendum)
IMPRESSION and PLAN:    #1. 62 yo female with longstanding history of GERD. Did well on BID PPI for last few years but now with severe heartburn, regurgitation, mild nausea. Also having recurrent solid food dysphagia. Mild Schtazki's / small hiatal hernia on EGD in 2015. Also, suggestion of cervical esophageal web on barium swallow in 2015.  -continue ant-reflux measures, she sleeps on a wedge pillow -continue BID PPI -trial of Carafate  -For dysphagia it is reasonable to proceed with EGD with possible dilation. The risks and benefits of EGD were discussed and the patient agrees to proceed.    #2. Colon cancer screening. She is up to date, due again in 2021.   Addendum: Reviewed and agree with initial management. Pyrtle, Lajuan Lines, MD       HPI:    Chief Complaint: GERD and swallowing problems   Patient is a 62 yo female known to Dr. Hilarie Fredrickson for a hx of GERD. She had a nonobstructing partial Schatzki's ring with a small hiatal hernia on EGD Aug 2015 done for GERD and dysphagia. Esophagram in 2015 suggested presence of an esophageal web at [employer]. She takes BID Nexium (years). Sleeps on a wedge pillow.   Quincy Simmonds gives a hx of chronic globus, always clearing her throat. Over the last couple of weeks she has had terrible burning in her chest, increased regurgitation and some nausea without vomiting. Thinks stress is exacerbating her GERD symptoms (planning some family events for son). She has no SOB or chest pressure, just burning. She is also having problems swallowing solids. Everything she eats feels like it temporarily lodges in esophagus, then she can feel it going down slowly. No lower GI sx. Unremarkable screening colonoscopy Nov 2011 at outside facility.   Past Medical History:  Diagnosis Date  . Diabetes mellitus without complication ([place])   . Hyperlipidemia   . Hypertension    Past Surgical History:  Procedure Laterality Date  . BREAST ENHANCEMENT SURGERY    . ESOPHAGEAL  MANOMETRY N/A 07/02/2013   Procedure: ESOPHAGEAL MANOMETRY (EM);  Surgeon: Jerene Bears, MD;  Location: [facility];  Service: Gastroenterology;  Laterality: N/A;  . TONSILECTOMY/ADENOIDECTOMY WITH MYRINGOTOMY    . TUBAL LIGATION    . tummy tuck     Family History  Problem Relation Age of Onset  . Hyperlipidemia Mother   . Diabetes Father   . Hyperlipidemia Father   . Hypertension Father   . Diabetes Maternal Grandfather   . Hyperlipidemia Maternal Grandfather   . Diabetes Paternal Grandfather   . Hyperlipidemia Paternal Grandfather      Review of systems:    All systems reviewed and negative except where noted in HPI  Past Medical History:  Diagnosis Date  . Diabetes mellitus without complication ([place])   . Hyperlipidemia   . Hypertension    Medications reviewed in Epic.   Creatinine clearance cannot be calculated (Patient's most recent lab result is older than the maximum 21 days allowed.)   Physical Exam:     BP 140/88   Pulse 76   Ht 5\' 2"  (1.575 m)   Wt 177 lb 12.8 oz (80.6 kg)   BMI 32.52 kg/m   GENERAL:  Pleasant female in NAD PSYCH: : Cooperative, normal affect EENT:  conjunctiva pink, mucous membranes moist, neck supple without masses CARDIAC:  RRR,  no peripheral edema PULM: Normal respiratory effort, lungs CTA bilaterally, no wheezing ABDOMEN:  Nondistended, soft, nontender. No obvious masses, no hepatomegaly,  normal bowel sounds SKIN:  turgor, no lesions seen Musculoskeletal:  Normal muscle tone, normal strength NEURO: Alert and oriented x 3, no focal neurologic deficits   Tye Savoy , NP 09/13/2017, 9:37 AM

## 2017-09-13 NOTE — Progress Notes (Signed)
Call pt: vitamin D low normal range. Stay on OTC 2,000 units daily D3.  b12 looks sooo much better keep supplement the same![name] Kitchen

## 2017-09-13 NOTE — Progress Notes (Signed)
No kidney damage done yet but starting to spill protein can be the first sign of to come. That is why we want you do start medication. Kidneys look fabulous right now.

## 2017-09-14 ENCOUNTER — Other Ambulatory Visit: Payer: Self-pay

## 2017-09-14 ENCOUNTER — Other Ambulatory Visit: Payer: Self-pay | Admitting: Physician Assistant

## 2017-09-14 ENCOUNTER — Ambulatory Visit (AMBULATORY_SURGERY_CENTER): Payer: BLUE CROSS/BLUE SHIELD | Admitting: Internal Medicine

## 2017-09-14 ENCOUNTER — Encounter: Payer: Self-pay | Admitting: Internal Medicine

## 2017-09-14 ENCOUNTER — Telehealth: Payer: Self-pay

## 2017-09-14 VITALS — BP 93/52 | HR 59 | Temp 96.4°F | Resp 15 | Ht 62.0 in | Wt 177.0 lb

## 2017-09-14 DIAGNOSIS — R1310 Dysphagia, unspecified: Secondary | ICD-10-CM

## 2017-09-14 DIAGNOSIS — K219 Gastro-esophageal reflux disease without esophagitis: Secondary | ICD-10-CM | POA: Diagnosis not present

## 2017-09-14 DIAGNOSIS — R1319 Other dysphagia: Secondary | ICD-10-CM

## 2017-09-14 DIAGNOSIS — E119 Type 2 diabetes mellitus without complications: Secondary | ICD-10-CM

## 2017-09-14 MED ORDER — LISINOPRIL 5 MG PO TABS: 5.0000 mg | ORAL_TABLET | Freq: Every day | ORAL | 1 refills | Status: DC

## 2017-09-14 MED ORDER — SODIUM CHLORIDE 0.9 % IV SOLN: 500.0000 mL | Freq: Once | INTRAVENOUS | Status: DC

## 2017-09-14 NOTE — Progress Notes (Signed)
Called to room to assist during endoscopic procedure.  Patient ID and intended procedure confirmed with present staff. Received instructions for my participation in the procedure from the performing physician.  

## 2017-09-14 NOTE — Op Note (Signed)
[place] Patient Name: Diane Brooks Procedure Date: 09/14/2017 8:05 AM MRN: [id number] Endoscopist: Jerene Bears , MD Age: 62 Referring MD:  Date of Birth: [birth year] Gender: Female Account #: [id number] Procedure:                Upper GI endoscopy Indications:              Dysphagia, Heartburn Medicines:                Monitored Anesthesia Care Procedure:                Pre-Anesthesia Assessment:                           - Prior to the procedure, a History and Physical                            was performed, and patient medications and                            allergies were reviewed. The patient's tolerance of                            previous anesthesia was also reviewed. The risks                            and benefits of the procedure and the sedation                            options and risks were discussed with the patient.                            All questions were answered, and informed consent                            was obtained. Prior Anticoagulants: The patient has                            taken no previous anticoagulant or antiplatelet                            agents. ASA Grade Assessment: II - A patient with                            mild systemic disease. After reviewing the risks                            and benefits, the patient was deemed in                            satisfactory condition to undergo the procedure.                           After obtaining informed consent, the endoscope was  passed under direct vision. Throughout the                            procedure, the patient's blood pressure, pulse, and                            oxygen saturations were monitored continuously. The                            Endoscope was introduced through the mouth, and                            advanced to the second part of duodenum. The upper                            GI endoscopy was accomplished without  difficulty.                            The patient tolerated the procedure well. Scope In: Scope Out: Findings:                 Normal mucosa was found in the entire esophagus.                            Z-line is slightly irregular as before, but does                            not extend above the top of the gastric folds                            (previous biopsies negative for Barrett's                            metaplasia).                           No endoscopic abnormality was evident in the                            esophagus to explain the patient's complaint of                            dysphagia. It was decided, however, to proceed with                            dilation of the entire esophagus. A guidewire was                            placed and the scope was withdrawn. Dilation was                            performed with a Savary dilator with mild  resistance at 17 mm.                           The entire examined stomach was normal.                           The examined duodenum was normal. Complications:            No immediate complications. Estimated Blood Loss:     Estimated blood loss: none. Impression:               - Normal mucosa was found in the entire esophagus.                            Esophagus dilated to 17 mm with Savary.                           - Normal stomach.                           - Normal examined duodenum.                           - No specimens collected. Recommendation:           - Patient has a contact number available for                            emergencies. The signs and symptoms of potential                            delayed complications were discussed with the                            patient. Return to normal activities tomorrow.                            Written discharge instructions were provided to the                            patient.                           - Resume previous diet.                            - Continue present medication including twice daily                            Nexium and newly added sucralfate slurry three                            times daily before meals and at bedtime.                           - If symptoms not improved please contact my office  for follow-up. Jerene Bears, MD 09/14/2017 8:27:43 AM This report has been signed electronically.

## 2017-09-14 NOTE — Progress Notes (Signed)
Report given to PACU, vss 

## 2017-09-14 NOTE — Telephone Encounter (Signed)
New RX for Lisinopril did not get sent to [facility] for pt to start.   Please sent and route back to me so I can let pt know.  Thanks!

## 2017-09-14 NOTE — Patient Instructions (Signed)
Continue Nexium twice daily and start Carafate slurry three times daily and at bedtime. Handouts given on stricture and dysphagia diet.    YOU HAD AN ENDOSCOPIC PROCEDURE TODAY AT [facility]:   Refer to the procedure report that was given to you for any specific questions about what was found during the examination.  If the procedure report does not answer your questions, please call your gastroenterologist to clarify.  If you requested that your care partner not be given the details of your procedure findings, then the procedure report has been included in a sealed envelope for you to review at your convenience later.  YOU SHOULD EXPECT: Some feelings of bloating in the abdomen. Passage of more gas than usual.  Walking can help get rid of the air that was put into your GI tract during the procedure and reduce the bloating. If you had a lower endoscopy (such as a colonoscopy or flexible sigmoidoscopy) you may notice spotting of blood in your stool or on the toilet paper. If you underwent a bowel prep for your procedure, you may not have a normal bowel movement for a few days.  Please Note:  You might notice some irritation and congestion in your nose or some drainage.  This is from the oxygen used during your procedure.  There is no need for concern and it should clear up in a day or so.  SYMPTOMS TO REPORT IMMEDIATELY:   Following upper endoscopy (EGD)  Vomiting of blood or coffee ground material  New chest pain or pain under the shoulder blades  Painful or persistently difficult swallowing  New shortness of breath  Fever of 100F or higher  Black, tarry-looking stools  For urgent or emergent issues, a gastroenterologist can be reached at any hour by calling [phone].   DIET:  Refer to dysphagia diet.  Drink plenty of fluids but you should avoid alcoholic beverages for 24 hours.  ACTIVITY:  You should plan to take it easy for the rest of today and you should NOT  DRIVE or use heavy machinery until tomorrow (because of the sedation medicines used during the test).    FOLLOW UP: Our staff will call the number listed on your records the next business day following your procedure to check on you and address any questions or concerns that you may have regarding the information given to you following your procedure. If we do not reach you, we will leave a message.  However, if you are feeling well and you are not experiencing any problems, there is no need to return our call.  We will assume that you have returned to your regular daily activities without incident.  If any biopsies were taken you will be contacted by phone or by letter within the next 1-3 weeks.  Please call us at [phone] if you have not heard about the biopsies in 3 weeks.    SIGNATURES/CONFIDENTIALITY: You and/or your care partner have signed paperwork which will be entered into your electronic medical record.  These signatures attest to the fact that that the information above on your After Visit Summary has been reviewed and is understood.  Full responsibility of the confidentiality of this discharge information lies with you and/or your care-partner.

## 2017-09-15 ENCOUNTER — Telehealth: Payer: Self-pay | Admitting: *Deleted

## 2017-09-15 NOTE — Telephone Encounter (Signed)
  Follow up Call-  Call back number 09/14/2017  Post procedure Call Back phone  # [phone]  Permission to leave phone message Yes  Some recent data might be hidden     Patient questions:  Do you have a fever, pain , or abdominal swelling? No. Pain Score  0 *  Have you tolerated food without any problems? Yes.    Have you been able to return to your normal activities? Yes.    Do you have any questions about your discharge instructions: Diet   No. Medications  No. Follow up visit  No.  Do you have questions or concerns about your Care? No.  Actions: * If pain score is 4 or above: No action needed, pain <4.

## 2017-11-04 ENCOUNTER — Ambulatory Visit
Admission: RE | Admit: 2017-11-04 | Discharge: 2017-11-04 | Disposition: A | Discharge status: Home / Self Care | Payer: BLUE CROSS/BLUE SHIELD | Source: Ambulatory Visit | Attending: Physician Assistant | Admitting: Physician Assistant

## 2017-11-05 NOTE — Progress Notes (Signed)
Call pt: normal mammogram. Follow up in one year.

## 2017-11-10 ENCOUNTER — Ambulatory Visit (INDEPENDENT_AMBULATORY_CARE_PROVIDER_SITE_OTHER): Payer: BLUE CROSS/BLUE SHIELD | Admitting: Physician Assistant

## 2017-11-10 ENCOUNTER — Encounter: Payer: Self-pay | Admitting: Physician Assistant

## 2017-11-10 VITALS — BP 147/89 | HR 72 | Temp 98.1°F | Ht 62.0 in | Wt 172.0 lb

## 2017-11-10 DIAGNOSIS — R05 Cough: Secondary | ICD-10-CM | POA: Diagnosis not present

## 2017-11-10 DIAGNOSIS — I152 Hypertension secondary to endocrine disorders: Secondary | ICD-10-CM

## 2017-11-10 DIAGNOSIS — I10 Essential (primary) hypertension: Secondary | ICD-10-CM

## 2017-11-10 DIAGNOSIS — R809 Proteinuria, unspecified: Secondary | ICD-10-CM

## 2017-11-10 DIAGNOSIS — E1129 Type 2 diabetes mellitus with other diabetic kidney complication: Secondary | ICD-10-CM | POA: Diagnosis not present

## 2017-11-10 DIAGNOSIS — E1159 Type 2 diabetes mellitus with other circulatory complications: Secondary | ICD-10-CM | POA: Diagnosis not present

## 2017-11-10 DIAGNOSIS — R058 Other specified cough: Secondary | ICD-10-CM

## 2017-11-10 DIAGNOSIS — K219 Gastro-esophageal reflux disease without esophagitis: Secondary | ICD-10-CM | POA: Diagnosis not present

## 2017-11-10 MED ORDER — LOSARTAN POTASSIUM 25 MG PO TABS: 25.0000 mg | ORAL_TABLET | Freq: Every day | ORAL | 0 refills | Status: DC

## 2017-11-10 MED ORDER — HYDROCOD POLST-CPM POLST ER 10-8 MG/5ML PO SUER: 5.0000 mL | Freq: Two times a day (BID) | ORAL | 0 refills | Status: AC | PRN

## 2017-11-10 MED ORDER — BENZONATATE 200 MG PO CAPS: 200.0000 mg | ORAL_CAPSULE | Freq: Three times a day (TID) | ORAL | 0 refills | Status: DC | PRN

## 2017-11-10 MED ORDER — DEXLANSOPRAZOLE 60 MG PO CPDR: 60.0000 mg | DELAYED_RELEASE_CAPSULE | Freq: Every day | ORAL | 0 refills | Status: DC

## 2017-11-10 NOTE — Progress Notes (Signed)
HPI:                                                                Diane Brooks is a 62 y.o. female who presents to Diane Brooks: [place] today for cough  Pleasant 62 yo F with PMH GERD/hiatal hernia, HTN, DM presents with persistent non-productive cough x 2 months. States she is having severe coughing fits approx every 2 hours. Cough is disruptive and she will often have urinary incontinence. Describes a sensation "like my vocal cords are sticking together." Denies fever, chills, nightsweats, weight loss, chest pain, dyspnea, wheezing, hemoptysis. Of note, she was started on Lisinopril approx 2 months ago for microalbuminuria.  Past Medical History:  Diagnosis Date  . Diabetes mellitus without complication (Diane Brooks)   . Hyperlipidemia   . Hypertension    Past Surgical History:  Procedure Laterality Date  . BREAST ENHANCEMENT SURGERY    . ESOPHAGEAL MANOMETRY N/A 07/02/2013   Procedure: ESOPHAGEAL MANOMETRY (EM);  Surgeon: Diane Bears, MD;  Location: [facility];  Service: Gastroenterology;  Laterality: N/A;  . TONSILECTOMY/ADENOIDECTOMY WITH MYRINGOTOMY    . TUBAL LIGATION    . tummy tuck     Social History   Tobacco Use  . Smoking status: Former Smoker    Last attempt to quit: 03/23/1991    Years since quitting: 26.6  . Smokeless tobacco: Never Used  Substance Use Topics  . Alcohol use: No   family history includes Diabetes in her father, maternal grandfather, and paternal grandfather; Hyperlipidemia in her father, maternal grandfather, mother, and paternal grandfather; Hypertension in her father.    ROS: negative except as noted in the HPI  Medications: Current Outpatient Medications  Medication Sig Dispense Refill  . albuterol (PROVENTIL HFA;VENTOLIN HFA) 108 (90 Base) MCG/ACT inhaler Inhale 1-2 puffs into the lungs every 4 (four) hours as needed for wheezing or shortness of breath. 1 Inhaler 2  . AMBULATORY NON FORMULARY MEDICATION  Freestyle Glucometer   Dx: 250.00 DM, type II 1 Device 0  . AMBULATORY NON FORMULARY MEDICATION Freestyle test strips.   DX Type II diabetes 100 strip 1  . AMBULATORY NON FORMULARY MEDICATION Blood pressure cuff and supplies. 1 Device 0  . amitriptyline (ELAVIL) 10 MG tablet Take by mouth.    [name] Kitchen atenolol (TENORMIN) 25 MG tablet Take 1 tablet (25 mg total) by mouth daily. 90 tablet 1  . ciclopirox (PENLAC) 8 % solution Apply topically at bedtime. Apply over nail and surrounding skin. Apply daily over previous coat. After seven (7) days, may remove with alcohol and continue cycle. 6.6 mL 0  . FREESTYLE LITE test strip USE AS DIRECTED 100 each 1  . hydrochlorothiazide (HYDRODIURIL) 25 MG tablet Take 1 tablet (25 mg total) by mouth daily. 90 tablet 1  . metFORMIN (GLUCOPHAGE) 1000 MG tablet TAKE 1 TABLET BY MOUTH TWICE DAILY WITH MEALS 180 tablet 1  . ONGLYZA 2.5 MG TABS tablet TAKE 1 TABLET BY MOUTH ONCE DAILY 90 tablet 1  . sucralfate (CARAFATE) 1 g tablet Take 1 tablet (1 g total) by mouth 4 (four) times daily -  with meals and at bedtime. 120 tablet 2  . benzonatate (TESSALON) 200 MG capsule Take 1 capsule (200 mg total) by mouth 3 (  three) times daily as needed for cough. 30 capsule 0  . chlorpheniramine-HYDROcodone (TUSSIONEX) 10-8 MG/5ML SUER Take 5 mLs by mouth every 12 (twelve) hours as needed for up to 5 days for cough. 50 mL 0  . dexlansoprazole (DEXILANT) 60 MG capsule Take 1 capsule (60 mg total) by mouth daily. 90 capsule 0  . losartan (COZAAR) 25 MG tablet Take 1 tablet (25 mg total) by mouth daily. 90 tablet 0   Current Facility-Administered Medications  Medication Dose Route Frequency Provider Last Rate Last Dose  . 0.9 %  sodium chloride infusion  500 mL Intravenous Once Pyrtle, Diane Lines, MD       Allergies  Allergen Reactions  . Lisinopril Cough       Objective:  BP (!) 147/89   Pulse 72   Temp 98.1 F (36.7 C) (Oral)   Ht 5\' 2"  (1.575 m)   Wt 172 lb (78 kg)   SpO2  98%   BMI 31.46 kg/m  Gen:  alert, not ill-appearing, no distress, appropriate for age 61: head normocephalic without obvious abnormality, conjunctiva and cornea clear, wearing glasses, oropharynx clear, neck supple, no cervical adenopathy, trachea midline Pulm: Normal work of breathing, normal phonation, clear to auscultation bilaterally, no wheezes, rales or rhonchi CV: Normal rate, regular rhythm, s1 and s2 distinct, no murmurs, clicks or rubs  Neuro: alert and oriented x 3, no tremor MSK: extremities atraumatic, normal gait and station Skin: intact, no rashes on exposed skin, no jaundice, no cyanosis   No results found for this or any previous visit (from the past 72 hour(s)). No results found.    Assessment and Plan: 62 y.o. female with   .Diagnoses and all orders for this visit:  Recurrent nonproductive cough -     benzonatate (TESSALON) 200 MG capsule; Take 1 capsule (200 mg total) by mouth 3 (three) times daily as needed for cough. -     chlorpheniramine-HYDROcodone (Diane Brooks) 10-8 MG/5ML SUER; Take 5 mLs by mouth every 12 (twelve) hours as needed for up to 5 days for cough.  Gastroesophageal reflux disease without esophagitis -     dexlansoprazole (DEXILANT) 60 MG capsule; Take 1 capsule (60 mg total) by mouth daily.  Diabetes mellitus with microalbuminuria (HCC) -     losartan (COZAAR) 25 MG tablet; Take 1 tablet (25 mg total) by mouth daily.  Hypertension associated with diabetes (Diane Brooks) -     losartan (COZAAR) 25 MG tablet; Take 1 tablet (25 mg total) by mouth daily.   - afebrile, no tachypnea, no tachycardia, SpO2 98% on RA at rest, no adventitious lung sounds - suspect cough is due to ACE and GERD may be contributory - switching to ARB for microalbuminuria - switching from Nexium to [place] for GERD - Tussionex and Tessalon prn    Patient education and anticipatory guidance given Patient agrees with treatment plan Follow-up in 2 weeks w/PCP for cough or  sooner as needed if symptoms worsen or fail to improve  Darlyne Russian PA-C

## 2017-11-10 NOTE — Patient Instructions (Signed)
Stop Lisinopril and start Losartan (for blood pressure/kidneys) Continue Nexium until your insurance approves Dexilant, then switch to Dexilant for acid reflux/GERD Use Tussionex at bedtime as needed for cough (will cause drowsiness) Use Tessalon during the day as needed for cough

## 2017-11-30 ENCOUNTER — Ambulatory Visit: Payer: BLUE CROSS/BLUE SHIELD | Admitting: Physician Assistant

## 2017-11-30 ENCOUNTER — Encounter: Payer: Self-pay | Admitting: Physician Assistant

## 2017-11-30 VITALS — BP 128/72

## 2017-11-30 DIAGNOSIS — R05 Cough: Secondary | ICD-10-CM

## 2017-11-30 DIAGNOSIS — E1129 Type 2 diabetes mellitus with other diabetic kidney complication: Secondary | ICD-10-CM | POA: Diagnosis not present

## 2017-11-30 DIAGNOSIS — R809 Proteinuria, unspecified: Secondary | ICD-10-CM | POA: Diagnosis not present

## 2017-11-30 DIAGNOSIS — R053 Chronic cough: Secondary | ICD-10-CM

## 2017-11-30 DIAGNOSIS — I10 Essential (primary) hypertension: Secondary | ICD-10-CM

## 2017-11-30 LAB — POCT GLYCOSYLATED HEMOGLOBIN (HGB A1C): Hemoglobin A1C: 5.8 % — AB (ref 4.0–5.6)

## 2017-11-30 MED ORDER — GABAPENTIN 300 MG PO CAPS: ORAL_CAPSULE | ORAL | 2 refills | Status: DC

## 2017-11-30 NOTE — Patient Instructions (Addendum)
At this point with lots of testing and experimenting I think your cough is neurogenic in nature.   Start gabapentin.

## 2017-11-30 NOTE — Progress Notes (Signed)
Subjective:    Patient ID: Diane Brooks, female    DOB: 1955-06-05, 62 y.o.   MRN: [id number]  HPI  Pt is 62 yo female with PMH of GERD/hiatal hernia, HTN, T2DM who presents to the clinic with persistent cough. At this time cough has been going on for at least 3 months. At last visit she was taken off ACE thinking cough could be ACE cough. She is on losaartan. She does not feel like cough has improved. She has been to ENT but they released her and did not find any reason for cough. No fever, chills, SOB, wheezing.   DM- she is usually around 100's has had a few readings in the 60's. No open sores or wounds. Taking medication daily.   Being treated for GERD with carafate/PPI.   HTN- on cozaar. Denies any side effects. No CP, palpitations, headaches or vision changes.   .. Active Ambulatory Problems    Diagnosis Date Noted  . Hyperlipidemia 03/28/2013  . Hiatal hernia 03/28/2013  . Essential hypertension, benign 03/28/2013  . Diabetes mellitus type II, controlled ([place]) 03/28/2013  . GERD (gastroesophageal reflux disease) 05/10/2013  . Globus sensation 05/10/2013  . Atypical chest pain 05/10/2013  . Internal hemorrhoids 05/10/2013  . Decreased libido 05/10/2013  . High risk HPV infection 05/24/2013  . Colon cancer screening 05/25/2013  . Obesity (BMI 30-39.9) 06/27/2013  . Diabetes mellitus ([place]) 06/27/2013  . Abnormal weight gain 06/27/2013  . Hyperlipemia 06/27/2013  . Cyst, eyelid sebaceous 06/27/2013  . Onychomycosis 06/27/2013  . 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 Sudoriferous cyst 07/17/2013  . Seasonal allergies 08/03/2013  . Allergic rhinitis 08/03/2013  . Tinea versicolor 08/03/2013  . Obesity, unspecified 10/29/2013  . Hot flashes 10/29/2013  . Abdominal pain, epigastric 11/09/2013  . Migraine headache without aura 10/16/2015  . Mid back pain 10/24/2015  . Left-sided low back pain with left-sided sciatica 10/24/2015  . Neck pain 10/24/2015  . De Quervain's  disease (radial styloid tenosynovitis) 11/14/2015  . BPV (benign positional vertigo) 11/14/2015  . Muscle spasm of back 11/14/2015  . Polyarthralgia 11/19/2015  . Mouth sores 12/15/2015  . Black stools 12/15/2015  . Light stools 03/03/2016  . Systolic murmur 46/65/9935  . Low serum vitamin B12 04/20/2017  . Memory changes 04/24/2017  . Itching 06/13/2017  . Anxiety 06/13/2017  . Lipoma of left lower extremity 06/13/2017  . Reactive airway disease 07/03/2017  . Persistent cough for 3 weeks or longer 07/03/2017  . Shortness of breath 07/07/2017  . Myofascial pain syndrome 07/14/2017  . Vitamin D deficiency 09/12/2017  . Visit for screening mammogram 09/12/2017  . Microalbuminuria 09/13/2017   Resolved Ambulatory Problems    Diagnosis Date Noted  . No Resolved Ambulatory Problems   Past Medical History:  Diagnosis Date  . Diabetes mellitus without complication ([place])   . Hypertension        Review of Systems  All other systems reviewed and are negative.      Objective:   Physical Exam  Constitutional: She is oriented to person, place, and time. She appears well-developed and well-nourished.  HENT:  Head: Normocephalic and atraumatic.  Right Ear: External ear normal.  Left Ear: External ear normal.  Nose: Nose normal.  Mouth/Throat: Oropharynx is clear and moist.  Eyes: Pupils are equal, round, and reactive to light. Conjunctivae and EOM are normal.  Cardiovascular: Normal rate and regular rhythm.  Murmur heard. Pulmonary/Chest: Effort normal and breath sounds normal.  Neurological: She is alert and oriented  to person, place, and time.  Psychiatric: She has a normal mood and affect. Her behavior is normal.          Assessment & Plan:  [name] KitchenMarland KitchenDiagnoses and all orders for this visit:  Persistent cough for 3 weeks or longer -     gabapentin (NEURONTIN) 300 MG capsule; Take one tablet for 5 days, then increase to one tablet twice a day for 5 days then increase to one  tablet three times a day.  Diabetes mellitus with microalbuminuria (HCC) -     POCT glycosylated hemoglobin (Hb A1C)  Essential hypertension, benign     .[name] Kitchen Results for orders placed or performed in visit on 11/30/17  POCT glycosylated hemoglobin (Hb A1C)  Result Value Ref Range   Hemoglobin A1C 5.8 (A) 4.0 - 5.6 %   HbA1c POC (<> result, manual entry)     HbA1c, POC (prediabetic range)     HbA1c, POC (controlled diabetic range)     A!C looks great.  Continue current treatment plan.  On ARB.BP looks great.  On STATIN.  Need to call for eye exam at my eye doctor.  She would like to hold for now on flu shot.   Would like to treat for neurogenic cough. She is on treatment for allergies/asthma/GERD with no relief. Follow up in 3 months.   BP controlled. Will send paperwork to dentist to approve for procedure.

## 2017-12-04 ENCOUNTER — Telehealth: Payer: Self-pay | Admitting: Physician Assistant

## 2017-12-04 NOTE — Telephone Encounter (Signed)
My eye doctor need DM eye exam.

## 2017-12-04 NOTE — Telephone Encounter (Signed)
Remind me to look for paperwork to send clearance to [place] for dental procedure.

## 2017-12-06 ENCOUNTER — Encounter: Payer: Self-pay | Admitting: Physician Assistant

## 2017-12-13 ENCOUNTER — Encounter: Payer: Self-pay | Admitting: Physician Assistant

## 2017-12-13 ENCOUNTER — Ambulatory Visit: Payer: BLUE CROSS/BLUE SHIELD | Admitting: Physician Assistant

## 2017-12-13 VITALS — BP 139/64 | HR 68 | Ht 62.0 in | Wt 173.0 lb

## 2017-12-13 DIAGNOSIS — E1159 Type 2 diabetes mellitus with other circulatory complications: Secondary | ICD-10-CM | POA: Diagnosis not present

## 2017-12-13 DIAGNOSIS — E1129 Type 2 diabetes mellitus with other diabetic kidney complication: Secondary | ICD-10-CM | POA: Diagnosis not present

## 2017-12-13 DIAGNOSIS — I10 Essential (primary) hypertension: Secondary | ICD-10-CM

## 2017-12-13 DIAGNOSIS — R05 Cough: Secondary | ICD-10-CM

## 2017-12-13 DIAGNOSIS — I152 Hypertension secondary to endocrine disorders: Secondary | ICD-10-CM

## 2017-12-13 DIAGNOSIS — R809 Proteinuria, unspecified: Secondary | ICD-10-CM

## 2017-12-13 DIAGNOSIS — R059 Cough, unspecified: Secondary | ICD-10-CM

## 2017-12-13 MED ORDER — LOSARTAN POTASSIUM 25 MG PO TABS: 25.0000 mg | ORAL_TABLET | Freq: Every day | ORAL | 0 refills | Status: DC

## 2017-12-13 MED ORDER — HYDROCHLOROTHIAZIDE 25 MG PO TABS: 25.0000 mg | ORAL_TABLET | Freq: Every day | ORAL | 1 refills | Status: DC

## 2017-12-13 NOTE — Patient Instructions (Addendum)
Make sure you are taking cozaar with HCTZ and atenolol.  Continue gabapentin for cough. Give this 2-3 months.

## 2017-12-19 ENCOUNTER — Encounter: Payer: Self-pay | Admitting: Physician Assistant

## 2017-12-19 NOTE — Progress Notes (Signed)
Subjective:    Patient ID: Diane Brooks, female    DOB: [birth year], 62 y.o.   MRN: [id number]  HPI Pt is a 62 yo female with T2DM, HTN, GERD, anxiety who presents to the clinic to go over medications.   Pt was a little confused at what exactly she should be taking. She brings in her med list to go through together.   She continues to have cough but states it has improved some. No fever, chills, headache.   .. Active Ambulatory Problems    Diagnosis Date Noted  . Hyperlipidemia 03/28/2013  . Hiatal hernia 03/28/2013  . Essential hypertension, benign 03/28/2013  . Diabetes mellitus type II, controlled ([place]) 03/28/2013  . GERD (gastroesophageal reflux disease) 05/10/2013  . Globus sensation 05/10/2013  . Atypical chest pain 05/10/2013  . Internal hemorrhoids 05/10/2013  . Decreased libido 05/10/2013  . High risk HPV infection 05/24/2013  . Colon cancer screening 05/25/2013  . Obesity (BMI 30-39.9) 06/27/2013  . Diabetes mellitus ([place]) 06/27/2013  . Abnormal weight gain 06/27/2013  . Hyperlipemia 06/27/2013  . Cyst, eyelid sebaceous 06/27/2013  . Onychomycosis 06/27/2013  . 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 Sudoriferous cyst 07/17/2013  . Seasonal allergies 08/03/2013  . Allergic rhinitis 08/03/2013  . Tinea versicolor 08/03/2013  . Obesity, unspecified 10/29/2013  . Hot flashes 10/29/2013  . Abdominal pain, epigastric 11/09/2013  . Migraine headache without aura 10/16/2015  . Mid back pain 10/24/2015  . Left-sided low back pain with left-sided sciatica 10/24/2015  . Neck pain 10/24/2015  . De Quervain's disease (radial styloid tenosynovitis) 11/14/2015  . BPV (benign positional vertigo) 11/14/2015  . Muscle spasm of back 11/14/2015  . Polyarthralgia 11/19/2015  . Mouth sores 12/15/2015  . Black stools 12/15/2015  . Light stools 03/03/2016  . Systolic murmur 48/54/6270  . Low serum vitamin B12 04/20/2017  . Memory changes 04/24/2017  . Itching 06/13/2017  .  Anxiety 06/13/2017  . Lipoma of left lower extremity 06/13/2017  . Reactive airway disease 07/03/2017  . Persistent cough for 3 weeks or longer 07/03/2017  . Shortness of breath 07/07/2017  . Myofascial pain syndrome 07/14/2017  . Vitamin D deficiency 09/12/2017  . Visit for screening mammogram 09/12/2017  . Microalbuminuria 09/13/2017   Resolved Ambulatory Problems    Diagnosis Date Noted  . No Resolved Ambulatory Problems   Past Medical History:  Diagnosis Date  . Diabetes mellitus without complication ([place])   . Hypertension       Review of Systems  All other systems reviewed and are negative.      Objective:   Physical Exam  Constitutional: She is oriented to person, place, and time. She appears well-developed and well-nourished.  HENT:  Head: Normocephalic and atraumatic.  Neck: Normal range of motion. Neck supple.  Cardiovascular: Normal rate and regular rhythm.  Murmur heard. Pulmonary/Chest: Effort normal and breath sounds normal.  Lymphadenopathy:    She has no cervical adenopathy.  Neurological: She is alert and oriented to person, place, and time.  Psychiatric: She has a normal mood and affect. Her behavior is normal.          Assessment & Plan:  [name] KitchenMarland KitchenDiagnoses and all orders for this visit:  Cough  Diabetes mellitus with microalbuminuria (HCC) -     losartan (COZAAR) 25 MG tablet; Take 1 tablet (25 mg total) by mouth daily.  Hypertension associated with diabetes ([place]) -     losartan (COZAAR) 25 MG tablet; Take 1 tablet (25 mg total) by mouth daily.  Essential hypertension, benign -     hydrochlorothiazide (HYDRODIURIL) 25 MG tablet; Take 1 tablet (25 mg total) by mouth daily. -     losartan (COZAAR) 25 MG tablet; Take 1 tablet (25 mg total) by mouth daily.   Discussed why she was not on lisinopril(possible cough cause?) and on cozaar. Refilled cozaar and HCTZ. BP is controlled today. Continue on atenolol.   Discussed cough. Hopefully gabapentin  is improving cough. Continue on taper up and follow up in 2 months .   Ok to have dental procedure. Sent ok to dentists in [place] where she is having procedure.

## 2017-12-31 ENCOUNTER — Other Ambulatory Visit: Payer: Self-pay | Admitting: Physician Assistant

## 2017-12-31 ENCOUNTER — Other Ambulatory Visit: Payer: Self-pay | Admitting: Sports Medicine

## 2017-12-31 DIAGNOSIS — K449 Diaphragmatic hernia without obstruction or gangrene: Secondary | ICD-10-CM

## 2017-12-31 DIAGNOSIS — E119 Type 2 diabetes mellitus without complications: Secondary | ICD-10-CM

## 2018-01-01 NOTE — Telephone Encounter (Signed)
To PCP

## 2018-01-11 ENCOUNTER — Other Ambulatory Visit: Payer: Self-pay | Admitting: Physician Assistant

## 2018-01-11 ENCOUNTER — Telehealth: Payer: Self-pay

## 2018-01-11 DIAGNOSIS — R809 Proteinuria, unspecified: Principal | ICD-10-CM

## 2018-01-11 DIAGNOSIS — E1159 Type 2 diabetes mellitus with other circulatory complications: Secondary | ICD-10-CM

## 2018-01-11 DIAGNOSIS — I10 Essential (primary) hypertension: Secondary | ICD-10-CM

## 2018-01-11 DIAGNOSIS — I152 Hypertension secondary to endocrine disorders: Secondary | ICD-10-CM

## 2018-01-11 DIAGNOSIS — E1129 Type 2 diabetes mellitus with other diabetic kidney complication: Secondary | ICD-10-CM

## 2018-01-11 MED ORDER — ATENOLOL 25 MG PO TABS: 25.0000 mg | ORAL_TABLET | Freq: Every day | ORAL | 0 refills | Status: DC

## 2018-01-11 NOTE — Telephone Encounter (Signed)
RX sent, pt advised

## 2018-01-11 NOTE — Telephone Encounter (Signed)
Yes

## 2018-01-11 NOTE — Telephone Encounter (Signed)
Pt called requesting a refill be sent on her Atenolol 25mg .  Med list shows the last time this was filled was 2017. Pt states on the phone that she has been on the Atenolol 25mg  for years.    OK to send this RF?

## 2018-03-01 ENCOUNTER — Ambulatory Visit: Payer: BLUE CROSS/BLUE SHIELD | Admitting: Physician Assistant

## 2018-03-07 ENCOUNTER — Ambulatory Visit: Payer: BLUE CROSS/BLUE SHIELD | Admitting: Physician Assistant

## 2018-03-07 ENCOUNTER — Encounter: Payer: Self-pay | Admitting: Physician Assistant

## 2018-03-07 VITALS — BP 114/61 | HR 70 | Ht 62.0 in | Wt 169.0 lb

## 2018-03-07 DIAGNOSIS — M545 Low back pain, unspecified: Secondary | ICD-10-CM

## 2018-03-07 DIAGNOSIS — I10 Essential (primary) hypertension: Secondary | ICD-10-CM

## 2018-03-07 DIAGNOSIS — Z13220 Encounter for screening for lipoid disorders: Secondary | ICD-10-CM

## 2018-03-07 DIAGNOSIS — K219 Gastro-esophageal reflux disease without esophagitis: Secondary | ICD-10-CM

## 2018-03-07 DIAGNOSIS — E1159 Type 2 diabetes mellitus with other circulatory complications: Secondary | ICD-10-CM | POA: Diagnosis not present

## 2018-03-07 DIAGNOSIS — R809 Proteinuria, unspecified: Secondary | ICD-10-CM

## 2018-03-07 DIAGNOSIS — I152 Hypertension secondary to endocrine disorders: Secondary | ICD-10-CM | POA: Insufficient documentation

## 2018-03-07 DIAGNOSIS — E119 Type 2 diabetes mellitus without complications: Secondary | ICD-10-CM

## 2018-03-07 DIAGNOSIS — E1129 Type 2 diabetes mellitus with other diabetic kidney complication: Secondary | ICD-10-CM

## 2018-03-07 DIAGNOSIS — G8929 Other chronic pain: Secondary | ICD-10-CM

## 2018-03-07 LAB — POCT GLYCOSYLATED HEMOGLOBIN (HGB A1C): Hemoglobin A1C: 5.8 % — AB (ref 4.0–5.6)

## 2018-03-07 MED ORDER — LOSARTAN POTASSIUM 25 MG PO TABS: 25.0000 mg | ORAL_TABLET | Freq: Every day | ORAL | 0 refills | Status: DC

## 2018-03-07 MED ORDER — MELOXICAM 15 MG PO TABS: 15.0000 mg | ORAL_TABLET | Freq: Every day | ORAL | 2 refills | Status: DC

## 2018-03-07 MED ORDER — HYDROCHLOROTHIAZIDE 25 MG PO TABS: 25.0000 mg | ORAL_TABLET | Freq: Every day | ORAL | 1 refills | Status: DC

## 2018-03-07 MED ORDER — METFORMIN HCL 1000 MG PO TABS: 1000.0000 mg | ORAL_TABLET | Freq: Two times a day (BID) | ORAL | 0 refills | Status: DC

## 2018-03-07 MED ORDER — SAXAGLIPTIN HCL 2.5 MG PO TABS: 2.5000 mg | ORAL_TABLET | Freq: Every day | ORAL | 1 refills | Status: DC

## 2018-03-07 MED ORDER — ATENOLOL 25 MG PO TABS: 25.0000 mg | ORAL_TABLET | Freq: Every day | ORAL | 0 refills | Status: DC

## 2018-03-07 MED ORDER — DEXLANSOPRAZOLE 60 MG PO CPDR: 60.0000 mg | DELAYED_RELEASE_CAPSULE | Freq: Every day | ORAL | 0 refills | Status: DC

## 2018-03-07 MED ORDER — CYCLOBENZAPRINE HCL 10 MG PO TABS: 10.0000 mg | ORAL_TABLET | Freq: Three times a day (TID) | ORAL | 0 refills | Status: DC | PRN

## 2018-03-07 NOTE — Progress Notes (Signed)
Subjective:    Patient ID: Diane Brooks, female    DOB: [birth year], 62 y.o.   MRN: [id number]  HPI Pt is a 62 yo female with HTN, T2DM who presents to the clinic for 3 month follow up.   DM- checking fasting sugars and running 80's to 90s. Denies any hypoglycemia. No open sores or wounds. Taking medication daily.   HTN- denies any CP, palpitations, SOB. Taking medications daily.   She does mention some ongoing intermittent low back pain mostly left that radiates down left leg. She notices it more after long car rides. She has had this ever since pregnancy. She would like something to take as needed. No pain today. Denies any bowel or bladder dysfunction. Denies any saddle anesthesia.   [name] Kitchen. Active Ambulatory Problems    Diagnosis Date Noted  . Hyperlipidemia 03/28/2013  . Hiatal hernia 03/28/2013  . Essential hypertension, benign 03/28/2013  . Diabetes mellitus type II, controlled ([place]) 03/28/2013  . GERD (gastroesophageal reflux disease) 05/10/2013  . Globus sensation 05/10/2013  . Atypical chest pain 05/10/2013  . Internal hemorrhoids 05/10/2013  . Decreased libido 05/10/2013  . High risk HPV infection 05/24/2013  . Colon cancer screening 05/25/2013  . Obesity (BMI 30-39.9) 06/27/2013  . Diabetes mellitus with microalbuminuria ([place]) 06/27/2013  . Abnormal weight gain 06/27/2013  . Hyperlipemia 06/27/2013  . Cyst, eyelid sebaceous 06/27/2013  . Onychomycosis 06/27/2013  . 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 Sudoriferous cyst 07/17/2013  . Seasonal allergies 08/03/2013  . Allergic rhinitis 08/03/2013  . Tinea versicolor 08/03/2013  . Obesity, unspecified 10/29/2013  . Hot flashes 10/29/2013  . Abdominal pain, epigastric 11/09/2013  . Migraine headache without aura 10/16/2015  . Mid back pain 10/24/2015  . Left-sided low back pain with left-sided sciatica 10/24/2015  . Neck pain 10/24/2015  . De Quervain's disease (radial styloid tenosynovitis) 11/14/2015  . BPV (benign  positional vertigo) 11/14/2015  . Muscle spasm of back 11/14/2015  . Polyarthralgia 11/19/2015  . Mouth sores 12/15/2015  . Black stools 12/15/2015  . Light stools 03/03/2016  . Systolic murmur 43/15/4008  . Low serum vitamin B12 04/20/2017  . Memory changes 04/24/2017  . Itching 06/13/2017  . Anxiety 06/13/2017  . Lipoma of left lower extremity 06/13/2017  . Reactive airway disease 07/03/2017  . Persistent cough for 3 weeks or longer 07/03/2017  . Shortness of breath 07/07/2017  . Myofascial pain syndrome 07/14/2017  . Vitamin D deficiency 09/12/2017  . Visit for screening mammogram 09/12/2017  . Microalbuminuria 09/13/2017  . Hypertension associated with diabetes ([place]) 03/07/2018   Resolved Ambulatory Problems    Diagnosis Date Noted  . No Resolved Ambulatory Problems   Past Medical History:  Diagnosis Date  . Diabetes mellitus without complication ([place])   . Hypertension       Review of Systems  All other systems reviewed and are negative.      Objective:   Physical Exam Constitutional:      Appearance: Normal appearance.  HENT:     Head: Normocephalic and atraumatic.  Cardiovascular:     Rate and Rhythm: Normal rate and regular rhythm.  Pulmonary:     Effort: Pulmonary effort is normal.     Breath sounds: Normal breath sounds.  Musculoskeletal:     Comments: Tenderness just to the left of lumbar spine over left SI joint and into center of buttocks.  Normal ROM of hips.  Negative straight leg raise.   Neurological:     General: No focal deficit present.  Mental Status: She is alert and oriented to person, place, and time.  Psychiatric:        Mood and Affect: Mood normal.        Behavior: Behavior normal.           Assessment & Plan:  [name] KitchenMarland KitchenSerai was seen today for follow-up.  Diagnoses and all orders for this visit:  Diabetes mellitus with microalbuminuria ([place]) -     POCT glycosylated hemoglobin (Hb A1C) -     losartan (COZAAR) 25 MG tablet;  Take 1 tablet (25 mg total) by mouth daily.  Essential hypertension, benign -     atenolol (TENORMIN) 25 MG tablet; Take 1 tablet (25 mg total) by mouth daily. -     losartan (COZAAR) 25 MG tablet; Take 1 tablet (25 mg total) by mouth daily. -     COMPLETE METABOLIC PANEL WITH GFR -     hydrochlorothiazide (HYDRODIURIL) 25 MG tablet; Take 1 tablet (25 mg total) by mouth daily.  Gastroesophageal reflux disease without esophagitis -     dexlansoprazole (DEXILANT) 60 MG capsule; Take 1 capsule (60 mg total) by mouth daily.  Hypertension associated with diabetes ([place]) -     losartan (COZAAR) 25 MG tablet; Take 1 tablet (25 mg total) by mouth daily. -     COMPLETE METABOLIC PANEL WITH GFR  Controlled type 2 diabetes mellitus without complication, without long-term current use of insulin (HCC) -     metFORMIN (GLUCOPHAGE) 1000 MG tablet; Take 1 tablet (1,000 mg total) by mouth 2 (two) times daily with a meal. -     saxagliptin HCl (ONGLYZA) 2.5 MG TABS tablet; Take 1 tablet (2.5 mg total) by mouth daily. -     Lipid Panel w/reflex Direct LDL  Screening for lipid disorders -     Lipid Panel w/reflex Direct LDL  Chronic left-sided low back pain without sciatica -     meloxicam (MOBIC) 15 MG tablet; Take 1 tablet (15 mg total) by mouth daily. As needed for low back pain. -     cyclobenzaprine (FLEXERIL) 10 MG tablet; Take 1 tablet (10 mg total) by mouth 3 (three) times daily as needed for muscle spasms. For low back pain.   .. Results for orders placed or performed in visit on 03/07/18  POCT glycosylated hemoglobin (Hb A1C)  Result Value Ref Range   Hemoglobin A1C 5.8 (A) 4.0 - 5.6 %   HbA1c POC (<> result, manual entry)     HbA1c, POC (prediabetic range)     HbA1c, POC (controlled diabetic range)     A!C looks great.  Continue on same medications.  BP controlled refilled atenolol and cozaar.   Needs eye exam report. Per patient got at my eye doctor [facility]. Will request records.    Symptoms and exam appear like SI joint dysfunction. Exercises given. Suggested to use tennis ball in car and for therapy. NSAID as needed. Muscle relaxer as needed. Massage therapy. Follow up as needed.

## 2018-03-07 NOTE — Patient Instructions (Signed)
Sacroiliac Joint Dysfunction Sacroiliac joint dysfunction is a condition that causes inflammation on one or both sides of the sacroiliac (SI) joint. The SI joint connects the lower part of the spine (sacrum) with the two upper portions of the pelvis (ilium). This condition causes deep aching or burning pain in the low back. In some cases, the pain may also spread into one or both buttocks or hips or spread down the legs. What are the causes? This condition may be caused by:  Pregnancy. During pregnancy, extra stress is put on the SI joints because the pelvis widens.  Injury, such as: ? Car accidents. ? Sport-related injuries. ? Work-related injuries.  Having one leg that is shorter than the other.  Conditions that affect the joints, such as: ? Rheumatoid arthritis. ? Gout. ? Psoriatic arthritis. ? Joint infection (septic arthritis).  Sometimes, the cause of SI joint dysfunction is not known. What are the signs or symptoms? Symptoms of this condition include:  Aching or burning pain in the lower back. The pain may also spread to other areas, such as: ? Buttocks. ? Groin. ? Thighs and legs.  Muscle spasms in or around the painful areas.  Increased pain when standing, walking, running, stair climbing, bending, or lifting.  How is this diagnosed? Your health care provider will do a physical exam and take your medical history. During the exam, the health care provider may move one or both of your legs to different positions to check for pain. Various tests may be done to help verify the diagnosis, including:  Imaging tests to look for other causes of pain. These may include: ? MRI. ? CT scan. ? Bone scan.  Diagnostic injection. A numbing medicine is injected into the SI joint using a needle. If the pain is temporarily improved or stopped after the injection, this can indicate that SI joint dysfunction is the problem.  How is this treated? Treatment may vary depending on the  cause and severity of your condition. Treatment options may include:  Applying ice or heat to the lower back area. This can help to reduce pain and muscle spasms.  Medicines to relieve pain or inflammation or to relax the muscles.  Wearing a back brace (sacroiliac brace) to help support the joint while your back is healing.  Physical therapy to increase muscle strength around the joint and flexibility at the joint. This may also involve learning proper body positions and ways of moving to relieve stress on the joint.  Direct manipulation of the SI joint.  Injections of steroid medicine into the joint in order to reduce pain and swelling.  Radiofrequency ablation to burn away nerves that are carrying pain messages from the joint.  Use of a device that provides electrical stimulation in order to reduce pain at the joint.  Surgery to put in screws and plates that limit or prevent joint motion. This is rare.  Follow these instructions at home:  Rest as needed. Limit your activities as directed by your health care provider.  Take medicines only as directed by your health care provider.  If directed, apply ice to the affected area: ? Put ice in a plastic bag. ? Place a towel between your skin and the bag. ? Leave the ice on for 20 minutes, 2-3 times per day.  Use a heating pad or a moist heat pack as directed by your health care provider.  Exercise as directed by your health care provider or physical therapist.  Keep all follow-up visits   as directed by your health care provider. This is important. Contact a health care provider if:  Your pain is not controlled with medicine.  You have a fever.  You have increasingly severe pain. Get help right away if:  You have weakness, numbness, or tingling in your legs or feet.  You lose control of your bladder or bowel. This information is not intended to replace advice given to you by your health care provider. Make sure you discuss  any questions you have with your health care provider. Document Released: 06/04/2008 Document Revised: 08/14/2015 Document Reviewed: 11/13/2013 Elsevier Interactive Patient Education  2018 Elsevier Inc.  

## 2018-03-09 DIAGNOSIS — G8929 Other chronic pain: Secondary | ICD-10-CM | POA: Insufficient documentation

## 2018-03-09 DIAGNOSIS — M545 Low back pain: Secondary | ICD-10-CM

## 2018-03-10 ENCOUNTER — Other Ambulatory Visit: Payer: Self-pay | Admitting: Physician Assistant

## 2018-03-10 DIAGNOSIS — I10 Essential (primary) hypertension: Secondary | ICD-10-CM

## 2018-03-10 DIAGNOSIS — E119 Type 2 diabetes mellitus without complications: Secondary | ICD-10-CM

## 2018-03-10 LAB — COMPLETE METABOLIC PANEL WITH GFR
AG RATIO: 1.8 (calc) (ref 1.0–2.5)
ALT: 14 U/L (ref 6–29)
AST: 17 U/L (ref 10–35)
Albumin: 4.3 g/dL (ref 3.6–5.1)
Alkaline phosphatase (APISO): 46 U/L (ref 33–130)
BILIRUBIN TOTAL: 0.5 mg/dL (ref 0.2–1.2)
BUN: 18 mg/dL (ref 7–25)
CHLORIDE: 102 mmol/L (ref 98–110)
CO2: 30 mmol/L (ref 20–32)
Calcium: 9.7 mg/dL (ref 8.6–10.4)
Creat: 0.81 mg/dL (ref 0.50–0.99)
GFR, Est African American: 91 mL/min/{1.73_m2} (ref >=60)
GFR, Est Non African American: 79 mL/min/{1.73_m2} (ref >=60)
Globulin: 2.4 g/dL (calc) (ref 1.9–3.7)
Glucose, Bld: 110 mg/dL — ABNORMAL HIGH (ref 65–99)
Potassium: 4.6 mmol/L (ref 3.5–5.3)
SODIUM: 140 mmol/L (ref 135–146)
Total Protein: 6.7 g/dL (ref 6.1–8.1)

## 2018-03-10 LAB — LIPID PANEL W/REFLEX DIRECT LDL
Cholesterol: 223 mg/dL — ABNORMAL HIGH (ref <200)
HDL: 70 mg/dL (ref >50)
LDL Cholesterol (Calc): 133 mg/dL (calc) — ABNORMAL HIGH
Non-HDL Cholesterol (Calc): 153 mg/dL (calc) — ABNORMAL HIGH (ref <130)
TRIGLYCERIDES: 97 mg/dL (ref <150)
Total CHOL/HDL Ratio: 3.2 (calc) (ref <5.0)

## 2018-03-10 MED ORDER — ATORVASTATIN CALCIUM 20 MG PO TABS: 20.0000 mg | ORAL_TABLET | Freq: Every day | ORAL | 3 refills | Status: DC

## 2018-03-10 MED ORDER — SAXAGLIPTIN HCL 2.5 MG PO TABS: 2.5000 mg | ORAL_TABLET | Freq: Every day | ORAL | 1 refills | Status: DC

## 2018-03-10 MED ORDER — CYCLOBENZAPRINE HCL 10 MG PO TABS: 10.0000 mg | ORAL_TABLET | Freq: Three times a day (TID) | ORAL | 0 refills | Status: DC | PRN

## 2018-03-10 NOTE — Progress Notes (Signed)
Sent!

## 2018-03-10 NOTE — Addendum Note (Signed)
Addended by: Towana Badger on: 03/10/2018 09:39 AM   Modules accepted: Orders

## 2018-03-10 NOTE — Addendum Note (Signed)
Addended by: Alena Bills R on: 03/10/2018 02:51 PM   Modules accepted: Orders

## 2018-03-10 NOTE — Addendum Note (Signed)
Addended by: Donella Stade on: 03/10/2018 11:48 AM   Modules accepted: Orders

## 2018-03-10 NOTE — Progress Notes (Signed)
Call pt: HDL looks wonderful but LDL is not to goal. We need to add a statin to lower your CV risk and get LDL under 70. Did you not have some muscle pain with statins in the past?

## 2018-03-20 ENCOUNTER — Ambulatory Visit: Payer: BLUE CROSS/BLUE SHIELD | Admitting: Physician Assistant

## 2018-03-20 ENCOUNTER — Encounter: Payer: Self-pay | Admitting: Physician Assistant

## 2018-03-20 ENCOUNTER — Telehealth: Payer: Self-pay | Admitting: Physician Assistant

## 2018-03-20 VITALS — BP 136/71 | HR 60 | Ht 62.0 in | Wt 166.0 lb

## 2018-03-20 DIAGNOSIS — L299 Pruritus, unspecified: Secondary | ICD-10-CM

## 2018-03-20 MED ORDER — TRIAMCINOLONE 0.1 % CREAM:EUCERIN CREAM 1:1: 1.0000 "application " | TOPICAL_CREAM | Freq: Two times a day (BID) | CUTANEOUS | 1 refills | Status: DC | PRN

## 2018-03-20 NOTE — Telephone Encounter (Signed)
Disregard routing to [facility], she has been advised.

## 2018-03-20 NOTE — Progress Notes (Signed)
Subjective:    Patient ID: Diane Brooks, female    DOB: [birth year], 62 y.o.   MRN: [id number]  HPI  Pt is a 62 yo female who presents to the clinic with itching mostly on her back for a few weeks. She did start lipitor recently but itching started before that. She does not notice any rash. It spreads over her enitre back. She denies any travel, no new soaps, no new detergents or bath bombs. No SOB, difficultly breathing. Tried some soothing creams with some relief. Seems to be getting some better.   .. Active Ambulatory Problems    Diagnosis Date Noted  . Hyperlipidemia 03/28/2013  . Hiatal hernia 03/28/2013  . Essential hypertension, benign 03/28/2013  . Diabetes mellitus type II, controlled ([place]) 03/28/2013  . GERD (gastroesophageal reflux disease) 05/10/2013  . Globus sensation 05/10/2013  . Atypical chest pain 05/10/2013  . Internal hemorrhoids 05/10/2013  . Decreased libido 05/10/2013  . High risk HPV infection 05/24/2013  . Colon cancer screening 05/25/2013  . Obesity (BMI 30-39.9) 06/27/2013  . Diabetes mellitus with microalbuminuria ([place]) 06/27/2013  . Abnormal weight gain 06/27/2013  . Hyperlipemia 06/27/2013  . Cyst, eyelid sebaceous 06/27/2013  . Onychomycosis 06/27/2013  . 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 Sudoriferous cyst 07/17/2013  . Seasonal allergies 08/03/2013  . Allergic rhinitis 08/03/2013  . Tinea versicolor 08/03/2013  . Obesity, unspecified 10/29/2013  . Hot flashes 10/29/2013  . Abdominal pain, epigastric 11/09/2013  . Migraine headache without aura 10/16/2015  . Mid back pain 10/24/2015  . Left-sided low back pain with left-sided sciatica 10/24/2015  . Neck pain 10/24/2015  . De Quervain's disease (radial styloid tenosynovitis) 11/14/2015  . BPV (benign positional vertigo) 11/14/2015  . Muscle spasm of back 11/14/2015  . Polyarthralgia 11/19/2015  . Mouth sores 12/15/2015  . Black stools 12/15/2015  . Light stools 03/03/2016  . Systolic  murmur 78/29/5621  . Low serum vitamin B12 04/20/2017  . Memory changes 04/24/2017  . Itching 06/13/2017  . Anxiety 06/13/2017  . Lipoma of left lower extremity 06/13/2017  . Reactive airway disease 07/03/2017  . Persistent cough for 3 weeks or longer 07/03/2017  . Shortness of breath 07/07/2017  . Myofascial pain syndrome 07/14/2017  . Vitamin D deficiency 09/12/2017  . Visit for screening mammogram 09/12/2017  . Microalbuminuria 09/13/2017  . Hypertension associated with diabetes ([place]) 03/07/2018  . Chronic left-sided low back pain without sciatica 03/09/2018   Resolved Ambulatory Problems    Diagnosis Date Noted  . No Resolved Ambulatory Problems   Past Medical History:  Diagnosis Date  . Diabetes mellitus without complication ([place])   . Hypertension       Review of Systems    see HPI>  Objective:   Physical Exam Vitals signs reviewed.  Constitutional:      Appearance: Normal appearance.  HENT:     Head: Normocephalic and atraumatic.  Cardiovascular:     Rate and Rhythm: Normal rate and regular rhythm.  Pulmonary:     Effort: Pulmonary effort is normal.     Breath sounds: Normal breath sounds.  Skin:    Comments: Entire back you can see excoriations without rash.   Neurological:     General: No focal deficit present.     Mental Status: She is alert and oriented to person, place, and time.           Assessment & Plan:  [name] KitchenMarland KitchenJodilyn was seen today for pruritis.  Diagnoses and all orders for this visit:  Itching -     Triamcinolone Acetonide (TRIAMCINOLONE 0.1 % CREAM : EUCERIN) CREA; Apply 1 application topically 2 (two) times daily as needed. 1 to 1 ratio.   Unclear etiology. Could be just some dry skin. Reassured patient very recent labs showed good kidney and liver function. Itching started before lipitor. Use topical steroid/lotion combination for a few days. Keep skin moisturized. Start zyrtec at bedtime for the next few weeks. Watch for any new  triggers that make worse. Call if not improving or worsening.

## 2018-03-20 NOTE — Telephone Encounter (Signed)
Pt is thinking of switching her insurance to [place] 800 plan. We are not showing up as active with this plan. Routing to see if we are in network with it, or if this is a [place] only plan.

## 2018-03-20 NOTE — Patient Instructions (Addendum)
Start zyrtec at night.  Use eucerin/trimacinolone cream for next week then transition to cetaphil/aveeno for moisture.

## 2018-03-21 NOTE — Telephone Encounter (Signed)
Checked in with our Engineer, building services regarding this. She is not sure about this plan but is going to reach out to a contact to see if she can get any additional information.

## 2018-03-23 ENCOUNTER — Encounter: Payer: Self-pay | Admitting: Physician Assistant

## 2018-05-15 ENCOUNTER — Telehealth: Payer: Self-pay | Admitting: Physician Assistant

## 2018-05-15 DIAGNOSIS — E119 Type 2 diabetes mellitus without complications: Secondary | ICD-10-CM

## 2018-05-15 NOTE — Telephone Encounter (Signed)
Pended. Will find out if pt wants them to go to local or mail order

## 2018-05-16 MED ORDER — GLUCOSE BLOOD VI STRP: ORAL_STRIP | 1 refills | Status: DC

## 2018-05-17 NOTE — Telephone Encounter (Signed)
Sent!

## 2018-05-22 ENCOUNTER — Telehealth: Payer: Self-pay | Admitting: Physician Assistant

## 2018-05-22 DIAGNOSIS — E1129 Type 2 diabetes mellitus with other diabetic kidney complication: Secondary | ICD-10-CM

## 2018-05-22 DIAGNOSIS — I10 Essential (primary) hypertension: Secondary | ICD-10-CM

## 2018-05-22 DIAGNOSIS — I152 Hypertension secondary to endocrine disorders: Secondary | ICD-10-CM

## 2018-05-22 DIAGNOSIS — E119 Type 2 diabetes mellitus without complications: Secondary | ICD-10-CM

## 2018-05-22 DIAGNOSIS — R809 Proteinuria, unspecified: Secondary | ICD-10-CM

## 2018-05-22 DIAGNOSIS — E1159 Type 2 diabetes mellitus with other circulatory complications: Secondary | ICD-10-CM

## 2018-05-22 MED ORDER — ATENOLOL 25 MG PO TABS: 25.0000 mg | ORAL_TABLET | Freq: Every day | ORAL | 1 refills | Status: DC

## 2018-05-22 MED ORDER — LOSARTAN POTASSIUM 25 MG PO TABS: 25.0000 mg | ORAL_TABLET | Freq: Every day | ORAL | 1 refills | Status: DC

## 2018-05-22 MED ORDER — METFORMIN HCL 1000 MG PO TABS: 1000.0000 mg | ORAL_TABLET | Freq: Two times a day (BID) | ORAL | 1 refills | Status: DC

## 2018-05-22 NOTE — Telephone Encounter (Signed)
Medications refilled

## 2018-06-02 ENCOUNTER — Other Ambulatory Visit: Payer: Self-pay | Admitting: Physician Assistant

## 2018-06-02 DIAGNOSIS — I10 Essential (primary) hypertension: Secondary | ICD-10-CM

## 2018-06-02 DIAGNOSIS — I152 Hypertension secondary to endocrine disorders: Secondary | ICD-10-CM

## 2018-06-02 DIAGNOSIS — E1159 Type 2 diabetes mellitus with other circulatory complications: Secondary | ICD-10-CM

## 2018-06-02 DIAGNOSIS — E119 Type 2 diabetes mellitus without complications: Secondary | ICD-10-CM

## 2018-06-02 DIAGNOSIS — R809 Proteinuria, unspecified: Secondary | ICD-10-CM

## 2018-06-02 DIAGNOSIS — E1129 Type 2 diabetes mellitus with other diabetic kidney complication: Secondary | ICD-10-CM

## 2018-06-02 MED ORDER — METFORMIN HCL 1000 MG PO TABS: 1000.0000 mg | ORAL_TABLET | Freq: Two times a day (BID) | ORAL | 1 refills | Status: DC

## 2018-06-02 MED ORDER — SAXAGLIPTIN HCL 2.5 MG PO TABS: 2.5000 mg | ORAL_TABLET | Freq: Every day | ORAL | 1 refills | Status: DC

## 2018-06-02 MED ORDER — ATORVASTATIN CALCIUM 20 MG PO TABS: 20.0000 mg | ORAL_TABLET | Freq: Every day | ORAL | 3 refills | Status: DC

## 2018-06-06 ENCOUNTER — Telehealth: Payer: Self-pay

## 2018-06-06 MED ORDER — HYDROXYZINE HCL 10 MG PO TABS: ORAL_TABLET | ORAL | 1 refills | Status: DC

## 2018-06-06 NOTE — Telephone Encounter (Signed)
Pt advised. Cancelled upcoming appt. Vistaril RX pended for provider to review and send to pharmacy.

## 2018-06-06 NOTE — Telephone Encounter (Addendum)
Pt called requesting to cancel her appt tomorrow due to COVID-19 scares.   States she is very anxious with all the sickness going around and wants to know if it is okay for:  1. Her to cancel her appt tomorrow and reschedule for later  2. Get some anxiety medications to help her nerves and anxiety while there is widespread panic (if okay for medication, please send to local [facility])  Please advise

## 2018-06-06 NOTE — Telephone Encounter (Signed)
Ok to post-pone appt. We are trying to get technology to do telephone visits. Does she need any refills?   We could give her some vistaril to use as needed for anxiety if she would like to try? Vistaril 10mg  1 po prn as needed up to 3 times a day #45 1 refill.

## 2018-06-07 ENCOUNTER — Ambulatory Visit: Payer: BLUE CROSS/BLUE SHIELD | Admitting: Physician Assistant

## 2018-06-13 ENCOUNTER — Other Ambulatory Visit: Payer: Self-pay

## 2018-06-13 ENCOUNTER — Encounter: Payer: Self-pay | Admitting: Physician Assistant

## 2018-06-13 ENCOUNTER — Telehealth (INDEPENDENT_AMBULATORY_CARE_PROVIDER_SITE_OTHER): Payer: BLUE CROSS/BLUE SHIELD | Admitting: Physician Assistant

## 2018-06-13 VITALS — Wt 168.9 lb

## 2018-06-13 DIAGNOSIS — E119 Type 2 diabetes mellitus without complications: Secondary | ICD-10-CM

## 2018-06-13 DIAGNOSIS — I10 Essential (primary) hypertension: Secondary | ICD-10-CM | POA: Diagnosis not present

## 2018-06-13 DIAGNOSIS — E782 Mixed hyperlipidemia: Secondary | ICD-10-CM

## 2018-06-13 DIAGNOSIS — G245 Blepharospasm: Secondary | ICD-10-CM

## 2018-06-13 DIAGNOSIS — F418 Other specified anxiety disorders: Secondary | ICD-10-CM

## 2018-06-13 NOTE — Progress Notes (Signed)
..Virtual Visit via Telephone Note  I connected with Diane Brooks on 06/13/18 at  9:50 AM EDT by telephone and verified that I am speaking with the correct person using two identifiers.   I discussed the limitations, risks, security and privacy concerns of performing an evaluation and management service by telephone and the availability of in person appointments. I also discussed with the patient that there may be a patient responsible charge related to this service. The patient expressed understanding and agreed to proceed.   History of Present Illness: Pt is a 63 yo female with T2DM, HTN who presents to the clinic for medication refills and to touch base on her recent worsening anxiety.   She was due for DM follow up. She did not come in due to [place] pandemic. Last A!C was  .[name] Kitchen Lab Results  Component Value Date   HGBA1C 5.8 (A) 03/07/2018  her refills were sent. Her sugars in the morning were 108, 99, 104, 119.  She is taking medication daily. No problems or concern.   HTN- no CP, palpitations.   She has notice more frequent headaches and twich of her left eye. She is under a lot of stress with her health. She becomes really emotional at times. She called in when she got overwhelmed and I sent vistaril which has really helped her. excedrin helps her headache.   .. Active Ambulatory Problems    Diagnosis Date Noted  . Hyperlipidemia 03/28/2013  . Hiatal hernia 03/28/2013  . Essential hypertension, benign 03/28/2013  . Diabetes mellitus type II, controlled ([place]) 03/28/2013  . GERD (gastroesophageal reflux disease) 05/10/2013  . Globus sensation 05/10/2013  . Atypical chest pain 05/10/2013  . Internal hemorrhoids 05/10/2013  . Decreased libido 05/10/2013  . High risk HPV infection 05/24/2013  . Colon cancer screening 05/25/2013  . Obesity (BMI 30-39.9) 06/27/2013  . Diabetes mellitus with microalbuminuria ([place]) 06/27/2013  . Abnormal weight gain 06/27/2013  . Hyperlipemia  06/27/2013  . Cyst, eyelid sebaceous 06/27/2013  . Onychomycosis 06/27/2013  . 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 Sudoriferous cyst 07/17/2013  . Seasonal allergies 08/03/2013  . Allergic rhinitis 08/03/2013  . Tinea versicolor 08/03/2013  . Obesity, unspecified 10/29/2013  . Hot flashes 10/29/2013  . Abdominal pain, epigastric 11/09/2013  . Migraine headache without aura 10/16/2015  . Mid back pain 10/24/2015  . Left-sided low back pain with left-sided sciatica 10/24/2015  . Neck pain 10/24/2015  . De Quervain's disease (radial styloid tenosynovitis) 11/14/2015  . BPV (benign positional vertigo) 11/14/2015  . Muscle spasm of back 11/14/2015  . Polyarthralgia 11/19/2015  . Mouth sores 12/15/2015  . Black stools 12/15/2015  . Light stools 03/03/2016  . Systolic murmur 49/44/9675  . Low serum vitamin B12 04/20/2017  . Memory changes 04/24/2017  . Itching 06/13/2017  . Anxiety 06/13/2017  . Lipoma of left lower extremity 06/13/2017  . Reactive airway disease 07/03/2017  . Persistent cough for 3 weeks or longer 07/03/2017  . Shortness of breath 07/07/2017  . Myofascial pain syndrome 07/14/2017  . Vitamin D deficiency 09/12/2017  . Visit for screening mammogram 09/12/2017  . Microalbuminuria 09/13/2017  . Hypertension associated with diabetes ([place]) 03/07/2018  . Chronic left-sided low back pain without sciatica 03/09/2018  . Blepharospasm 06/13/2018   Resolved Ambulatory Problems    Diagnosis Date Noted  . No Resolved Ambulatory Problems   Past Medical History:  Diagnosis Date  . Diabetes mellitus without complication ([place])   . Hypertension    Reviewed her med, allergy,  problem list.     Observations/Objective: No acute distress.  Pt did become tearful at some points talking about the covid epidemic.   .. Today's Vitals   06/13/18 0958  Weight: 168 lb 14.4 oz (76.6 kg)   Body mass index is 30.89 kg/m.    Assessment and Plan: [name] KitchenMarland KitchenDiagnoses and all orders for  this visit:  Controlled type 2 diabetes mellitus without complication, without long-term current use of insulin (HCC)  Mixed hyperlipidemia  Blepharospasm  Anxiety about health  Essential hypertension, benign   Medications have been refilled.  Follow up in 3 months.  Encouraged pt to get eye exam for DM.   Did a lot of reassurance about COVID outbreak and how she can prepare but not cause excessive worry. Use vistaril as needed. Discussed signs and symptoms and that most are mild cases. I think headaches and eye twiching are anxiety/stress related. Will mail avs about things she can do for eye twitching.   Follow Up Instructions:   I discussed the assessment and treatment plan with the patient. The patient was provided an opportunity to ask questions and all were answered. The patient agreed with the plan and demonstrated an understanding of the instructions.   The patient was advised to call back or seek an in-person evaluation if the symptoms worsen or if the condition fails to improve as anticipated.  I provided 25 minutes of non-face-to-face time during this encounter.   Iran Planas, PA-C

## 2018-06-13 NOTE — Patient Instructions (Signed)
Benign Essential Blepharospasm, Adult Benign essential blepharospasm (BEB) is a nervous system condition that makes a person close his or her eyes without meaning to. Over time, episodes of this condition may gradually become more frequent and forceful, and eventually involve both eyes. This can make it hard for you to keep your eyes open and do activities such as watching television, driving, or reading. If this condition is not treated, the eyes may close forcefully for long periods of time. What are the causes? The exact cause of this condition is not known. The condition may be passed down through families through an abnormal gene. Symptoms of the condition may be triggered by:  The wind.  Sunlight.  Noise.  Stress.  Fatigue.  Bright light.  Reading.  Driving.  Walking outside.  Air pollution.  Eye strain. What increases the risk? You are more likely to develop this condition if:  You are age 50 or older.  You have a family history of the condition.  You are female.  There are problems with the part of your brain that controls movements (basal ganglia).  You have a movement disorder called dystonia.  You have a history of eye diseases or trauma to the eye(s).  You take certain medicines, such as medicines that treat Parkinson disease. What are the signs or symptoms? The first symptom of this condition is frequent eye blinking or twitching that you cannot control. It may happen during the day and disappear at night. Other early symptoms include:  Eye dryness and irritation.  Eye irritation or pain from bright lights (photophobia). You may get temporary relief from your symptoms when you sing, yawn, chew, or laugh. Later symptoms of this condition include:  Winking and squinting for longer than usual.  Muscle spasms in your tongue and jaw (Meigesyndrome).  Inability to keep your eyes open for long periods of time. Over time, symptoms may get stronger and last  longer. How is this diagnosed? This condition is diagnosed based on your symptoms, your medical history, and a physical exam. How is this treated? There is no cure for this condition, but treatment can help with symptoms. Treatment options include:  Applying moisturizing eye drops (artificial tears) to the eye. These drops help to relieve eye irritation and dryness.  Getting an injection of botulinum toxin into the muscles that control eyelid movement. This treatment may need to be repeated every few months.  Taking medicines such as muscle relaxants and anti-anxiety medicines.  Having surgery to remove part of the eyelid muscles (myectomy). This may be done if injections of botulinum toxin do not work or they stop working. Follow these instructions at home: Lifestyle  Wear tinted sunglasses that block UV (ultraviolet) light.  Wear eye protection outdoors on windy days.  Get enough sleep.  Try to manage or avoid stressful situations.  Do not watch TV or have screen time for long periods of time.  Avoid things that trigger your condition. General instructions  Learn as much as you can about your condition.  Work closely with your team of health care providers.  Use over-the-counter and prescription medicines, including eye drops, only as told by your health care provider.  Keep all follow-up visits as told by your health care provider. This is important.  Do not drive if you are having an episode that affects how well you can see.  Keep your eyelids clean. Wash them daily with mild soap and water. This will help to prevent irritation and infection. Contact   a health care provider if:  Your symptoms are not controlled with treatment.  Your eyes are red, teary, or dry.  Your eyes droop.  You feel anxious or depressed. Get help right away if:  You cannot open your eyes. Summary  Benign essential blepharospasm (BEB) is a nervous system condition that makes a person  close his or her eyes without meaning to.  The exact cause of this condition is not known. The condition may be passed down through families through an abnormal gene.  There is no cure for this condition, but treatment can help with symptoms. This information is not intended to replace advice given to you by your health care provider. Make sure you discuss any questions you have with your health care provider. Document Released: 02/26/2002 Document Revised: 05/11/2016 Document Reviewed: 05/11/2016 Elsevier Interactive Patient Education  2019 [employer]  

## 2018-06-15 ENCOUNTER — Telehealth: Payer: Self-pay | Admitting: Physician Assistant

## 2018-06-15 NOTE — Telephone Encounter (Signed)
Can we mail last AVS to patient.

## 2018-06-19 NOTE — Telephone Encounter (Signed)
It has been mailed to the patient.

## 2018-08-02 ENCOUNTER — Other Ambulatory Visit: Payer: Self-pay | Admitting: Nurse Practitioner

## 2018-09-06 ENCOUNTER — Encounter: Payer: Self-pay | Admitting: Physician Assistant

## 2018-09-06 ENCOUNTER — Ambulatory Visit (INDEPENDENT_AMBULATORY_CARE_PROVIDER_SITE_OTHER): Payer: BLUE CROSS/BLUE SHIELD | Admitting: Physician Assistant

## 2018-09-06 VITALS — BP 167/62 | HR 60 | Temp 98.6°F | Ht 63.0 in | Wt 174.0 lb

## 2018-09-06 DIAGNOSIS — E782 Mixed hyperlipidemia: Secondary | ICD-10-CM | POA: Diagnosis not present

## 2018-09-06 DIAGNOSIS — I10 Essential (primary) hypertension: Secondary | ICD-10-CM

## 2018-09-06 DIAGNOSIS — E119 Type 2 diabetes mellitus without complications: Secondary | ICD-10-CM | POA: Diagnosis not present

## 2018-09-06 DIAGNOSIS — F411 Generalized anxiety disorder: Secondary | ICD-10-CM | POA: Diagnosis not present

## 2018-09-06 DIAGNOSIS — R030 Elevated blood-pressure reading, without diagnosis of hypertension: Secondary | ICD-10-CM | POA: Diagnosis not present

## 2018-09-06 DIAGNOSIS — F430 Acute stress reaction: Secondary | ICD-10-CM

## 2018-09-06 LAB — LIPID PANEL W/REFLEX DIRECT LDL
Cholesterol: 162 mg/dL (ref <200)
HDL: 66 mg/dL (ref >=50)
LDL Cholesterol (Calc): 77 mg/dL (calc)
Non-HDL Cholesterol (Calc): 96 mg/dL (calc) (ref <130)
Total CHOL/HDL Ratio: 2.5 (calc) (ref <5.0)
Triglycerides: 106 mg/dL (ref <150)

## 2018-09-06 LAB — POCT GLYCOSYLATED HEMOGLOBIN (HGB A1C): Hemoglobin A1C: 5.9 % — AB (ref 4.0–5.6)

## 2018-09-06 LAB — COMPLETE METABOLIC PANEL WITH GFR
AG Ratio: 1.8 (calc) (ref 1.0–2.5)
ALT: 23 U/L (ref 6–29)
AST: 23 U/L (ref 10–35)
Albumin: 4.5 g/dL (ref 3.6–5.1)
Alkaline phosphatase (APISO): 60 U/L (ref 37–153)
BUN: 16 mg/dL (ref 7–25)
CO2: 30 mmol/L (ref 20–32)
Calcium: 9.7 mg/dL (ref 8.6–10.4)
Chloride: 101 mmol/L (ref 98–110)
Creat: 0.85 mg/dL (ref 0.50–0.99)
GFR, Est African American: 86 mL/min/{1.73_m2} (ref >=60)
GFR, Est Non African American: 74 mL/min/{1.73_m2} (ref >=60)
Globulin: 2.5 g/dL (calc) (ref 1.9–3.7)
Glucose, Bld: 121 mg/dL — ABNORMAL HIGH (ref 65–99)
Potassium: 4 mmol/L (ref 3.5–5.3)
Sodium: 140 mmol/L (ref 135–146)
Total Bilirubin: 0.6 mg/dL (ref 0.2–1.2)
Total Protein: 7 g/dL (ref 6.1–8.1)

## 2018-09-06 MED ORDER — HYDROXYZINE HCL 10 MG PO TABS: ORAL_TABLET | ORAL | 2 refills | Status: DC

## 2018-09-06 MED ORDER — ESCITALOPRAM OXALATE 5 MG PO TABS: 5.0000 mg | ORAL_TABLET | Freq: Every day | ORAL | 1 refills | Status: DC

## 2018-09-06 NOTE — Progress Notes (Signed)
Subjective:    Patient ID: Diane Brooks, female    DOB: [birth year], 63 y.o.   MRN: [id number]  HPI  Pt is a 63 yo female with HTN, T2DM, migraines, GERD who presents to the clinic for 3 month follow up.   DM- she is checking her fasting glucose at home and running around 110's to 120's. She denies any open wounds. Denies hypoglycemia. She is taking medication daily(onglyza and metformin). She is trying to keep low sugar diet.   Pt denies any CP, palpitations, headaches or vision changes. She is taking her BP at home and ranging 110's-120's over 70's.   Pt is VERY anxious today. This is the first time she has left the house in 4 months for anything. She is walking outside. She is worrying all the time. She will not even go to grocery store. She becomes emotional and tearful at times because she is just worrying. Vistaril does help.   .. Active Ambulatory Problems    Diagnosis Date Noted  . Hyperlipidemia 03/28/2013  . Hiatal hernia 03/28/2013  . Essential hypertension, benign 03/28/2013  . Diabetes mellitus type II, controlled ([place]) 03/28/2013  . GERD (gastroesophageal reflux disease) 05/10/2013  . Globus sensation 05/10/2013  . Atypical chest pain 05/10/2013  . Internal hemorrhoids 05/10/2013  . Decreased libido 05/10/2013  . High risk HPV infection 05/24/2013  . Colon cancer screening 05/25/2013  . Obesity (BMI 30-39.9) 06/27/2013  . Diabetes mellitus with microalbuminuria ([place]) 06/27/2013  . Abnormal weight gain 06/27/2013  . Hyperlipemia 06/27/2013  . Cyst, eyelid sebaceous 06/27/2013  . Onychomycosis 06/27/2013  . 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 Sudoriferous cyst 07/17/2013  . Seasonal allergies 08/03/2013  . Allergic rhinitis 08/03/2013  . Tinea versicolor 08/03/2013  . Obesity, unspecified 10/29/2013  . Hot flashes 10/29/2013  . Abdominal pain, epigastric 11/09/2013  . Migraine headache without aura 10/16/2015  . Mid back pain 10/24/2015  . Left-sided low back  pain with left-sided sciatica 10/24/2015  . Neck pain 10/24/2015  . De Quervain's disease (radial styloid tenosynovitis) 11/14/2015  . BPV (benign positional vertigo) 11/14/2015  . Muscle spasm of back 11/14/2015  . Polyarthralgia 11/19/2015  . Mouth sores 12/15/2015  . Black stools 12/15/2015  . Light stools 03/03/2016  . Systolic murmur 67/89/3810  . Low serum vitamin B12 04/20/2017  . Memory changes 04/24/2017  . Itching 06/13/2017  . Anxiety 06/13/2017  . Lipoma of left lower extremity 06/13/2017  . Reactive airway disease 07/03/2017  . Persistent cough for 3 weeks or longer 07/03/2017  . Shortness of breath 07/07/2017  . Myofascial pain syndrome 07/14/2017  . Vitamin D deficiency 09/12/2017  . Visit for screening mammogram 09/12/2017  . Microalbuminuria 09/13/2017  . Hypertension associated with diabetes ([place]) 03/07/2018  . Chronic left-sided low back pain without sciatica 03/09/2018  . Blepharospasm 06/13/2018  . Anxiety as acute reaction to exceptional stress 09/07/2018   Resolved Ambulatory Problems    Diagnosis Date Noted  . No Resolved Ambulatory Problems   Past Medical History:  Diagnosis Date  . Diabetes mellitus without complication ([place])   . Hypertension       Review of Systems  All other systems reviewed and are negative.      Objective:   Physical Exam Vitals signs reviewed.  Constitutional:      Appearance: Normal appearance.  Cardiovascular:     Rate and Rhythm: Normal rate and regular rhythm.     Heart sounds: Murmur present.  Pulmonary:     Effort:  Pulmonary effort is normal.     Breath sounds: Normal breath sounds.  Neurological:     General: No focal deficit present.     Mental Status: She is alert and oriented to person, place, and time.  Psychiatric:     Comments: anxious           Assessment & Plan:  [name] KitchenMarland KitchenVernita was seen today for diabetes and anxiety.  Diagnoses and all orders for this visit:  Controlled type 2 diabetes  mellitus without complication, without long-term current use of insulin (HCC) -     POCT glycosylated hemoglobin (Hb A1C) -     COMPLETE METABOLIC PANEL WITH GFR  Mixed hyperlipidemia -     Lipid Panel w/reflex Direct LDL  Anxiety as acute reaction to exceptional stress -     hydrOXYzine (ATARAX/VISTARIL) 10 MG tablet; 1 tablet by mouth up to three times a day for anxiety -     escitalopram (LEXAPRO) 5 MG tablet; Take 1 tablet (5 mg total) by mouth daily. -     Ambulatory referral to Psychology  Elevated blood pressure reading  Essential hypertension, benign  .[name] Kitchen Depression screen [facility] 2/9 09/06/2018 07/14/2017 04/19/2017 03/30/2013  Decreased Interest 0 3 0 0  Down, Depressed, Hopeless 2 1 2  0  PHQ - 2 Score 2 4 2  0  Altered sleeping 1 3 0 -  Tired, decreased energy 0 3 1 -  Change in appetite 1 0 3 -  Feeling bad or failure about yourself  1 0 2 -  Trouble concentrating 0 0 2 -  Moving slowly or fidgety/restless 1 3 2  -  Suicidal thoughts 0 0 0 -  PHQ-9 Score 6 13 12  -  Difficult doing work/chores Not difficult at all Very difficult - -   .[name] Kitchen GAD 7 : Generalized Anxiety Score 09/06/2018 07/14/2017  Nervous, Anxious, on Edge 2 0  Control/stop worrying 1 3  Worry too much - different things 1 0  Trouble relaxing 2 3  Restless 0 0  Easily annoyed or irritable 1 3  Afraid - awful might happen 1 1  Total GAD 7 Score 8 10  Anxiety Difficulty Not difficult at all Very difficult    Her PHQ-9/GAD numbers appear to be better than last year but patient is very anxious about COVID in office and what her life will look like. I do think she would benefit from teletherapy. Referral placed. Refilled vistaril. Added low dose lexapro. Follow up in 6 weeks. Continue to walk for exercise.   .. Results for orders placed or performed in visit on 09/06/18  Lipid Panel w/reflex Direct LDL  Result Value Ref Range   Cholesterol 162 <200 mg/dL   HDL 66 > OR = 50 mg/dL   Triglycerides 106 <150 mg/dL    LDL Cholesterol (Calc) 77 mg/dL (calc)   Total CHOL/HDL Ratio 2.5 <5.0 (calc)   Non-HDL Cholesterol (Calc) 96 <130 mg/dL (calc)  COMPLETE METABOLIC PANEL WITH GFR  Result Value Ref Range   Glucose, Bld 121 (H) 65 - 99 mg/dL   BUN 16 7 - 25 mg/dL   Creat 0.85 0.50 - 0.99 mg/dL   GFR, Est Non African American 74 > OR = 60 mL/min/1.48m2   GFR, Est African American 86 > OR = 60 mL/min/1.28m2   BUN/Creatinine Ratio NOT APPLICABLE 6 - 22 (calc)   Sodium 140 135 - 146 mmol/L   Potassium 4.0 3.5 - 5.3 mmol/L   Chloride 101 98 - 110 mmol/L  CO2 30 20 - 32 mmol/L   Calcium 9.7 8.6 - 10.4 mg/dL   Total Protein 7.0 6.1 - 8.1 g/dL   Albumin 4.5 3.6 - 5.1 g/dL   Globulin 2.5 1.9 - 3.7 g/dL (calc)   AG Ratio 1.8 1.0 - 2.5 (calc)   Total Bilirubin 0.6 0.2 - 1.2 mg/dL   Alkaline phosphatase (APISO) 60 37 - 153 U/L   AST 23 10 - 35 U/L   ALT 23 6 - 29 U/L  POCT glycosylated hemoglobin (Hb A1C)  Result Value Ref Range   Hemoglobin A1C 5.9 (A) 4.0 - 5.6 %   HbA1c POC (<> result, manual entry)     HbA1c, POC (prediabetic range)     HbA1c, POC (controlled diabetic range)     A!C controlled.  Continue same medications.  BP not controlled today. Forgot to recheck. BP readings at home have all been great.  Labs ordered. On statin.  Vaccines up to date.  Needs eye exam.  Follow up in 3 months.

## 2018-09-07 DIAGNOSIS — F430 Acute stress reaction: Secondary | ICD-10-CM | POA: Insufficient documentation

## 2018-09-07 DIAGNOSIS — F411 Generalized anxiety disorder: Secondary | ICD-10-CM | POA: Insufficient documentation

## 2018-09-07 NOTE — Progress Notes (Signed)
Call pt: your cholesterol looks fantastic. Kidney and liver looks great.

## 2018-10-29 ENCOUNTER — Other Ambulatory Visit: Payer: Self-pay | Admitting: Physician Assistant

## 2018-10-29 DIAGNOSIS — I10 Essential (primary) hypertension: Secondary | ICD-10-CM

## 2019-03-28 ENCOUNTER — Encounter: Payer: Self-pay | Admitting: Physician Assistant

## 2019-03-28 ENCOUNTER — Ambulatory Visit (INDEPENDENT_AMBULATORY_CARE_PROVIDER_SITE_OTHER): Payer: 59 | Admitting: Physician Assistant

## 2019-03-28 ENCOUNTER — Other Ambulatory Visit: Payer: Self-pay

## 2019-03-28 VITALS — BP 133/74 | HR 75 | Ht 63.0 in | Wt 173.0 lb

## 2019-03-28 DIAGNOSIS — E119 Type 2 diabetes mellitus without complications: Secondary | ICD-10-CM

## 2019-03-28 DIAGNOSIS — I10 Essential (primary) hypertension: Secondary | ICD-10-CM

## 2019-03-28 DIAGNOSIS — B351 Tinea unguium: Secondary | ICD-10-CM

## 2019-03-28 DIAGNOSIS — E1129 Type 2 diabetes mellitus with other diabetic kidney complication: Secondary | ICD-10-CM

## 2019-03-28 DIAGNOSIS — K224 Dyskinesia of esophagus: Secondary | ICD-10-CM

## 2019-03-28 DIAGNOSIS — E782 Mixed hyperlipidemia: Secondary | ICD-10-CM

## 2019-03-28 DIAGNOSIS — K219 Gastro-esophageal reflux disease without esophagitis: Secondary | ICD-10-CM | POA: Diagnosis not present

## 2019-03-28 DIAGNOSIS — G245 Blepharospasm: Secondary | ICD-10-CM

## 2019-03-28 DIAGNOSIS — E1159 Type 2 diabetes mellitus with other circulatory complications: Secondary | ICD-10-CM

## 2019-03-28 DIAGNOSIS — R809 Proteinuria, unspecified: Secondary | ICD-10-CM

## 2019-03-28 DIAGNOSIS — F419 Anxiety disorder, unspecified: Secondary | ICD-10-CM

## 2019-03-28 DIAGNOSIS — I152 Hypertension secondary to endocrine disorders: Secondary | ICD-10-CM

## 2019-03-28 LAB — POCT GLYCOSYLATED HEMOGLOBIN (HGB A1C): Hemoglobin A1C: 5.9 % — AB (ref 4.0–5.6)

## 2019-03-28 MED ORDER — CICLOPIROX 8 % EX SOLN: Freq: Every day | CUTANEOUS | 0 refills | Status: DC

## 2019-03-28 MED ORDER — LOSARTAN POTASSIUM 25 MG PO TABS: 25.0000 mg | ORAL_TABLET | Freq: Every day | ORAL | 3 refills | Status: DC

## 2019-03-28 MED ORDER — HYDROCHLOROTHIAZIDE 25 MG PO TABS: 25.0000 mg | ORAL_TABLET | Freq: Every day | ORAL | 3 refills | Status: DC

## 2019-03-28 MED ORDER — ATENOLOL 25 MG PO TABS: 25.0000 mg | ORAL_TABLET | Freq: Every day | ORAL | 3 refills | Status: DC

## 2019-03-28 MED ORDER — ATORVASTATIN CALCIUM 20 MG PO TABS: 20.0000 mg | ORAL_TABLET | Freq: Every day | ORAL | 3 refills | Status: DC

## 2019-03-28 MED ORDER — METFORMIN HCL 1000 MG PO TABS: 1000.0000 mg | ORAL_TABLET | Freq: Two times a day (BID) | ORAL | 1 refills | Status: DC

## 2019-03-28 MED ORDER — ONGLYZA 2.5 MG PO TABS: 2.5000 mg | ORAL_TABLET | Freq: Every day | ORAL | 1 refills | Status: DC

## 2019-03-28 NOTE — Patient Instructions (Addendum)
Laparoscopic Nissen Fundoplication  Laparoscopic Nissen fundoplication is a surgery to relieve heartburn and other problems caused by gastric fluids flowing up into your esophagus. The esophagus is the part of the body that moves food from your mouth to your stomach. Normally, the muscle that sits between your stomach and your esophagus (lower esophageal sphincter, LES) keeps stomach fluids in your stomach. In some people, the LES does not work properly, and stomach fluids flow up into the esophagus. This can happen when part of the stomach bulges through the LES (hiatal hernia). The backward flow of stomach fluids can cause a type of severe and long-lasting heartburn that is called gastroesophageal reflux disease (GERD). You may need this surgery if other treatments for GERD have not helped. Tell a health care provider about:  Any allergies you have.  All medicines you are taking, including vitamins, herbs, eye drops, creams, and over-the-counter medicines.  Any problems you or family members have had with anesthetic medicines.  Any blood disorders you have.  Any surgeries you have had.  Any medical conditions you have.  Whether you are pregnant or may be pregnant. What are the risks? Generally, this is a safe procedure. However, problems may occur, including:  Infection.  Bleeding.  Damage to other structures or organs. This can include damage to the lung, causing a collapsed lung.  Trouble swallowing (dysphagia).  Blood clots. What happens before the procedure? Medicines  Ask your health care provider about: ? Changing or stopping your regular medicines. This is especially important if you are taking diabetes medicines or blood thinners. ? Taking medicines such as aspirin and ibuprofen. These medicines can thin your blood. Do not take these medicines unless your health care provider tells you to take them. ? Taking over-the-counter medicines, vitamins, herbs, and  supplements. Staying hydrated Follow instructions from your health care provider about hydration, which may include:  Up to 2 hours before the procedure - you may continue to drink clear liquids, such as water, clear fruit juice, black coffee, and plain tea. Eating and drinking restrictions Follow instructions from your health care provider about eating and drinking, which may include:  8 hours before the procedure - stop eating heavy meals or foods such as meat, fried foods, or fatty foods.  6 hours before the procedure - stop eating light meals or foods, such as toast or cereal.  6 hours before the procedure - stop drinking milk or drinks that contain milk.  2 hours before the procedure - stop drinking clear liquids. General instructions  Plan to have someone take you home from the hospital or clinic.  Ask your health care provider what steps will be taken to help prevent infection. These may include: ? Removing hair at the surgery site. ? Washing skin with a germ-killing soap. What happens during the procedure?  An IV will be inserted into one of your veins.  You will be given a medicine to make you fall asleep (general anesthetic).  The surgeon will make a small incision in your abdomen and insert a tube through the incision.  Your abdomen will be filled with a gas. This helps the surgeon see your organs better, and it makes more space to work.  The surgeon will insert a thin, lighted tube (laparoscope) through the small incision. This allows your surgeon to see into your abdomen.  The surgeon will make several other small incisions in your abdomen to insert the other instruments that are needed during the procedure.  Another  instrument (dilator) will be passed through your mouth and down your esophagus into the upper part of your stomach. The dilator will prevent your LES from being closed too tightly during surgery.  The upper part of your stomach will be wrapped around  the lower part of your esophagus and will be stitched into place. This will strengthen the lower esophageal sphincter and prevent reflux.  If you have a hiatal hernia, it will be repaired.  The gas will be released from your abdomen.  All instruments will be removed, and the incisions will be closed with stitches (sutures).  A bandage (dressing) will be placed on your skin over the incisions. The procedure may vary among health care providers and hospitals. What happens after the procedure?  Your blood pressure, heart rate, breathing rate, and blood oxygen level will be monitored until you leave the hospital or clinic.  You will be given pain medicine as needed.  Your IV will be kept in until you are able to drink fluids.  You will be encouraged to get up and walk around as soon as possible. Summary  Laparoscopic Nissen fundoplication is a surgery to relieve heartburn and other problems caused by gastric fluids flowing up into your esophagus.  You may need this surgery if other treatments for GERD have not helped.  Follow instructions from your health care provider about eating and drinking before the procedure.  Your surgeon will use a thin, lighted tube (laparoscope) that is inserted through a small incision, allowing the surgeon to see into your abdomen. This information is not intended to replace advice given to you by your health care provider. Make sure you discuss any questions you have with your health care provider. Document Revised: 05/13/2017 Document Reviewed: 04/06/2017 Elsevier Patient Education  2020 Pulcifer Nissen por laparoscopia, cuidados posteriores Laparoscopic Nissen Fundoplication, Care After Esta hoja le proporciona informacin sobre cmo cuidarse despus del procedimiento. Su mdico tambin podr darle instrucciones ms especficas. Comunquese con su mdico si tiene problemas o preguntas. Qu puedo esperar despus del  procedimiento? Despus del procedimiento, es comn [employer] siguientes sntomas:  Dificultad para tragar (disfagia).  Molestia al tragar.  Dolor abdominal.  Meteorismo. Siga estas indicaciones en su casa: Medicamentos  [employer] de venta libre y los recetados solamente como se lo haya indicado el mdico.  Pregunte al mdico o al farmacutico si puede triturar cualquier comprimido que est tomando. Tome solo medicamentos lquidos como se lo hayan indicado.  No conduzca ni use maquinaria pesada mientras toma analgsicos recetados. Cuidados de la incisin   Siga las indicaciones del mdico acerca del cuidado de las incisiones. Asegrese de hacer lo siguiente: ? Lvese las manos con agua y jabn antes de Quarry manager las vendas (vendaje). Use desinfectante para manos si no dispone de [place]. ? Cambie el vendaje como se lo haya indicado el mdico. ? No retire los puntos (suturas), la goma para cerrar la piel o las tiras [place]. Es posible que estos cierres cutneos Animal nutritionist en la piel durante 2semanas o ms. Si los bordes de las tiras adhesivas empiezan a despegarse y Therapist, sports, puede recortar los que estn sueltos. No retire las tiras [facility] por completo a menos que el mdico se lo indique.  [place] zona de la incisin para detectar signos de infeccin. Est atento a los siguientes signos: ? Dolor, hinchazn o enrojecimiento. ? Lquido o sangre. ? Calor. ? Pus o mal olor. Comida  y bebida   Siga las indicaciones del mdico respecto de las restricciones de comidas o bebidas. Siga las instrucciones cuidadosamente. ? Tal vez deba hacer una dieta lquida durante 2semanas y, a continuacin, una dieta de comidas blandas durante 2semanas. ? Debe comer lentamente, tomar bocados pequeos y [employer]. ? Debe comer o beber en una posicin erguida. ? Debe retomar su dieta habitual gradualmente.  Beba suficiente  lquido como para Theatre manager la orina de color amarillo plido. Actividad   Haga reposo como se lo haya indicado el mdico.  Evite estar sentado durante largos perodos sin moverse. Levntese y camine un poco cada 1 a 2 horas. Esto es importante para mejorar el flujo sanguneo y la respiracin.  No levante ningn objeto que pese ms de 10libras (4,5kg) o el lmite de peso que le hayan indicado, hasta que el mdico le diga que puede [place].  Evite las actividades que exijan mucho esfuerzo.  Pregntele al mdico qu actividades son seguras para usted. Pregunte cundo puede: ? Reanudar la actividad sexual. ? Conducir. ? Regresar a trabajar.  Reanude sus actividades normales segn lo indicado por el mdico. Baarse  No tome baos de inmersin, no nade ni use el jacuzzi hasta que el mdico lo autorice. Pregntele al mdico si puede ducharse. Thurston Pounds solo le permitan darse baos de [place]. Instrucciones generales  No consuma ningn producto que contenga nicotina o tabaco, como cigarrillos y Psychologist, sport and exercise. Estos pueden retrasar la cicatrizacin despus de la ciruga. Si necesita ayuda para dejar de fumar, consulte al mdico.  Concurra a todas las visitas de seguimiento como se lo haya indicado el mdico. Esto es importante. Comunquese con un mdico si tiene:  Fiebre.  Un dolor que no se alivia con medicamentos.  Nuseas o vmitos frecuentes.  Distensin abdominal con dolor.  Estreimiento.  Dificultad para tragar.  Una tos que no desaparece.  Una incisin que se abre.  Una zona de incisin que est caliente al tacto.  Enrojecimiento, hinchazn o dolor en cualquiera de las zonas de incisin.  Lquido, sangre o pus que emanan de cualquier incisin. Solicite ayuda de inmediato si:  Tiene dolor o distensin abdominal grave.  No puede tragar.  Tiene vmitos que no se detienen.  Observa sangre en el vmito.  Tiene dificultad para  respirar. Resumen  Despus del procedimiento, es frecuente tener dificultad o molestia al tragar.  Siga las indicaciones del mdico respecto de las restricciones de comidas o bebidas. Tal vez deba hacer una dieta lquida durante 2semanas y, a continuacin, una dieta de comidas blandas durante 2semanas.  Reanude sus actividades normales segn lo indicado por el mdico.  Concurra a todas las visitas de seguimiento como se lo haya indicado el mdico. Esto es importante. Esta informacin no tiene Marine scientist el consejo del mdico. Asegrese de hacerle al mdico cualquier pregunta que tenga. Document Revised: 05/18/2017 Document Reviewed: 05/18/2017 Elsevier Patient Education  [place].   Benign Essential Blepharospasm, Adult Benign essential blepharospasm (BEB) is a nervous system condition that makes a person close his or her eyes without meaning to. Over time, episodes of this condition may gradually become more frequent and forceful, and eventually involve both eyes. This can make it hard for you to keep your eyes open and do activities such as watching television, driving, or reading. If this condition is not treated, the eyes may close forcefully for long periods of time. What are the causes? The exact cause of this condition is not  known. The condition may be passed down through families through an abnormal gene. Symptoms of the condition may be triggered by:  The wind.  Sunlight.  Noise.  Stress.  Fatigue.  Bright light.  Reading.  Driving.  Walking outside.  Air pollution.  Eye strain. What increases the risk? You are more likely to develop this condition if:  You are age 66 or older.  You have a family history of the condition.  You are female.  There are problems with the part of your brain that controls movements (basal ganglia).  You have a movement disorder called dystonia.  You have a history of eye diseases or trauma to the  eye(s).  You take certain medicines, such as medicines that treat Parkinson disease. What are the signs or symptoms? The first symptom of this condition is frequent eye blinking or twitching that you cannot control. It may happen during the day and disappear at night. Other early symptoms include:  Eye dryness and irritation.  Eye irritation or pain from bright lights (photophobia). You may get temporary relief from your symptoms when you sing, yawn, chew, or laugh. Later symptoms of this condition include:  Winking and squinting for longer than usual.  Muscle spasms in your tongue and jaw (Meigesyndrome).  Inability to keep your eyes open for long periods of time. Over time, symptoms may get stronger and last longer. How is this diagnosed? This condition is diagnosed based on your symptoms, your medical history, and a physical exam. How is this treated? There is no cure for this condition, but treatment can help with symptoms. Treatment options include:  Applying moisturizing eye drops (artificial tears) to the eye. These drops help to relieve eye irritation and dryness.  Getting an injection of botulinum toxin into the muscles that control eyelid movement. This treatment may need to be repeated every few months.  Taking medicines such as muscle relaxants and anti-anxiety medicines.  Having surgery to remove part of the eyelid muscles (myectomy). This may be done if injections of botulinum toxin do not work or they stop working. Follow these instructions at home: Lifestyle  Wear tinted sunglasses that block UV (ultraviolet) light.  Wear eye protection outdoors on windy days.  Get enough sleep.  Try to manage or avoid stressful situations.  Do not watch TV or have screen time for long periods of time.  Avoid things that trigger your condition. General instructions  Learn as much as you can about your condition.  Work closely with your team of health care  providers.  Use over-the-counter and prescription medicines, including eye drops, only as told by your health care provider.  Keep all follow-up visits as told by your health care provider. This is important.  Do not drive if you are having an episode that affects how well you can see.  Keep your eyelids clean. Wash them daily with mild soap and water. This will help to prevent irritation and infection. Contact a health care provider if:  Your symptoms are not controlled with treatment.  Your eyes are red, teary, or dry.  Your eyes droop.  You feel anxious or depressed. Get help right away if:  You cannot open your eyes. Summary  Benign essential blepharospasm (BEB) is a nervous system condition that makes a person close his or her eyes without meaning to.  The exact cause of this condition is not known. The condition may be passed down through families through an abnormal gene.  There is no cure  for this condition, but treatment can help with symptoms. This information is not intended to replace advice given to you by your health care provider. Make sure you discuss any questions you have with your health care provider. Document Revised: 02/18/2017 Document Reviewed: 05/11/2016 Elsevier Patient Education  2020 [employer].

## 2019-03-28 NOTE — Progress Notes (Addendum)
Subjective:    Patient ID: Diane Brooks, female    DOB: 01-03-1956, 64 y.o.   MRN: [id number]  HPI  Pt is a 64 yo female with T2DM, HTN, GERD, HLD who presents to the clinic for follow up.   She is having a lot of anxiety around covid and life right now. She does not want medication but feels "on edge" all the time.   She is taking DM and HTN medications without any problems. Recently had colonoscopy and endoscopy. She is working with GI to help GERD. She continues to have a lot of reflux and problems with GERD.   She also has a twitching right eyelid that bothers of off and on.  .. Active Ambulatory Problems    Diagnosis Date Noted  . Hyperlipidemia 03/28/2013  . Hiatal hernia 03/28/2013  . Essential hypertension, benign 03/28/2013  . Diabetes mellitus type II, controlled ([place]) 03/28/2013  . GERD (gastroesophageal reflux disease) 05/10/2013  . Globus sensation 05/10/2013  . Atypical chest pain 05/10/2013  . Internal hemorrhoids 05/10/2013  . Decreased libido 05/10/2013  . High risk HPV infection 05/24/2013  . Colon cancer screening 05/25/2013  . Obesity (BMI 30-39.9) 06/27/2013  . Diabetes mellitus with microalbuminuria ([place]) 06/27/2013  . Abnormal weight gain 06/27/2013  . Hyperlipemia 06/27/2013  . Cyst, eyelid sebaceous 06/27/2013  . Onychomycosis 06/27/2013  . 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 Sudoriferous cyst 07/17/2013  . Seasonal allergies 08/03/2013  . Allergic rhinitis 08/03/2013  . Tinea versicolor 08/03/2013  . Obesity, unspecified 10/29/2013  . Hot flashes 10/29/2013  . Abdominal pain, epigastric 11/09/2013  . Migraine headache without aura 10/16/2015  . Mid back pain 10/24/2015  . Left-sided low back pain with left-sided sciatica 10/24/2015  . Neck pain 10/24/2015  . De Quervain's disease (radial styloid tenosynovitis) 11/14/2015  . BPV (benign positional vertigo) 11/14/2015  . Muscle spasm of back 11/14/2015  . Polyarthralgia 11/19/2015  . Mouth  sores 12/15/2015  . Black stools 12/15/2015  . Light stools 03/03/2016  . Systolic murmur [id number]  . Low serum vitamin B12 04/20/2017  . Memory changes 04/24/2017  . Itching 06/13/2017  . Anxiety 06/13/2017  . Lipoma of left lower extremity 06/13/2017  . Reactive airway disease 07/03/2017  . Persistent cough for 3 weeks or longer 07/03/2017  . Shortness of breath 07/07/2017  . Myofascial pain syndrome 07/14/2017  . Vitamin D deficiency 09/12/2017  . Visit for screening mammogram 09/12/2017  . Microalbuminuria 09/13/2017  . Hypertension associated with diabetes ([place]) 03/07/2018  . Chronic left-sided low back pain without sciatica 03/09/2018  . Blepharospasm 06/13/2018  . Anxiety as acute reaction to exceptional stress 09/07/2018   Resolved Ambulatory Problems    Diagnosis Date Noted  . No Resolved Ambulatory Problems   Past Medical History:  Diagnosis Date  . Diabetes mellitus without complication ([place])   . Hypertension      Review of Systems  All other systems reviewed and are negative.      Objective:   Physical Exam Vitals reviewed.  Constitutional:      Appearance: Normal appearance. She is obese.  HENT:     Head: Normocephalic.  Cardiovascular:     Rate and Rhythm: Normal rate and regular rhythm.     Heart sounds: Murmur present.  Pulmonary:     Effort: Pulmonary effort is normal.     Breath sounds: Normal breath sounds.  Abdominal:     General: There is no distension.     Tenderness: There is no  abdominal tenderness. There is no guarding or rebound.  Neurological:     General: No focal deficit present.     Mental Status: She is alert.  Psychiatric:        Mood and Affect: Mood normal.           Assessment & Plan:  [name] KitchenMarland KitchenKristee was seen today for diabetes.  Diagnoses and all orders for this visit:  Controlled type 2 diabetes mellitus without complication, without long-term current use of insulin (HCC) -     POCT glycosylated hemoglobin (Hb  A1C) -     saxagliptin HCl (ONGLYZA) 2.5 MG TABS tablet; Take 1 tablet (2.5 mg total) by mouth daily. -     metFORMIN (GLUCOPHAGE) 1000 MG tablet; Take 1 tablet (1,000 mg total) by mouth 2 (two) times daily with a meal. -     atorvastatin (LIPITOR) 20 MG tablet; Take 1 tablet (20 mg total) by mouth daily.  Diabetes mellitus with microalbuminuria (HCC) -     losartan (COZAAR) 25 MG tablet; Take 1 tablet (25 mg total) by mouth daily.  Essential hypertension, benign -     losartan (COZAAR) 25 MG tablet; Take 1 tablet (25 mg total) by mouth daily. -     hydrochlorothiazide (HYDRODIURIL) 25 MG tablet; Take 1 tablet (25 mg total) by mouth daily. -     atenolol (TENORMIN) 25 MG tablet; Take 1 tablet (25 mg total) by mouth daily.  Gastroesophageal reflux disease without esophagitis  Nutcracker esophagus  Mixed hyperlipidemia  Toenail fungus -     ciclopirox (PENLAC) 8 % solution; Apply topically at bedtime. Apply over nail and surrounding skin. Apply daily over previous coat. After seven (7) days, may remove with alcohol and continue cycle.  Hypertension associated with diabetes ([place])  Blepharospasm    Results for orders placed or performed in visit on 03/28/19  POCT glycosylated hemoglobin (Hb A1C)  Result Value Ref Range   Hemoglobin A1C 5.9 (A) 4.0 - 5.6 %   HbA1c POC (<> result, manual entry)     HbA1c, POC (prediabetic range)     HbA1c, POC (controlled diabetic range)     A1C looks great.  Stay on same medications.  On statin.  On ARB. BP to goal.  Discussed need for eye exam.  Vaccines up to date.  Follow up in 3 months.   Discussed GERD. Pt is managed by GI. Perhaps ask about Nissen procedure for uncontrolled GERD. HO given.  Discussed blephraspams and symptomatic care. I do think patient is very stressed and anxious. She does not want to treat her anxiey. We could treat with medication. Pt will hold off for now. HO given.   Toefungus- start with penlac. Discussed can  take some time. Follow up in 6 months.

## 2019-03-30 ENCOUNTER — Encounter: Payer: Self-pay | Admitting: Physician Assistant

## 2019-03-30 ENCOUNTER — Other Ambulatory Visit: Payer: Self-pay

## 2019-03-30 DIAGNOSIS — E119 Type 2 diabetes mellitus without complications: Secondary | ICD-10-CM

## 2019-03-30 DIAGNOSIS — R809 Proteinuria, unspecified: Secondary | ICD-10-CM

## 2019-03-30 DIAGNOSIS — E1129 Type 2 diabetes mellitus with other diabetic kidney complication: Secondary | ICD-10-CM

## 2019-03-30 DIAGNOSIS — I10 Essential (primary) hypertension: Secondary | ICD-10-CM

## 2019-03-30 DIAGNOSIS — K219 Gastro-esophageal reflux disease without esophagitis: Secondary | ICD-10-CM

## 2019-03-30 DIAGNOSIS — K224 Dyskinesia of esophagus: Secondary | ICD-10-CM

## 2019-03-30 DIAGNOSIS — B351 Tinea unguium: Secondary | ICD-10-CM

## 2019-03-30 MED ORDER — METFORMIN HCL 1000 MG PO TABS: 1000.0000 mg | ORAL_TABLET | Freq: Two times a day (BID) | ORAL | 1 refills | Status: DC

## 2019-03-30 MED ORDER — CICLOPIROX 8 % EX SOLN: Freq: Every day | CUTANEOUS | 0 refills | Status: DC

## 2019-03-30 MED ORDER — ATORVASTATIN CALCIUM 20 MG PO TABS: 20.0000 mg | ORAL_TABLET | Freq: Every day | ORAL | 3 refills | Status: DC

## 2019-03-30 MED ORDER — LOSARTAN POTASSIUM 25 MG PO TABS: 25.0000 mg | ORAL_TABLET | Freq: Every day | ORAL | 3 refills | Status: DC

## 2019-03-30 MED ORDER — ATENOLOL 25 MG PO TABS: 25.0000 mg | ORAL_TABLET | Freq: Every day | ORAL | 3 refills | Status: DC

## 2019-03-30 MED ORDER — PANTOPRAZOLE SODIUM 40 MG PO TBEC: 40.0000 mg | DELAYED_RELEASE_TABLET | Freq: Every day | ORAL | 1 refills | Status: DC

## 2019-03-30 MED ORDER — ONGLYZA 2.5 MG PO TABS: 2.5000 mg | ORAL_TABLET | Freq: Every day | ORAL | 1 refills | Status: DC

## 2019-03-30 MED ORDER — HYDROCHLOROTHIAZIDE 25 MG PO TABS: 25.0000 mg | ORAL_TABLET | Freq: Every day | ORAL | 3 refills | Status: DC

## 2019-03-30 NOTE — Telephone Encounter (Signed)
Merceda called and she wanted her medication sent to [facility]. I have sent them to [facility]. However, she was also wanting Pantoprazole. She would like this sent to [facility].

## 2019-04-13 ENCOUNTER — Ambulatory Visit (INDEPENDENT_AMBULATORY_CARE_PROVIDER_SITE_OTHER): Payer: 59 | Admitting: Physician Assistant

## 2019-04-13 ENCOUNTER — Encounter: Payer: Self-pay | Admitting: Physician Assistant

## 2019-04-13 ENCOUNTER — Ambulatory Visit (INDEPENDENT_AMBULATORY_CARE_PROVIDER_SITE_OTHER): Payer: 59

## 2019-04-13 ENCOUNTER — Other Ambulatory Visit: Payer: Self-pay

## 2019-04-13 VITALS — BP 169/74 | HR 70 | Ht 64.0 in | Wt 172.0 lb

## 2019-04-13 DIAGNOSIS — M7918 Myalgia, other site: Secondary | ICD-10-CM | POA: Diagnosis not present

## 2019-04-13 DIAGNOSIS — M5412 Radiculopathy, cervical region: Secondary | ICD-10-CM | POA: Diagnosis not present

## 2019-04-13 DIAGNOSIS — G8929 Other chronic pain: Secondary | ICD-10-CM | POA: Diagnosis not present

## 2019-04-13 DIAGNOSIS — M5416 Radiculopathy, lumbar region: Secondary | ICD-10-CM | POA: Diagnosis not present

## 2019-04-13 DIAGNOSIS — M47812 Spondylosis without myelopathy or radiculopathy, cervical region: Secondary | ICD-10-CM

## 2019-04-13 DIAGNOSIS — M47816 Spondylosis without myelopathy or radiculopathy, lumbar region: Secondary | ICD-10-CM

## 2019-04-13 DIAGNOSIS — M5030 Other cervical disc degeneration, unspecified cervical region: Secondary | ICD-10-CM

## 2019-04-13 DIAGNOSIS — J0100 Acute maxillary sinusitis, unspecified: Secondary | ICD-10-CM

## 2019-04-13 DIAGNOSIS — M545 Low back pain, unspecified: Secondary | ICD-10-CM

## 2019-04-13 MED ORDER — AMOXICILLIN-POT CLAVULANATE 875-125 MG PO TABS: 1.0000 | ORAL_TABLET | Freq: Two times a day (BID) | ORAL | 0 refills | Status: DC

## 2019-04-13 MED ORDER — KETOROLAC TROMETHAMINE 60 MG/2ML IM SOLN
60.0000 mg | Freq: Once | INTRAMUSCULAR | Status: AC
  Administered 2019-04-13: 11:00:00 60 mg via INTRAMUSCULAR

## 2019-04-13 MED ORDER — CYCLOBENZAPRINE HCL 10 MG PO TABS: 10.0000 mg | ORAL_TABLET | Freq: Three times a day (TID) | ORAL | 0 refills | Status: DC | PRN

## 2019-04-13 MED ORDER — PREDNISONE 20 MG PO TABS: ORAL_TABLET | ORAL | 0 refills | Status: DC

## 2019-04-13 MED ORDER — DULOXETINE HCL 30 MG PO CPEP: 30.0000 mg | ORAL_CAPSULE | Freq: Every day | ORAL | 2 refills | Status: DC

## 2019-04-13 MED ORDER — MELOXICAM 15 MG PO TABS: 15.0000 mg | ORAL_TABLET | Freq: Every day | ORAL | 1 refills | Status: DC

## 2019-04-13 NOTE — Patient Instructions (Addendum)
Toradol today in office.  Get xrays.   Start prednisone taper.  Start daily mobic for next 2 week.  Use flexeril as needed as needed for muscle relaxer.  Will get in with physical therapy.  Massage to focus on back.  Massage [facility] Diane Brooks) at [employer] cymbalta daily for myofascial pain.    Finish augmentin for 10 days.    ..COVID-19 Vaccine Information can be found at: ShippingScam.co.uk For questions related to vaccine distribution or appointments, please email vaccine@[email or web address] .[email or web address] or call [phone].     Cervical Radiculopathy  Cervical radiculopathy means that a nerve in the neck (a cervical nerve) is pinched or bruised. This can happen because of an injury to the cervical spine (vertebrae) in the neck, or as a normal part of getting older. This can cause pain or loss of feeling (numbness) that runs from your neck all the way down to your arm and fingers. Often, this condition gets better with rest. Treatment may be needed if the condition does not get better. What are the causes?  A neck injury.  A bulging disk in your spine.  Muscle movements that you cannot control (muscle spasms).  Tight muscles in your neck due to overuse.  Arthritis.  Breakdown in the bones and joints of the spine (spondylosis) due to getting older.  Bone spurs that form near the nerves in the neck. What are the signs or symptoms?  Pain. The pain may: ? Run from the neck to the arm and hand. ? Be very bad or irritating. ? Be worse when you move your neck.  Loss of feeling or tingling in your arm or hand.  Weakness in your arm or hand, in very bad cases. How is this treated? In many cases, treatment is not needed for this condition. With rest, the condition often gets better over time. If treatment is needed, options may include:  Wearing a soft neck collar (cervical collar) for short periods of time, as told by your  doctor.  Doing exercises (physical therapy) to strengthen your neck muscles.  Taking medicines.  Having shots (injections) in your spine, in very bad cases.  Having surgery. This may be needed if other treatments do not help. The type of surgery that is used depends on the cause of your condition. Follow these instructions at home: If you have a soft neck collar:  Wear it as told by your doctor. Remove it only as told by your doctor.  Ask your doctor if you can remove the collar for cleaning and bathing. If you are allowed to remove the collar for cleaning or bathing: ? Follow instructions from your doctor about how to remove the collar safely. ? Clean the collar by wiping it with mild soap and water and drying it completely. ? Take out any removable pads in the collar every 1-2 days. Wash them by hand with soap and water. Let them air-dry completely before you put them back in the collar. ? Check your skin under the collar for redness or sores. If you see any, tell your doctor. Managing pain      Take over-the-counter and prescription medicines only as told by your doctor.  If told, put ice on the painful area. ? If you have a soft neck collar, remove it as told by your doctor. ? Put ice in a plastic bag. ? Place a towel between your skin and the bag. ? Leave the ice on for 20 minutes, 2-3 times a day.  If using ice does not help, you can try using heat. Use the heat source that your doctor recommends, such as a moist heat pack or a heating pad. ? Place a towel between your skin and the heat source. ? Leave the heat on for 20-30 minutes. ? Remove the heat if your skin turns bright red. This is very important if you are unable to feel pain, heat, or cold. You may have a greater risk of getting burned.  You may try a gentle neck and shoulder rub (massage). Activity  Rest as needed.  Return to your normal activities as told by your doctor. Ask your doctor what activities are  safe for you.  Do exercises as told by your doctor or physical therapist.  Do not lift anything that is heavier than 10 lb (4.5 kg) until your doctor tells you that it is safe. General instructions  Use a flat pillow when you sleep.  Do not drive while wearing a soft neck collar. If you do not have a soft neck collar, ask your doctor if it is safe to drive while your neck heals.  Ask your doctor if the medicine prescribed to you requires you to avoid driving or using heavy machinery.  Do not use any products that contain nicotine or tobacco, such as cigarettes, e-cigarettes, and chewing tobacco. These can delay healing. If you need help quitting, ask your doctor.  Keep all follow-up visits as told by your doctor. This is important. Contact a doctor if:  Your condition does not get better with treatment. Get help right away if:  Your pain gets worse and is not helped with medicine.  You lose feeling or feel weak in your hand, arm, face, or leg.  You have a high fever.  You have a stiff neck.  You cannot control when you poop or pee (have incontinence).  You have trouble with walking, balance, or talking. Summary  Cervical radiculopathy means that a nerve in the neck is pinched or bruised.  A nerve can get pinched from a bulging disk, arthritis, an injury to the neck, or other causes.  Symptoms include pain, tingling, or loss of feeling that goes from the neck into the arm or hand.  Weakness in your arm or hand can happen in very bad cases.  Treatment may include resting, wearing a soft neck collar, and doing exercises. You might need to take medicines for pain. In very bad cases, shots or surgery may be needed. This information is not intended to replace advice given to you by your health care provider. Make sure you discuss any questions you have with your health care provider. Document Revised: 01/27/2018 Document Reviewed: 01/27/2018 Elsevier Patient Education  [place]. Radicular Pain Radicular pain is a type of pain that spreads from your back or neck along a spinal nerve. Spinal nerves are nerves that leave the spinal cord and go to the muscles. Radicular pain is sometimes called radiculopathy, radiculitis, or a pinched nerve. When you have this type of pain, you may also have weakness, numbness, or tingling in the area of your body that is supplied by the nerve. The pain may feel sharp and burning. Depending on which spinal nerve is affected, the pain may occur in the:  Neck area (cervical radicular pain). You may also feel pain, numbness, weakness, or tingling in the arms.  Mid-spine area (thoracic radicular pain). You would feel this pain in the back and chest. This type is rare.  Lower back area (lumbar radicular pain). You would feel this pain as low back pain. You may feel pain, numbness, weakness, or tingling in the buttocks or legs. Sciatica is a type of lumbar radicular pain that shoots down the back of the leg. Radicular pain occurs when one of the spinal nerves becomes irritated or squeezed (compressed). It is often caused by something pushing on a spinal nerve, such as one of the bones of the spine (vertebrae) or one of the round cushions between vertebrae (intervertebral disks). This can result from:  An injury.  Wear and tear or aging of a disk.  The growth of a bone spur that pushes on the nerve. Radicular pain often goes away when you follow instructions from your health care provider for relieving pain at home. Follow these instructions at home: Managing pain      If directed, put ice on the affected area: ? Put ice in a plastic bag. ? Place a towel between your skin and the bag. ? Leave the ice on for 20 minutes, 2-3 times a day.  If directed, apply heat to the affected area as often as told by your health care provider. Use the heat source that your health care provider recommends, such as a moist heat pack or a heating  pad. ? Place a towel between your skin and the heat source. ? Leave the heat on for 20-30 minutes. ? Remove the heat if your skin turns bright red. This is especially important if you are unable to feel pain, heat, or cold. You may have a greater risk of getting burned. Activity   Do not sit or rest in bed for long periods of time.  Try to stay as active as possible. Ask your health care provider what type of exercise or activity is best for you.  Avoid activities that make your pain worse, such as bending and lifting.  Do not lift anything that is heavier than 10 lb (4.5 kg), or the limit that you are told, until your health care provider says that it is safe.  Practice using proper technique when lifting items. Proper lifting technique involves bending your knees and rising up.  Do strength and range-of-motion exercises only as told by your health care provider or physical therapist. General instructions  Take over-the-counter and prescription medicines only as told by your health care provider.  Pay attention to any changes in your symptoms.  Keep all follow-up visits as told by your health care provider. This is important. ? Your health care provider may send you to a physical therapist to help with this pain. Contact a health care provider if:  Your pain and other symptoms get worse.  Your pain medicine is not helping.  Your pain has not improved after a few weeks of home care.  You have a fever. Get help right away if:  You have severe pain, weakness, or numbness.  You have difficulty with bladder or bowel control. Summary  Radicular pain is a type of pain that spreads from your back or neck along a spinal nerve.  When you have radicular pain, you may also have weakness, numbness, or tingling in the area of your body that is supplied by the nerve.  The pain may feel sharp or burning.  Radicular pain may be treated with ice, heat, medicines, or physical  therapy. This information is not intended to replace advice given to you by your health care provider. Make sure you discuss any questions  you have with your health care provider. Document Revised: 09/20/2017 Document Reviewed: 09/20/2017 Elsevier Patient Education  [place].

## 2019-04-13 NOTE — Progress Notes (Signed)
Subjective:    Patient ID: Diane Brooks, female    DOB: 04/07/1955, 64 y.o.   MRN: [id number]  HPI  Pt is a 64 yo female with HTN, T2DM, migraines who presents to the clinic to discuss neck and low back pain that has recently worsened.   Patient is very concerned about her neck and low back pain.  She has had this intermittently in the past.  She has been given muscle relaxers and they have helped.  She has had imaging that showed a lot of arthritis.  She is more concerned because she is now having radiation of numbness and tingling down her left leg and into her heel.  She denies any trauma or injury.  She is not taking anything to make better.  She denies any bowel or bladder dysfunction, saddle anesthesia, leg weakness.  She feels really tight across the whole upper back.  It does seem to radiate down her left side occasionally.  Her neck is less bothersome than her lower spine.  She does feel like her left arm is becoming a little weaker.  Pt also complains of a lot of sinus pressure and pain for the last 7 to 10 days.  She has not really tried anything to make better.  She denies any cough, fever, shortness of breath, sore throat.  Her ears do feel stuffy.  She has a history of sinus infections and wonders if that could be it.  She definitely has a lot of pressure behind her eyes and forehead.  .. Active Ambulatory Problems    Diagnosis Date Noted  . Hyperlipidemia 03/28/2013  . Hiatal hernia 03/28/2013  . Essential hypertension, benign 03/28/2013  . Diabetes mellitus type II, controlled ([place]) 03/28/2013  . GERD (gastroesophageal reflux disease) 05/10/2013  . Globus sensation 05/10/2013  . Atypical chest pain 05/10/2013  . Internal hemorrhoids 05/10/2013  . Decreased libido 05/10/2013  . High risk HPV infection 05/24/2013  . Colon cancer screening 05/25/2013  . Obesity (BMI 30-39.9) 06/27/2013  . Diabetes mellitus with microalbuminuria ([place]) 06/27/2013  . Abnormal weight gain  06/27/2013  . Hyperlipemia 06/27/2013  . Cyst, eyelid sebaceous 06/27/2013  . Onychomycosis 06/27/2013  . 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 Sudoriferous cyst 07/17/2013  . Seasonal allergies 08/03/2013  . Allergic rhinitis 08/03/2013  . Tinea versicolor 08/03/2013  . Obesity, unspecified 10/29/2013  . Hot flashes 10/29/2013  . Abdominal pain, epigastric 11/09/2013  . Migraine headache without aura 10/16/2015  . Mid back pain 10/24/2015  . Left-sided low back pain with left-sided sciatica 10/24/2015  . Neck pain 10/24/2015  . De Quervain's disease (radial styloid tenosynovitis) 11/14/2015  . BPV (benign positional vertigo) 11/14/2015  . Muscle spasm of back 11/14/2015  . Polyarthralgia 11/19/2015  . Mouth sores 12/15/2015  . Black stools 12/15/2015  . Light stools 03/03/2016  . Systolic murmur [id number]  . Low serum vitamin B12 04/20/2017  . Memory changes 04/24/2017  . Itching 06/13/2017  . Anxiety 06/13/2017  . Lipoma of left lower extremity 06/13/2017  . Reactive airway disease 07/03/2017  . Persistent cough for 3 weeks or longer 07/03/2017  . Shortness of breath 07/07/2017  . Myofascial pain syndrome 07/14/2017  . Vitamin D deficiency 09/12/2017  . Visit for screening mammogram 09/12/2017  . Microalbuminuria 09/13/2017  . Hypertension associated with diabetes ([place]) 03/07/2018  . Chronic left-sided low back pain without sciatica 03/09/2018  . Blepharospasm 06/13/2018  . Anxiety as acute reaction to exceptional stress 09/07/2018  . Facet arthritis, degenerative,  lumbar spine 04/16/2019  . DDD (degenerative disc disease), cervical 04/16/2019  . Facet arthritis of cervical region 04/16/2019   Resolved Ambulatory Problems    Diagnosis Date Noted  . No Resolved Ambulatory Problems   Past Medical History:  Diagnosis Date  . Diabetes mellitus without complication ([place])   . Hypertension       Review of Systems See HPI.     Objective:   Physical Exam Vitals  reviewed.  Constitutional:      Appearance: She is obese.  HENT:     Head: Normocephalic.     Comments: Tenderness to palpation over forehead,nasal bridge and maxillary sinuses.     Right Ear: Tympanic membrane normal.     Left Ear: Tympanic membrane normal.     Nose: Nose normal.     Mouth/Throat:     Mouth: Mucous membranes are moist.  Cardiovascular:     Rate and Rhythm: Normal rate.     Pulses: Normal pulses.  Pulmonary:     Effort: Pulmonary effort is normal.     Breath sounds: Normal breath sounds.  Musculoskeletal:     Right lower leg: No edema.     Left lower leg: No edema.     Comments: Normal ROM neck.  Tight paraspinal muscles bilaterally.  No tenderness over c-spine. Tenderness around cspine.  NROM of left shoulder.  Strength 5/5 upper extremity.   ROM at waist normal.  Left positive straight leg test.  Strength 5/5 bilateral lower ext.  Normal DTR 2+.   Neurological:     General: No focal deficit present.     Mental Status: She is alert and oriented to person, place, and time.     Cranial Nerves: No cranial nerve deficit.     Motor: No weakness.     Gait: Gait normal.     Deep Tendon Reflexes: Reflexes normal.  Psychiatric:     Comments: anxious           Assessment & Plan:  [name] KitchenMarland KitchenDaela was seen today for back pain.  Diagnoses and all orders for this visit:  Chronic left-sided low back pain without sciatica -     meloxicam (MOBIC) 15 MG tablet; Take 1 tablet (15 mg total) by mouth daily. -     cyclobenzaprine (FLEXERIL) 10 MG tablet; Take 1 tablet (10 mg total) by mouth 3 (three) times daily as needed for muscle spasms. -     cyclobenzaprine (FLEXERIL) 10 MG tablet; Take 1 tablet (10 mg total) by mouth 3 (three) times daily as needed for muscle spasms. -     predniSONE (DELTASONE) 20 MG tablet; Take 3 tablets for 3 days, take 2 tablets for 3 days, take 1 tablet for 3 days, take 1/2 tablet for 4 days. -     ketorolac (TORADOL) injection 60 mg -      Ambulatory referral to Physical Therapy  Myofascial pain syndrome -     cyclobenzaprine (FLEXERIL) 10 MG tablet; Take 1 tablet (10 mg total) by mouth 3 (three) times daily as needed for muscle spasms. -     cyclobenzaprine (FLEXERIL) 10 MG tablet; Take 1 tablet (10 mg total) by mouth 3 (three) times daily as needed for muscle spasms. -     DULoxetine (CYMBALTA) 30 MG capsule; Take 1 capsule (30 mg total) by mouth daily.  Lumbar radiculopathy -     cyclobenzaprine (FLEXERIL) 10 MG tablet; Take 1 tablet (10 mg total) by mouth 3 (three) times daily as needed for  muscle spasms. -     DG Lumbar Spine Complete -     cyclobenzaprine (FLEXERIL) 10 MG tablet; Take 1 tablet (10 mg total) by mouth 3 (three) times daily as needed for muscle spasms. -     predniSONE (DELTASONE) 20 MG tablet; Take 3 tablets for 3 days, take 2 tablets for 3 days, take 1 tablet for 3 days, take 1/2 tablet for 4 days. -     Ambulatory referral to Physical Therapy  Cervical radiculopathy -     cyclobenzaprine (FLEXERIL) 10 MG tablet; Take 1 tablet (10 mg total) by mouth 3 (three) times daily as needed for muscle spasms. -     DG Cervical Spine Complete -     cyclobenzaprine (FLEXERIL) 10 MG tablet; Take 1 tablet (10 mg total) by mouth 3 (three) times daily as needed for muscle spasms. -     predniSONE (DELTASONE) 20 MG tablet; Take 3 tablets for 3 days, take 2 tablets for 3 days, take 1 tablet for 3 days, take 1/2 tablet for 4 days. -     Ambulatory referral to Physical Therapy  Subacute maxillary sinusitis -     amoxicillin-clavulanate (AUGMENTIN) 875-125 MG tablet; Take 1 tablet by mouth 2 (two) times daily.  DDD (degenerative disc disease), cervical -     Ambulatory referral to Physical Therapy  Facet arthritis of cervical region -     Ambulatory referral to Physical Therapy  Facet arthritis, degenerative, lumbar spine -     Ambulatory referral to Physical Therapy   Hx of some degenerative disc disease. Does seem  like patient has some disc involvement with some radiation/radiculopathy pain.  xrays of neck and lumbar spine ordered.   Today given neck and lumbar spine exercises. Needs formal PT. Will order.  Toradol given IM in office.  Burst of prednisone.  mobic to start daily.  Flexeril as needed.  I do think myofascial pain complicating. Start cymbalta daily.  Follow up in 2 weeks.  If no improvement consider Dr. Darene Lamer visit to consider imaging and more aggressive treatment.   Treated for sinusitis. Symptomatic care discussed. Add flonase as needed.

## 2019-04-16 ENCOUNTER — Encounter: Payer: Self-pay | Admitting: Physician Assistant

## 2019-04-16 DIAGNOSIS — M47816 Spondylosis without myelopathy or radiculopathy, lumbar region: Secondary | ICD-10-CM | POA: Insufficient documentation

## 2019-04-16 DIAGNOSIS — M47812 Spondylosis without myelopathy or radiculopathy, cervical region: Secondary | ICD-10-CM | POA: Insufficient documentation

## 2019-04-16 DIAGNOSIS — M5030 Other cervical disc degeneration, unspecified cervical region: Secondary | ICD-10-CM | POA: Insufficient documentation

## 2019-04-16 NOTE — Progress Notes (Signed)
Aishleen,   Your neck shows lots of arthritis and some disc space narrowing. If PT not working need to see Dr. Darene Lamer for more aggressive treatment and possible MRI.

## 2019-04-16 NOTE — Progress Notes (Signed)
Diane Brooks show arthritis at multiple places in facet. Disc spaces actually look pretty good. Lets start with PT but I would soon make appt with Dr. Darene Lamer if not improved and consider a more aggressive plan.

## 2019-04-16 NOTE — Progress Notes (Signed)
Order has been placed.

## 2019-04-23 ENCOUNTER — Telehealth: Payer: Self-pay

## 2019-04-23 NOTE — Telephone Encounter (Signed)
Diane Brooks called and states she has numbness and tingling in her fingers on her left hand. She also has shoulder and left arm pain. She is very concerned and wanted to know what she should do. I did advise her the next steps was PT and/or schedule an appointment with Dr T. Please advise.

## 2019-04-23 NOTE — Telephone Encounter (Signed)
Symptoms are consistent with radiculopathy. Has she started PT yet? Did she finish prednisone?

## 2019-04-24 NOTE — Telephone Encounter (Signed)
Patient advised. She did finish the prednisone. She didn't do PT due to COVID-19. She has been scheduled with Dr T on Friday.

## 2019-04-27 ENCOUNTER — Ambulatory Visit (INDEPENDENT_AMBULATORY_CARE_PROVIDER_SITE_OTHER): Payer: 59 | Admitting: Sports Medicine

## 2019-04-27 ENCOUNTER — Telehealth: Payer: Self-pay | Admitting: Sports Medicine

## 2019-04-27 ENCOUNTER — Other Ambulatory Visit: Payer: Self-pay

## 2019-04-27 DIAGNOSIS — M5412 Radiculopathy, cervical region: Secondary | ICD-10-CM | POA: Insufficient documentation

## 2019-04-27 DIAGNOSIS — M5416 Radiculopathy, lumbar region: Secondary | ICD-10-CM | POA: Diagnosis not present

## 2019-04-27 DIAGNOSIS — E119 Type 2 diabetes mellitus without complications: Secondary | ICD-10-CM

## 2019-04-27 MED ORDER — HYDROCODONE-ACETAMINOPHEN 5-325 MG PO TABS: 1.0000 | ORAL_TABLET | Freq: Three times a day (TID) | ORAL | 0 refills | Status: DC | PRN

## 2019-04-27 MED ORDER — ONGLYZA 2.5 MG PO TABS: 2.5000 mg | ORAL_TABLET | Freq: Every day | ORAL | 1 refills | Status: DC

## 2019-04-27 NOTE — Telephone Encounter (Signed)
Patient called and [facility] told her that she is not able to get her Hydrocodone. I called [facility] to cancel the Rx for Hydrocodone at [facility]. Please send to [facility] at they have this in stock.

## 2019-04-27 NOTE — Assessment & Plan Note (Signed)
Diane Brooks is a 64 year old female, she is very anxious and very histrionic. She has classic left C6 and C8 distribution radiculitis with DDD seen on x-rays. I explained to her that we would treat her condition conservatively, but that she had a good likelihood of improving. Starting with formal physical therapy, and a short course of hydrocodone for symptom relief. NSAIDs, analgesics so far have not been effective. Prednisone was not effective. There is certainly a degree of myofascial pain syndrome and symptom exaggeration so I do suspect in the future we will have to increase her neuropathic agents.

## 2019-04-27 NOTE — Telephone Encounter (Signed)
Patient will call pharmacy this evening for her medication. No other questions.

## 2019-04-27 NOTE — Telephone Encounter (Signed)
Done

## 2019-04-27 NOTE — Progress Notes (Signed)
    Procedures performed today:    None.  Independent interpretation of tests performed by another provider:   Cervical and lumbar spine x-rays reviewed and show multilevel DDD.  Impression and Recommendations:    Radiculitis of left cervical region Diane Brooks is a 64 year old female, she is very anxious and very histrionic. She has classic left C6 and C8 distribution radiculitis with DDD seen on x-rays. I explained to her that we would treat her condition conservatively, but that she had a good likelihood of improving. Starting with formal physical therapy, and a short course of hydrocodone for symptom relief. NSAIDs, analgesics so far have not been effective. Prednisone was not effective. There is certainly a degree of myofascial pain syndrome and symptom exaggeration so I do suspect in the future we will have to increase her neuropathic agents.  Left lumbar radiculitis Diane Brooks is a 64 year old female, she is very anxious and very histrionic. She also has classic left L4 distribution radiculitis with DDD seen on lumbar spine x-rays. I explained to her that we would treat her condition conservatively, but that she had a good likelihood of improving. Starting with formal physical therapy, and a short course of hydrocodone for symptom relief. NSAIDs, analgesics so far have not been effective. Prednisone was not effective. There is certainly a degree of myofascial pain syndrome and symptom exaggeration so I do suspect in the future we will have to increase her neuropathic agents. Follow-up in 4 to 6 weeks, we will proceed with MRI of the cervical and lumbar spine for interventional planning if no better.    ___________________________________________ Gwen Her. Dianah Field, M.D., ABFM., CAQSM. Primary Care and [place] Instructor of [place] [employer] [facility] of Medicine

## 2019-04-27 NOTE — Assessment & Plan Note (Signed)
Diane Brooks is a 64 year old female, she is very anxious and very histrionic. She also has classic left L4 distribution radiculitis with DDD seen on lumbar spine x-rays. I explained to her that we would treat her condition conservatively, but that she had a good likelihood of improving. Starting with formal physical therapy, and a short course of hydrocodone for symptom relief. NSAIDs, analgesics so far have not been effective. Prednisone was not effective. There is certainly a degree of myofascial pain syndrome and symptom exaggeration so I do suspect in the future we will have to increase her neuropathic agents. Follow-up in 4 to 6 weeks, we will proceed with MRI of the cervical and lumbar spine for interventional planning if no better.

## 2019-04-30 ENCOUNTER — Other Ambulatory Visit: Payer: Self-pay

## 2019-04-30 ENCOUNTER — Ambulatory Visit (INDEPENDENT_AMBULATORY_CARE_PROVIDER_SITE_OTHER): Payer: 59 | Admitting: Physician Assistant

## 2019-04-30 ENCOUNTER — Ambulatory Visit: Payer: Self-pay | Admitting: Physical Therapy

## 2019-04-30 VITALS — BP 137/65 | HR 84 | Ht 64.0 in | Wt 172.0 lb

## 2019-04-30 DIAGNOSIS — E119 Type 2 diabetes mellitus without complications: Secondary | ICD-10-CM

## 2019-04-30 DIAGNOSIS — M5416 Radiculopathy, lumbar region: Secondary | ICD-10-CM

## 2019-04-30 DIAGNOSIS — M5412 Radiculopathy, cervical region: Secondary | ICD-10-CM | POA: Diagnosis not present

## 2019-04-30 DIAGNOSIS — M7918 Myalgia, other site: Secondary | ICD-10-CM | POA: Diagnosis not present

## 2019-04-30 MED ORDER — OXYCODONE-ACETAMINOPHEN 5-325 MG PO TABS: 1.0000 | ORAL_TABLET | Freq: Four times a day (QID) | ORAL | 0 refills | Status: AC | PRN

## 2019-04-30 MED ORDER — GABAPENTIN 100 MG PO CAPS: ORAL_CAPSULE | ORAL | 1 refills | Status: DC

## 2019-04-30 MED ORDER — OXYCODONE-ACETAMINOPHEN 5-325 MG PO TABS: 1.0000 | ORAL_TABLET | Freq: Four times a day (QID) | ORAL | 0 refills | Status: DC | PRN

## 2019-04-30 NOTE — Progress Notes (Signed)
Subjective:    Patient ID: Diane Brooks, female    DOB: 1956/03/19, 64 y.o.   MRN: [id number]  HPI  Patient is a 64 year old female with type 2 diabetes, hypertension who has recently been diagnosed with some left cervical and lumbar radiculitis.  Patient is extremely frustrated today.  She saw Dr. Verneda Skill days ago and was given some hydrocodone which she does not feel like it is working around.  Encouraged her to continue treatment plan with physical therapy and follow-up in 4 to 6 weeks for potential MRI and injection planning.  Patient states she cannot wait that long.  She cannot sleep she cannot focus on anything other than pain. She denies any bowel or bladder dysfunction, saddle anesthesia, leg weakness. Insurance would not pay for PT here in [place] but found a place at [facility] which will do it but she needs referral. Pt also never started cymbalta.   Pt is also having trouble getting her onyglza approved. She cannot tolerate [place] and been on for years. She will have to pay 500 dollars if we do not get PA through.   .. Active Ambulatory Problems    Diagnosis Date Noted  . Hyperlipidemia 03/28/2013  . Hiatal hernia 03/28/2013  . Essential hypertension, benign 03/28/2013  . Diabetes mellitus type II, controlled ([place]) 03/28/2013  . GERD (gastroesophageal reflux disease) 05/10/2013  . Globus sensation 05/10/2013  . Atypical chest pain 05/10/2013  . Internal hemorrhoids 05/10/2013  . Decreased libido 05/10/2013  . High risk HPV infection 05/24/2013  . Colon cancer screening 05/25/2013  . Obesity (BMI 30-39.9) 06/27/2013  . Diabetes mellitus with microalbuminuria ([place]) 06/27/2013  . Abnormal weight gain 06/27/2013  . Hyperlipemia 06/27/2013  . Cyst, eyelid sebaceous 06/27/2013  . Onychomycosis 06/27/2013  . 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 Sudoriferous cyst 07/17/2013  . Seasonal allergies 08/03/2013  . Allergic rhinitis 08/03/2013  . Tinea versicolor 08/03/2013  .  Obesity, unspecified 10/29/2013  . Hot flashes 10/29/2013  . Abdominal pain, epigastric 11/09/2013  . Migraine headache without aura 10/16/2015  . De Quervain's disease (radial styloid tenosynovitis) 11/14/2015  . BPV (benign positional vertigo) 11/14/2015  . Polyarthralgia 11/19/2015  . Mouth sores 12/15/2015  . Black stools 12/15/2015  . Light stools 03/03/2016  . Systolic murmur [id number]  . Low serum vitamin B12 04/20/2017  . Memory changes 04/24/2017  . Itching 06/13/2017  . Anxiety 06/13/2017  . Lipoma of left lower extremity 06/13/2017  . Reactive airway disease 07/03/2017  . Persistent cough for 3 weeks or longer 07/03/2017  . Shortness of breath 07/07/2017  . Myofascial pain syndrome 07/14/2017  . Vitamin D deficiency 09/12/2017  . Visit for screening mammogram 09/12/2017  . Microalbuminuria 09/13/2017  . Hypertension associated with diabetes ([place]) 03/07/2018  . Blepharospasm 06/13/2018  . Anxiety as acute reaction to exceptional stress 09/07/2018  . Facet arthritis, degenerative, lumbar spine 04/16/2019  . Left lumbar radiculitis 04/27/2019  . Radiculitis of left cervical region 04/27/2019   Resolved Ambulatory Problems    Diagnosis Date Noted  . Mid back pain 10/24/2015  . Left-sided low back pain with left-sided sciatica 10/24/2015  . Neck pain 10/24/2015  . Muscle spasm of back 11/14/2015  . Chronic left-sided low back pain without sciatica 03/09/2018  . DDD (degenerative disc disease), cervical 04/16/2019  . Facet arthritis of cervical region 04/16/2019   Past Medical History:  Diagnosis Date  . Diabetes mellitus without complication ([place])   . Hypertension        Review  of Systems   see HPI.  Objective:   Physical Exam Vitals reviewed.  Constitutional:      Comments: Pt is pacing around the room.   Cardiovascular:     Rate and Rhythm: Normal rate.  Pulmonary:     Effort: Pulmonary effort is normal.  Musculoskeletal:     Comments: extremely  tender over back, neck, left lower extremity to any touch deep or light.   Neurological:     General: No focal deficit present.     Mental Status: She is alert and oriented to person, place, and time.  Psychiatric:     Comments: Very emotional.            Assessment & Plan:  [name] KitchenMarland KitchenGreenlee was seen today for follow-up.  Diagnoses and all orders for this visit:  Left lumbar radiculitis -     gabapentin (NEURONTIN) 100 MG capsule; Take one tablet for 3 days at bedtime then increase by one tablet every 3 days until taking one tablet three times a day. -     oxyCODONE-acetaminophen (PERCOCET/ROXICET) 5-325 MG tablet; Take 1 tablet by mouth every 6 (six) hours as needed for up to 5 days for severe pain. -     Ambulatory referral to Physical Therapy  Radiculitis of left cervical region -     gabapentin (NEURONTIN) 100 MG capsule; Take one tablet for 3 days at bedtime then increase by one tablet every 3 days until taking one tablet three times a day. -     oxyCODONE-acetaminophen (PERCOCET/ROXICET) 5-325 MG tablet; Take 1 tablet by mouth every 6 (six) hours as needed for up to 5 days for severe pain. -     Ambulatory referral to Physical Therapy  Myofascial pain syndrome -     gabapentin (NEURONTIN) 100 MG capsule; Take one tablet for 3 days at bedtime then increase by one tablet every 3 days until taking one tablet three times a day.  Controlled type 2 diabetes mellitus without complication, without long-term current use of insulin ([facility])  Other orders -     Discontinue: oxyCODONE-acetaminophen (PERCOCET/ROXICET) 5-325 MG tablet; Take 1 tablet by mouth every 6 (six) hours as needed for up to 5 days for severe pain.   PA had not been started on onglyza. We called and started that. A1C very controlled. Ok to not get filled until PA goes through. Called for samples and will be here in a week. Stay on metformin.   Pt needs to start cymbalta and gabapentin. Discussed how to use. Referral given  for other PT group. Reassured and discussed nature of pain and symptoms. If no improvement in next 2 weeks could consider MRI sooner. Continue conservative treatment. Stop norco. Given oxycodone to try for pain at night. Pt is not sleeping due to pain.  [name] Kitchen.PDMP reviewed during this encounter. No concerns.   Spent 45 minutes with patient and coordinated care.

## 2019-04-30 NOTE — Patient Instructions (Signed)
I need you to start duloxetine(cymbalta 30mg ) AND  gabaptentin 100mg  titrate up to three times a day increase by 1 tablet every 3 days.

## 2019-05-01 ENCOUNTER — Encounter: Payer: Self-pay | Admitting: Physician Assistant

## 2019-05-01 ENCOUNTER — Telehealth: Payer: Self-pay | Admitting: Physician Assistant

## 2019-05-01 NOTE — Telephone Encounter (Signed)
Spoke with [facility] - they needed approval to fill oxycodone written yesterday. Gave approval. Aware it is replacing hydrocodone that was written for 5 day supply.   Called [place] and made her aware that Onglyza approved. [place].

## 2019-05-01 NOTE — Telephone Encounter (Signed)
Thank you :)

## 2019-05-01 NOTE — Telephone Encounter (Signed)
Received fax from [place] for medication exception on Onglyza and it has been approved as of 04/30/2019.  Valid 04/30/19 - 04/29/20 - CF

## 2019-05-02 ENCOUNTER — Other Ambulatory Visit: Payer: Self-pay

## 2019-05-02 DIAGNOSIS — E119 Type 2 diabetes mellitus without complications: Secondary | ICD-10-CM

## 2019-05-02 MED ORDER — ONGLYZA 2.5 MG PO TABS: 2.5000 mg | ORAL_TABLET | Freq: Every day | ORAL | 1 refills | Status: DC

## 2019-05-07 ENCOUNTER — Telehealth: Payer: Self-pay | Admitting: Physician Assistant

## 2019-05-07 MED ORDER — TRAMADOL HCL 50 MG PO TABS: ORAL_TABLET | ORAL | 0 refills | Status: DC

## 2019-05-07 NOTE — Telephone Encounter (Signed)
Sent tramadol 1-2 tablets every 12 hours as needed for pain. She does need to continue to take cymbalta with this.

## 2019-05-07 NOTE — Telephone Encounter (Signed)
Patient stated she has not been on this medication in a while but is in a lot of pain and would like to get back on Tramadol 50mg  until she starts PT

## 2019-05-07 NOTE — Telephone Encounter (Signed)
We can start that but she cannot take with norco or oxycodone given. Make sure she is aware.

## 2019-05-07 NOTE — Telephone Encounter (Signed)
Spoke with patient. She is no longer taking Oxycodone or Hydrocodone, she states they aren't working. She can't get into physical therapy for two weeks because of insurance problem. She does want to start Tramadol. Please send.

## 2019-05-16 ENCOUNTER — Ambulatory Visit (INDEPENDENT_AMBULATORY_CARE_PROVIDER_SITE_OTHER): Payer: 59 | Admitting: Physician Assistant

## 2019-05-16 ENCOUNTER — Telehealth: Payer: Self-pay

## 2019-05-16 VITALS — BP 146/79 | HR 77 | Ht 64.0 in | Wt 165.0 lb

## 2019-05-16 DIAGNOSIS — R32 Unspecified urinary incontinence: Secondary | ICD-10-CM | POA: Diagnosis not present

## 2019-05-16 DIAGNOSIS — M5416 Radiculopathy, lumbar region: Secondary | ICD-10-CM

## 2019-05-16 DIAGNOSIS — F411 Generalized anxiety disorder: Secondary | ICD-10-CM | POA: Diagnosis not present

## 2019-05-16 DIAGNOSIS — F430 Acute stress reaction: Secondary | ICD-10-CM

## 2019-05-16 NOTE — Patient Instructions (Signed)
GET MRI will call with results.

## 2019-05-16 NOTE — Telephone Encounter (Signed)
Low back pain symptom onset 04/13/19. Patient has tried and failed treatment with NSAID, home exercises, and formal physical therapy. Patient presents to office today with worsening and radiating pain down left side of body. Patient has loss of bladder control.   STAT MRI ordered, crucial patient be seen today for imaging to determine next steps for treatment.

## 2019-05-16 NOTE — Progress Notes (Signed)
Subjective:    Patient ID: Diane Brooks, female    DOB: Dec 16, 1955, 64 y.o.   MRN: [id number]  HPI  Pt is a 64 yo female who presents to the clinic to follow up on lumbar radiculitis. Her pain is the "the same or worst". She is "spending all her money and no answers". She did one PT session but made pain worse. She does not want to go back. She cannot sleep or do anything because of the pain. Denies any leg weakness or saddle anesthesia. She did say she lost some control of her bladder once. Pain is running down on her left side through her butt, hips, anterior leg and into her left great toe. Nothing is helping: however she is not taking cymbalta or gabapentin. Pain medications do not touch it.   .. Active Ambulatory Problems    Diagnosis Date Noted  . Hyperlipidemia 03/28/2013  . Hiatal hernia 03/28/2013  . Essential hypertension, benign 03/28/2013  . Diabetes mellitus type II, controlled ([place]) 03/28/2013  . GERD (gastroesophageal reflux disease) 05/10/2013  . Globus sensation 05/10/2013  . Atypical chest pain 05/10/2013  . Internal hemorrhoids 05/10/2013  . Decreased libido 05/10/2013  . High risk HPV infection 05/24/2013  . Colon cancer screening 05/25/2013  . Obesity (BMI 30-39.9) 06/27/2013  . Diabetes mellitus with microalbuminuria ([place]) 06/27/2013  . Abnormal weight gain 06/27/2013  . Hyperlipemia 06/27/2013  . Cyst, eyelid sebaceous 06/27/2013  . Onychomycosis 06/27/2013  . 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 Sudoriferous cyst 07/17/2013  . Seasonal allergies 08/03/2013  . Allergic rhinitis 08/03/2013  . Tinea versicolor 08/03/2013  . Obesity, unspecified 10/29/2013  . Hot flashes 10/29/2013  . Abdominal pain, epigastric 11/09/2013  . Migraine headache without aura 10/16/2015  . De Quervain's disease (radial styloid tenosynovitis) 11/14/2015  . BPV (benign positional vertigo) 11/14/2015  . Polyarthralgia 11/19/2015  . Mouth sores 12/15/2015  . Black stools  12/15/2015  . Light stools 03/03/2016  . Systolic murmur [id number]  . Low serum vitamin B12 04/20/2017  . Memory changes 04/24/2017  . Itching 06/13/2017  . Anxiety 06/13/2017  . Lipoma of left lower extremity 06/13/2017  . Reactive airway disease 07/03/2017  . Persistent cough for 3 weeks or longer 07/03/2017  . Shortness of breath 07/07/2017  . Myofascial pain syndrome 07/14/2017  . Vitamin D deficiency 09/12/2017  . Visit for screening mammogram 09/12/2017  . Microalbuminuria 09/13/2017  . Hypertension associated with diabetes ([place]) 03/07/2018  . Blepharospasm 06/13/2018  . Anxiety as acute reaction to exceptional stress 09/07/2018  . Facet arthritis, degenerative, lumbar spine 04/16/2019  . Left lumbar radiculitis 04/27/2019  . Radiculitis of left cervical region 04/27/2019  . Urinary incontinence 05/18/2019   Resolved Ambulatory Problems    Diagnosis Date Noted  . Mid back pain 10/24/2015  . Left-sided low back pain with left-sided sciatica 10/24/2015  . Neck pain 10/24/2015  . Muscle spasm of back 11/14/2015  . Chronic left-sided low back pain without sciatica 03/09/2018  . DDD (degenerative disc disease), cervical 04/16/2019  . Facet arthritis of cervical region 04/16/2019   Past Medical History:  Diagnosis Date  . Diabetes mellitus without complication ([place])   . Hypertension     Review of Systems See HPI.     Objective:   Physical Exam Vitals reviewed.  Constitutional:      Comments: Very distraught. wearing pajamas/hair not fixed/no makeup.   Cardiovascular:     Rate and Rhythm: Normal rate.  Musculoskeletal:     Comments: Not  able to perform PE. All light to deep touch hurts. She grimace in pain with even the lightest touch.  Her bilateral leg strength was 5/5.   Psychiatric:     Comments: Histrionic/anxious/irritatible.            Assessment & Plan:  [name] KitchenMarland KitchenBettyjane was seen today for pain.  Diagnoses and all orders for this visit:  Left lumbar  radiculitis -     MR Lumbar Spine Wo Contrast  Urinary incontinence, unspecified type -     MR Lumbar Spine Wo Contrast  Anxiety as acute reaction to exceptional stress  Other orders -     diazepam (VALIUM) 5 MG tablet; Take 1 tab PO 2 hours before procedure or imaging.   Next step is to move forward with imagining. MRI ordered. Pt has not started cymbalta or gabapentin enough to help. Start with cymbalta and told her she must take daily. Continue with PT. Continue with conservative treatment. Valium given to use before MRI. Pt is very anxious and upset about pain and money being spent. Reassured and made new plan today. I do think there is some myofascial pain variant.

## 2019-05-17 MED ORDER — DIAZEPAM 5 MG PO TABS: ORAL_TABLET | ORAL | 0 refills | Status: DC

## 2019-05-18 ENCOUNTER — Encounter: Payer: Self-pay | Admitting: Physician Assistant

## 2019-05-18 ENCOUNTER — Other Ambulatory Visit: Payer: Self-pay | Admitting: Physician Assistant

## 2019-05-18 ENCOUNTER — Ambulatory Visit: Payer: 59 | Admitting: Physician Assistant

## 2019-05-18 ENCOUNTER — Telehealth: Payer: Self-pay | Admitting: Neurology

## 2019-05-18 DIAGNOSIS — R32 Unspecified urinary incontinence: Secondary | ICD-10-CM | POA: Insufficient documentation

## 2019-05-18 MED ORDER — LORAZEPAM 0.5 MG PO TABS: 0.5000 mg | ORAL_TABLET | Freq: Two times a day (BID) | ORAL | 0 refills | Status: DC | PRN

## 2019-05-18 NOTE — Progress Notes (Signed)
Pt is having a lot of anxiety with pain. Sent ativan to use as needed for anxiety. START cymbalta and gabapentin which can also help. Use sparingly.

## 2019-05-18 NOTE — Telephone Encounter (Signed)
Spoke with patient. She is having panic attacks and would like medication.  Lorazepam sent to use sparingly per [facility]. She needs to start Cymbalta/Gabapentin which will also help with mood.  Patient expresses understanding.  She needs to have MRI done. She has contact information to reschedule MR. Appt for today cancelled.

## 2019-05-21 ENCOUNTER — Telehealth: Payer: Self-pay | Admitting: Physician Assistant

## 2019-05-21 DIAGNOSIS — M47816 Spondylosis without myelopathy or radiculopathy, lumbar region: Secondary | ICD-10-CM

## 2019-05-21 DIAGNOSIS — M51369 Other intervertebral disc degeneration, lumbar region without mention of lumbar back pain or lower extremity pain: Secondary | ICD-10-CM | POA: Insufficient documentation

## 2019-05-21 DIAGNOSIS — M5136 Other intervertebral disc degeneration, lumbar region: Secondary | ICD-10-CM | POA: Insufficient documentation

## 2019-05-21 DIAGNOSIS — M5416 Radiculopathy, lumbar region: Secondary | ICD-10-CM

## 2019-05-21 DIAGNOSIS — M5126 Other intervertebral disc displacement, lumbar region: Secondary | ICD-10-CM | POA: Insufficient documentation

## 2019-05-21 NOTE — Telephone Encounter (Signed)
Patient made aware of results/recommendations. She is agreeable to see Orthopedic. She has appt scheduled 05/29/2019.  She also wanted to make [place] aware she went home Friday and had one episode of vomiting. She was worried about Covid. No other symptoms. Patient fine now. Made her aware it does not sound like Covid. She expressed appreciation.

## 2019-05-21 NOTE — Telephone Encounter (Signed)
MRI showed:  Right small disc protrusion at L4/L5.  Left annular fissure.  Pt is having left sided pain.  Nothing urgent but referral placed to orthopedic specialist.

## 2019-05-25 ENCOUNTER — Ambulatory Visit: Payer: 59 | Admitting: Sports Medicine

## 2019-05-29 ENCOUNTER — Encounter: Payer: Self-pay | Admitting: Orthopaedic Surgery

## 2019-05-29 ENCOUNTER — Ambulatory Visit (INDEPENDENT_AMBULATORY_CARE_PROVIDER_SITE_OTHER): Payer: 59 | Admitting: Orthopaedic Surgery

## 2019-05-29 ENCOUNTER — Other Ambulatory Visit: Payer: Self-pay

## 2019-05-29 VITALS — BP 117/68 | HR 81 | Ht 63.0 in | Wt 169.0 lb

## 2019-05-29 DIAGNOSIS — M7062 Trochanteric bursitis, left hip: Secondary | ICD-10-CM | POA: Diagnosis not present

## 2019-05-29 MED ORDER — BUPIVACAINE HCL 0.25 % IJ SOLN
2.0000 mL | INTRAMUSCULAR | Status: AC | PRN
  Administered 2019-05-29: 2 mL via INTRA_ARTICULAR

## 2019-05-29 MED ORDER — LIDOCAINE HCL 1 % IJ SOLN
0.5000 mL | INTRAMUSCULAR | Status: AC | PRN
  Administered 2019-05-29: .5 mL

## 2019-05-29 MED ORDER — METHYLPREDNISOLONE ACETATE 40 MG/ML IJ SUSP
40.0000 mg | INTRAMUSCULAR | Status: AC | PRN
  Administered 2019-05-29: 40 mg via INTRA_ARTICULAR

## 2019-05-29 NOTE — Progress Notes (Signed)
Office Visit Note   Patient: Diane Brooks           Date of Birth: 1955-06-19           MRN: [id number] Visit Date: 05/29/2019              Requested by: Donella Stade, PA-C [address] [place],  [place] [id number] PCP: Donella Stade, PA-C   Assessment & Plan: Visit Diagnoses:  1. Trochanteric bursitis, left hip     Plan: Left trochanteric injection performed.  Post injection she states she got relief of her lateral hip pain and also nonphysiologically said the numbness and tingling in her foot had completely been relieved.  She is happy with the results of the injection and will return in 3 weeks.  We discussed the spurring in her neck at C4-5 and C5-6 with absence of radiculopathy findings on today's exam.  She did do well with a trochanteric injection.  Follow-Up Instructions: Return in about 3 weeks (around 06/19/2019).   Orders:  Orders Placed This Encounter  Procedures  . Large Joint Inj   No orders of the defined types were placed in this encounter.     Procedures: Large Joint Inj on 05/29/2019 2:35 PM Details: lateral approach Medications: 0.5 mL lidocaine 1 %; 2 mL bupivacaine 0.25 %; 40 mg methylPREDNISolone acetate 40 MG/ML      Clinical Data: No additional findings.   Subjective: Chief Complaint  Patient presents with  . Lower Back - Pain  . Neck - Pain    HPI 63 year old female seen with problems with back pain and neck pain.  Back pain lower back she has had an episode started in November normally she has an episode that bothers her for 2 or 3days treated with anti-inflammatory sometimes with muscle relaxants and resolution of her symptoms.  States she has had this problem since November or December and has been chronic.  She has had multiple rounds of medication pain medication narcotics and sedatives and now her [place] wrist score is 400.  She has had hydrocodone, oxycodone, tramadol 120.  Also lorazepam.  C-spine x-rays  demonstrate spondylosis to space narrowing at C4-5 C5-6 with normal curvature and uncovertebral changes.  Lumbar MRI scan showed some disc protrusion at the L4-5 level but this is on the right side opposite where she has left hip and leg symptoms.  She states she also has pain in her left arm with numbness and tingling noted in the first radial 3 fingers.  She states nothing is helped her and she points to the superior more medial border of both scapulas where she has significant pain.  Review of Systems positive for anxiety, depression.  Neck and back pain problems. Diabetes on oral medication hyperlipidemia.  Otherwise negative as pertains to HPI.  Objective: Vital Signs: BP 117/68   Pulse 81   Ht 5\' 3"  (1.6 m)   Wt 169 lb (76.7 kg)   BMI 29.94 kg/m   Physical Exam Constitutional:      Appearance: She is well-developed.  HENT:     Head: Normocephalic.     Right Ear: External ear normal.     Left Ear: External ear normal.  Eyes:     Pupils: Pupils are equal, round, and reactive to light.  Neck:     Thyroid: No thyromegaly.     Trachea: No tracheal deviation.  Cardiovascular:     Rate and Rhythm: Normal rate.  Pulmonary:  Effort: Pulmonary effort is normal.  Abdominal:     Palpations: Abdomen is soft.  Skin:    General: Skin is warm and dry.  Neurological:     Mental Status: She is alert and oriented to person, place, and time.  Psychiatric:        Behavior: Behavior normal.     Ortho Exam patient has mild tenderness of the brachial plexus more on the left than right which seems to be relieved with distraction.  She has tenderness of the trapezial muscles withdraws with palpation superior medial border of the scapula more on the left than right.  No coracoid tenderness letter epicondyles nontender she has significant tenderness over the mid dorsal forearm.  Pronation supination is normal upper extremity reflexes are 1+ and symmetrical lower extremity reflexes are 1+ and  symmetrical negative straight leg raising on the left at 90 degrees.  Negative logroll to the hip she has exquisite tenderness over the left trochanter none on the right and some sciatic notch tenderness on the left anterior tib EHL is strong.  Specialty Comments:  No specialty comments available.  Imaging: MRI SpineLumbar WO IV Contrast3/03/2019 [facility] Result Impression  IMPRESSION: Mild degenerative disc disease and facet arthrosis as described above. This is most notable for a small RIGHT foraminal disc protrusion at L4-L5 producing mild-to-moderate RIGHT neuroforaminal stenosis with disc material adjacent to the exiting nerve root in the neuroforamen.  Electronically Signed by: Bryon Lions      PMFS History: Patient Active Problem List   Diagnosis Date Noted  . Trochanteric bursitis, left hip 05/29/2019  . Protrusion of lumbar intervertebral disc 05/21/2019  . DDD (degenerative disc disease), lumbar 05/21/2019  . Urinary incontinence 05/18/2019  . Left lumbar radiculitis 04/27/2019  . Radiculitis of left cervical region 04/27/2019  . Facet arthritis, degenerative, lumbar spine 04/16/2019  . Spondylosis without myelopathy or radiculopathy, cervical region 04/16/2019  . Anxiety as acute reaction to exceptional stress 09/07/2018  . Blepharospasm 06/13/2018  . Hypertension associated with diabetes ([place]) 03/07/2018  . Microalbuminuria 09/13/2017  . Vitamin D deficiency 09/12/2017  . Visit for screening mammogram 09/12/2017  . Myofascial pain syndrome 07/14/2017  . Shortness of breath 07/07/2017  . Reactive airway disease 07/03/2017  . Persistent cough for 3 weeks or longer 07/03/2017  . Itching 06/13/2017  . Anxiety 06/13/2017  . Lipoma of left lower extremity 06/13/2017  . Memory changes 04/24/2017  . Low serum vitamin B12 04/20/2017  . Systolic murmur [id number]  . Light stools 03/03/2016  . Mouth sores 12/15/2015  . Black stools 12/15/2015  . Polyarthralgia  11/19/2015  . De Quervain's disease (radial styloid tenosynovitis) 11/14/2015  . BPV (benign positional vertigo) 11/14/2015  . Migraine headache without aura 10/16/2015  . Abdominal pain, epigastric 11/09/2013  . Obesity, unspecified 10/29/2013  . Hot flashes 10/29/2013  . Seasonal allergies 08/03/2013  . Allergic rhinitis 08/03/2013  . Tinea versicolor 08/03/2013  . Sudoriferous cyst 07/17/2013  . 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 Obesity (BMI 30-39.9) 06/27/2013  . Diabetes mellitus with microalbuminuria ([place]) 06/27/2013  . Abnormal weight gain 06/27/2013  . Hyperlipemia 06/27/2013  . Cyst, eyelid sebaceous 06/27/2013  . Onychomycosis 06/27/2013  . Colon cancer screening 05/25/2013  . High risk HPV infection 05/24/2013  . GERD (gastroesophageal reflux disease) 05/10/2013  . Globus sensation 05/10/2013  . Atypical chest pain 05/10/2013  . Internal hemorrhoids 05/10/2013  . Decreased libido 05/10/2013  . Hyperlipidemia 03/28/2013  . Hiatal hernia 03/28/2013  . Essential hypertension, benign 03/28/2013  .  Diabetes mellitus type II, controlled ([place]) 03/28/2013   Past Medical History:  Diagnosis Date  . Diabetes mellitus without complication ([place])   . Hyperlipidemia   . Hypertension     Family History  Problem Relation Age of Onset  . Hyperlipidemia Mother   . Stroke Mother   . Diabetes Father   . Hyperlipidemia Father   . Hypertension Father   . Diabetes Maternal Grandfather   . Hyperlipidemia Maternal Grandfather   . Diabetes Paternal Grandfather   . Hyperlipidemia Paternal Grandfather     Past Surgical History:  Procedure Laterality Date  . BREAST ENHANCEMENT SURGERY    . ESOPHAGEAL MANOMETRY N/A 07/02/2013   Procedure: ESOPHAGEAL MANOMETRY (EM);  Surgeon: Jerene Bears, MD;  Location: [facility];  Service: Gastroenterology;  Laterality: N/A;  . TONSILECTOMY/ADENOIDECTOMY WITH MYRINGOTOMY    . TUBAL LIGATION    . tummy tuck     Social History   Occupational  History  . Occupation: Retired  Tobacco Use  . Smoking status: Former Smoker    Quit date: 03/23/1991    Years since quitting: 28.2  . Smokeless tobacco: Never Used  Substance and Sexual Activity  . Alcohol use: No  . Drug use: No  . Sexual activity: Yes    Birth control/protection: Surgical, Post-menopausal

## 2019-06-04 ENCOUNTER — Encounter: Payer: Self-pay | Admitting: Physician Assistant

## 2019-06-04 ENCOUNTER — Ambulatory Visit (INDEPENDENT_AMBULATORY_CARE_PROVIDER_SITE_OTHER): Payer: 59 | Admitting: Physician Assistant

## 2019-06-04 VITALS — BP 141/59 | HR 87 | Ht 63.0 in | Wt 165.0 lb

## 2019-06-04 DIAGNOSIS — M7062 Trochanteric bursitis, left hip: Secondary | ICD-10-CM | POA: Diagnosis not present

## 2019-06-04 DIAGNOSIS — M5416 Radiculopathy, lumbar region: Secondary | ICD-10-CM

## 2019-06-04 NOTE — Patient Instructions (Addendum)
Gabapentin 1-3 tablets up to three times a day.  Tylenol/benadryl for sleep.

## 2019-06-04 NOTE — Progress Notes (Signed)
Subjective:    Patient ID: Diane Brooks, female    DOB: 1955-11-27, 64 y.o.   MRN: [id number]  HPI  Pt is a 64 yo female with 4 month history of upper and lower back pain she was recently seen by Dr. Lorin Mercy orthopedic surgery who thought she had more trochateric bursitis rather than left lumbar radiculitis. Injection was given and patient reports 1 week out 60 percent pain relief. She is doing much better. She is taking gabapentin three times a day. She brings in OTC med that she takes at night to help her sleep and wonders if she can keep taking it. It helps a lot.   .. Active Ambulatory Problems    Diagnosis Date Noted  . Hyperlipidemia 03/28/2013  . Hiatal hernia 03/28/2013  . Essential hypertension, benign 03/28/2013  . Diabetes mellitus type II, controlled ([place]) 03/28/2013  . GERD (gastroesophageal reflux disease) 05/10/2013  . Globus sensation 05/10/2013  . Atypical chest pain 05/10/2013  . Internal hemorrhoids 05/10/2013  . Decreased libido 05/10/2013  . High risk HPV infection 05/24/2013  . Colon cancer screening 05/25/2013  . Obesity (BMI 30-39.9) 06/27/2013  . Diabetes mellitus with microalbuminuria ([place]) 06/27/2013  . Abnormal weight gain 06/27/2013  . Hyperlipemia 06/27/2013  . Cyst, eyelid sebaceous 06/27/2013  . Onychomycosis 06/27/2013  . 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 Sudoriferous cyst 07/17/2013  . Seasonal allergies 08/03/2013  . Allergic rhinitis 08/03/2013  . Tinea versicolor 08/03/2013  . Obesity, unspecified 10/29/2013  . Hot flashes 10/29/2013  . Abdominal pain, epigastric 11/09/2013  . Migraine headache without aura 10/16/2015  . De Quervain's disease (radial styloid tenosynovitis) 11/14/2015  . BPV (benign positional vertigo) 11/14/2015  . Polyarthralgia 11/19/2015  . Mouth sores 12/15/2015  . Black stools 12/15/2015  . Light stools 03/03/2016  . Systolic murmur [id number]  . Low serum vitamin B12 04/20/2017  . Memory changes 04/24/2017  .  Itching 06/13/2017  . Anxiety 06/13/2017  . Lipoma of left lower extremity 06/13/2017  . Reactive airway disease 07/03/2017  . Persistent cough for 3 weeks or longer 07/03/2017  . Shortness of breath 07/07/2017  . Myofascial pain syndrome 07/14/2017  . Vitamin D deficiency 09/12/2017  . Visit for screening mammogram 09/12/2017  . Microalbuminuria 09/13/2017  . Hypertension associated with diabetes ([place]) 03/07/2018  . Blepharospasm 06/13/2018  . Anxiety as acute reaction to exceptional stress 09/07/2018  . Facet arthritis, degenerative, lumbar spine 04/16/2019  . Spondylosis without myelopathy or radiculopathy, cervical region 04/16/2019  . Left lumbar radiculitis 04/27/2019  . Radiculitis of left cervical region 04/27/2019  . Urinary incontinence 05/18/2019  . Protrusion of lumbar intervertebral disc 05/21/2019  . DDD (degenerative disc disease), lumbar 05/21/2019  . Trochanteric bursitis of left hip 05/29/2019   Resolved Ambulatory Problems    Diagnosis Date Noted  . Mid back pain 10/24/2015  . Left-sided low back pain with left-sided sciatica 10/24/2015  . Neck pain 10/24/2015  . Muscle spasm of back 11/14/2015  . Chronic left-sided low back pain without sciatica 03/09/2018  . DDD (degenerative disc disease), cervical 04/16/2019   Past Medical History:  Diagnosis Date  . Diabetes mellitus without complication ([place])   . Hypertension       Review of Systems See HPI.     Objective:   Physical Exam Vitals reviewed.  Constitutional:      Appearance: Normal appearance.  HENT:     Head: Normocephalic.  Cardiovascular:     Rate and Rhythm: Normal rate and regular rhythm.  Pulses: Normal pulses.  Pulmonary:     Effort: Pulmonary effort is normal.  Musculoskeletal:     Comments: Tenderness over paraspinal muscles to palpation upper and lower spine.  Mild tenderness over left greater trochanter.  Negative straight leg test.   Neurological:     General: No focal  deficit present.     Mental Status: She is alert and oriented to person, place, and time.  Psychiatric:        Mood and Affect: Mood normal.           Assessment & Plan:  [name] KitchenMarland KitchenRandall was seen today for follow-up.  Diagnoses and all orders for this visit:  Trochanteric bursitis of left hip  Left lumbar radiculitis   Improving. Keep follow up in 2 weeks with ortho. Increased gabapentin to 1-3 tablets up to three times a day. Keep taking mobic daily. OTC medication is tylenol and benadryl and ok to use.  Ice left hip as needed.

## 2019-06-05 ENCOUNTER — Encounter: Payer: Self-pay | Admitting: Physician Assistant

## 2019-06-05 ENCOUNTER — Telehealth: Payer: Self-pay

## 2019-06-05 MED ORDER — GABAPENTIN 300 MG PO CAPS: ORAL_CAPSULE | ORAL | 2 refills | Status: DC

## 2019-06-05 NOTE — Telephone Encounter (Signed)
Patient advised and medication sent.

## 2019-06-05 NOTE — Telephone Encounter (Signed)
I was going to wait until she let me know if she was up to the 3 tablets if she was taking 300mg  I would send in 300mg  right now she has the 100mg  that I told her to take 1-3 tablets. If she needs refill can send 300mg  TID #90 with 2 refills.

## 2019-06-05 NOTE — Telephone Encounter (Signed)
[name] called and left a message stating Diane Brooks increased her gabapentin. She states she needs a new prescription. Please advise.

## 2019-06-19 ENCOUNTER — Encounter: Payer: Self-pay | Admitting: Orthopaedic Surgery

## 2019-06-19 ENCOUNTER — Other Ambulatory Visit: Payer: Self-pay

## 2019-06-19 ENCOUNTER — Ambulatory Visit (INDEPENDENT_AMBULATORY_CARE_PROVIDER_SITE_OTHER): Payer: 59 | Admitting: Orthopaedic Surgery

## 2019-06-19 VITALS — BP 124/71 | HR 73 | Ht 63.0 in | Wt 165.0 lb

## 2019-06-19 DIAGNOSIS — M47812 Spondylosis without myelopathy or radiculopathy, cervical region: Secondary | ICD-10-CM

## 2019-06-19 DIAGNOSIS — M5126 Other intervertebral disc displacement, lumbar region: Secondary | ICD-10-CM

## 2019-06-19 NOTE — Progress Notes (Signed)
Office Visit Note   Patient: Diane Brooks           Date of Birth: 05-15-1955           MRN: [id number] Visit Date: 06/19/2019              Requested by: Donella Stade, PA-C [place],  [place] [address] PCP: Donella Stade, PA-C   Assessment & Plan: Visit Diagnoses: No diagnosis found.  Plan: Patient has cervical spondylosis by plain radiograph.  MRI scan lumbar 05/21/2019 showed some degenerative facet changes small right foraminal disc protrusion without significant compression.  She was requesting possible cervical steroid injection since the injection her trochanter helped so much.  Lab work on a walking program if she develops increasing radicular symptoms in the upper extremities radiating to her hands we can consider MRI scan of the cervical spine.  Follow-Up Instructions: No follow-ups on file.   Orders:  No orders of the defined types were placed in this encounter.  No orders of the defined types were placed in this encounter.     Procedures: No procedures performed   Clinical Data: No additional findings.   Subjective: Chief Complaint  Patient presents with  . Neck - Follow-up  . Lower Back - Follow-up    HPI 64 year old female returns for follow-up of neck and back symptoms.  Trochanteric injection performed 05/29/2019 which gave her fairly good relief.  Occasionally she has had some pain in her neck with some numbness in her shoulders.  Continues to have some discomfort in left leg and at times she feels like the pain radiates to her great toe over the dorsum of the great toe and second toe.  Recently her PCP increased her gabapentin to 300 mg 3 times daily she has had no problems with sleepiness.  She feels like the gabapentin has helped.  Review of Systems 14 point systems update unchanged from 05/29/2019 other than as mentioned in HPI.   Objective: Vital Signs: BP 124/71   Pulse 73   Ht 5\' 3"  (1.6 m)   Wt 165 lb (74.8 kg)    BMI 29.23 kg/m   Physical Exam Constitutional:      Appearance: She is well-developed.  HENT:     Head: Normocephalic.     Right Ear: External ear normal.     Left Ear: External ear normal.  Eyes:     Pupils: Pupils are equal, round, and reactive to light.  Neck:     Thyroid: No thyromegaly.     Trachea: No tracheal deviation.  Cardiovascular:     Rate and Rhythm: Normal rate.  Pulmonary:     Effort: Pulmonary effort is normal.  Abdominal:     Palpations: Abdomen is soft.  Skin:    General: Skin is warm and dry.  Neurological:     Mental Status: She is alert and oriented to person, place, and time.  Psychiatric:        Behavior: Behavior normal.     Ortho Exam patient has good cervical range of motion some mild tenderness of brachial plexus more on the left than right.  No shoulder impingement.  She is able to heel and toe walk.  Left trochanter is minimally tender.  Negative logroll the hips knees reach full extension negative straight leg raising 90 degrees. Negative lower extremity clonus no hyperreflexia. Specialty Comments:  No specialty comments available.  Imaging: No results found.   PMFS History:  Patient Active Problem List   Diagnosis Date Noted  . Trochanteric bursitis of left hip 05/29/2019  . Protrusion of lumbar intervertebral disc 05/21/2019  . DDD (degenerative disc disease), lumbar 05/21/2019  . Urinary incontinence 05/18/2019  . Left lumbar radiculitis 04/27/2019  . Radiculitis of left cervical region 04/27/2019  . Facet arthritis, degenerative, lumbar spine 04/16/2019  . Spondylosis without myelopathy or radiculopathy, cervical region 04/16/2019  . Anxiety as acute reaction to exceptional stress 09/07/2018  . Blepharospasm 06/13/2018  . Hypertension associated with diabetes ([place]) 03/07/2018  . Microalbuminuria 09/13/2017  . Vitamin D deficiency 09/12/2017  . Visit for screening mammogram 09/12/2017  . Myofascial pain syndrome 07/14/2017  .  Shortness of breath 07/07/2017  . Reactive airway disease 07/03/2017  . Persistent cough for 3 weeks or longer 07/03/2017  . Itching 06/13/2017  . Anxiety 06/13/2017  . Lipoma of left lower extremity 06/13/2017  . Memory changes 04/24/2017  . Low serum vitamin B12 04/20/2017  . Systolic murmur [id number]  . Light stools 03/03/2016  . Mouth sores 12/15/2015  . Black stools 12/15/2015  . Polyarthralgia 11/19/2015  . De Quervain's disease (radial styloid tenosynovitis) 11/14/2015  . BPV (benign positional vertigo) 11/14/2015  . Migraine headache without aura 10/16/2015  . Abdominal pain, epigastric 11/09/2013  . Obesity, unspecified 10/29/2013  . Hot flashes 10/29/2013  . Seasonal allergies 08/03/2013  . Allergic rhinitis 08/03/2013  . Tinea versicolor 08/03/2013  . Sudoriferous cyst 07/17/2013  . 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 Obesity (BMI 30-39.9) 06/27/2013  . Diabetes mellitus with microalbuminuria ([place]) 06/27/2013  . Abnormal weight gain 06/27/2013  . Hyperlipemia 06/27/2013  . Cyst, eyelid sebaceous 06/27/2013  . Onychomycosis 06/27/2013  . Colon cancer screening 05/25/2013  . High risk HPV infection 05/24/2013  . GERD (gastroesophageal reflux disease) 05/10/2013  . Globus sensation 05/10/2013  . Atypical chest pain 05/10/2013  . Internal hemorrhoids 05/10/2013  . Decreased libido 05/10/2013  . Hyperlipidemia 03/28/2013  . Hiatal hernia 03/28/2013  . Essential hypertension, benign 03/28/2013  . Diabetes mellitus type II, controlled ([place]) 03/28/2013   Past Medical History:  Diagnosis Date  . Diabetes mellitus without complication ([place])   . Hyperlipidemia   . Hypertension     Family History  Problem Relation Age of Onset  . Hyperlipidemia Mother   . Stroke Mother   . Diabetes Father   . Hyperlipidemia Father   . Hypertension Father   . Diabetes Maternal Grandfather   . Hyperlipidemia Maternal Grandfather   . Diabetes Paternal Grandfather   .  Hyperlipidemia Paternal Grandfather     Past Surgical History:  Procedure Laterality Date  . BREAST ENHANCEMENT SURGERY    . ESOPHAGEAL MANOMETRY N/A 07/02/2013   Procedure: ESOPHAGEAL MANOMETRY (EM);  Surgeon: Jerene Bears, MD;  Location: [facility];  Service: Gastroenterology;  Laterality: N/A;  . TONSILECTOMY/ADENOIDECTOMY WITH MYRINGOTOMY    . TUBAL LIGATION    . tummy tuck     Social History   Occupational History  . Occupation: Retired  Tobacco Use  . Smoking status: Former Smoker    Quit date: 03/23/1991    Years since quitting: 28.2  . Smokeless tobacco: Never Used  Substance and Sexual Activity  . Alcohol use: No  . Drug use: No  . Sexual activity: Yes    Birth control/protection: Surgical, Post-menopausal

## 2019-07-17 ENCOUNTER — Telehealth: Payer: Self-pay

## 2019-07-17 DIAGNOSIS — J329 Chronic sinusitis, unspecified: Secondary | ICD-10-CM

## 2019-07-17 DIAGNOSIS — J40 Bronchitis, not specified as acute or chronic: Secondary | ICD-10-CM

## 2019-07-17 MED ORDER — LEVOCETIRIZINE DIHYDROCHLORIDE 5 MG PO TABS: 5.0000 mg | ORAL_TABLET | Freq: Every evening | ORAL | 11 refills | Status: DC

## 2019-07-17 NOTE — Telephone Encounter (Signed)
Diane Brooks states she is having seasonal allergies. She reports sneezing, itching in throat and ears. She would like a refill on Zyxal. This has been prescribed in the past by [facility]. Not on current medication list.

## 2019-07-28 ENCOUNTER — Other Ambulatory Visit: Payer: Self-pay | Admitting: Physician Assistant

## 2019-07-28 DIAGNOSIS — J329 Chronic sinusitis, unspecified: Secondary | ICD-10-CM

## 2019-07-28 DIAGNOSIS — J40 Bronchitis, not specified as acute or chronic: Secondary | ICD-10-CM

## 2019-07-31 ENCOUNTER — Encounter: Payer: Self-pay | Admitting: Nurse Practitioner

## 2019-07-31 ENCOUNTER — Other Ambulatory Visit: Payer: Self-pay

## 2019-07-31 ENCOUNTER — Ambulatory Visit (INDEPENDENT_AMBULATORY_CARE_PROVIDER_SITE_OTHER): Payer: 59 | Admitting: Nurse Practitioner

## 2019-07-31 VITALS — BP 136/61 | HR 68 | Ht 63.0 in | Wt 170.3 lb

## 2019-07-31 DIAGNOSIS — J40 Bronchitis, not specified as acute or chronic: Secondary | ICD-10-CM

## 2019-07-31 DIAGNOSIS — J3089 Other allergic rhinitis: Secondary | ICD-10-CM | POA: Diagnosis not present

## 2019-07-31 DIAGNOSIS — J329 Chronic sinusitis, unspecified: Secondary | ICD-10-CM

## 2019-07-31 DIAGNOSIS — E1142 Type 2 diabetes mellitus with diabetic polyneuropathy: Secondary | ICD-10-CM

## 2019-07-31 DIAGNOSIS — B351 Tinea unguium: Secondary | ICD-10-CM

## 2019-07-31 LAB — POCT GLYCOSYLATED HEMOGLOBIN (HGB A1C): Hemoglobin A1C: 5.9 % — AB (ref 4.0–5.6)

## 2019-07-31 LAB — HM DIABETES EYE EXAM

## 2019-07-31 MED ORDER — LEVOCETIRIZINE DIHYDROCHLORIDE 5 MG PO TABS: 5.0000 mg | ORAL_TABLET | Freq: Every evening | ORAL | 1 refills | Status: DC

## 2019-07-31 MED ORDER — DULOXETINE HCL 30 MG PO CPEP: 30.0000 mg | ORAL_CAPSULE | Freq: Every day | ORAL | 1 refills | Status: DC

## 2019-07-31 MED ORDER — CICLOPIROX 8 % EX SOLN: Freq: Every day | CUTANEOUS | 1 refills | Status: DC

## 2019-07-31 MED ORDER — MONTELUKAST SODIUM 10 MG PO TABS: 10.0000 mg | ORAL_TABLET | Freq: Every day | ORAL | 3 refills | Status: DC

## 2019-07-31 MED ORDER — GABAPENTIN 300 MG PO CAPS: 300.0000 mg | ORAL_CAPSULE | Freq: Three times a day (TID) | ORAL | 1 refills | Status: DC

## 2019-07-31 NOTE — Progress Notes (Signed)
Established Patient Office Visit  Subjective:  Patient ID: Diane Brooks, female    DOB: [birth year]  Age: 64 y.o. MRN: [id number]  CC:  Chief Complaint  Patient presents with  . Diabetes    HPI Diane Brooks presents for follow-up for diabetes and to discuss seasonal allergy symptoms.   DIABETES Taking medications as prescribed: yes Taking Insulin?: no Glucose Monitoring: yes  Accucheck frequency: Daily  Fasting glucose: 96-126 Hypoglycemic episodes:no Polydipsia/polyuria: no Visual changes: no Chest pain: no Paresthesias: yes Blood Pressure Monitoring: not checking Retinal Examination: Not up to Date Foot Exam: Up to Date  NEUROPATHY Neuropathy status: uncontrolled  Satisfied with current treatment?: She has stopped taking Cymbalta and Gapapentin at this time Medication side effects: no Medication compliance:  poor compliance Location: bilateral feet Pain: yes Severity: moderate  Quality: burning, "cold", pins and needles Frequency: constant Bilateral: yes Symmetric: yes Numbness: no Decreased sensation: yes Weakness: no Context: stable Alleviating factors: nothing Aggravating factors: crossing legs She reports stopping cymbalta and gabapentin when she had bursitis under the recommendation by the orthopedic doctor and did not restart these.   ALLERGIES She reports an increase in seasonal allergy symptoms including eye irritation, sneezing, post nasal drip, cough, and rhinorrhea. She has tried multiple over the counter medications with no relief. She did get a prescription in the past for Xyzal and felt this helped better than anything. She would like to try this again.   TOENAIL FUNGUS Patient has ongoing fungus present on the left first and second toenail. She has been using prescription antifungal treatment and feels it is helping, but requests a refill today to continue treatment.  Past Medical History:  Diagnosis Date  . Diabetes mellitus without  complication ([place])   . Hyperlipidemia   . Hypertension     Past Surgical History:  Procedure Laterality Date  . BREAST ENHANCEMENT SURGERY    . ESOPHAGEAL MANOMETRY N/A 07/02/2013   Procedure: ESOPHAGEAL MANOMETRY (EM);  Surgeon: Jerene Bears, MD;  Location: [facility];  Service: Gastroenterology;  Laterality: N/A;  . TONSILECTOMY/ADENOIDECTOMY WITH MYRINGOTOMY    . TUBAL LIGATION    . tummy tuck      Family History  Problem Relation Age of Onset  . Hyperlipidemia Mother   . Stroke Mother   . Diabetes Father   . Hyperlipidemia Father   . Hypertension Father   . Diabetes Maternal Grandfather   . Hyperlipidemia Maternal Grandfather   . Diabetes Paternal Grandfather   . Hyperlipidemia Paternal Grandfather     Social History   Socioeconomic History  . Marital status: Married    Spouse name: Not on file  . Number of children: 2  . Years of education: Not on file  . Highest education level: Not on file  Occupational History  . Occupation: Retired  Tobacco Use  . Smoking status: Former Smoker    Quit date: 03/23/1991    Years since quitting: 28.3  . Smokeless tobacco: Never Used  Substance and Sexual Activity  . Alcohol use: No  . Drug use: No  . Sexual activity: Yes    Birth control/protection: Surgical, Post-menopausal  Other Topics Concern  . Not on file  Social History Narrative  . Not on file   Social Determinants of Health   Financial Resource Strain:   . Difficulty of Paying Living Expenses:   Food Insecurity:   . Worried About Charity fundraiser in the Last Year:   . [place] in the Last Year:  Transportation Needs:   . Film/video editor (Medical):   [name] Kitchen Lack of Transportation (Non-Medical):   Physical Activity:   . Days of Exercise per Week:   . Minutes of Exercise per Session:   Stress:   . Feeling of Stress :   Social Connections:   . Frequency of Communication with Friends and Family:   . Frequency of Social Gatherings with Friends  and Family:   . Attends Religious Services:   . Active Member of Clubs or Organizations:   . Attends Archivist Meetings:   [name] Kitchen Marital Status:   Intimate Partner Violence:   . Fear of Current or Ex-Partner:   . Emotionally Abused:   [name] Kitchen Physically Abused:   . Sexually Abused:     Outpatient Medications Prior to Visit  Medication Sig Dispense Refill  . AMBULATORY NON FORMULARY MEDICATION Freestyle Glucometer   Dx: 250.00 DM, type II 1 Device 0  . AMBULATORY NON FORMULARY MEDICATION Freestyle test strips.   DX Type II diabetes 100 strip 1  . atenolol (TENORMIN) 25 MG tablet Take 1 tablet (25 mg total) by mouth daily. 90 tablet 3  . atorvastatin (LIPITOR) 20 MG tablet Take 1 tablet (20 mg total) by mouth daily. 90 tablet 3  . glucose blood (FREESTYLE LITE) test strip USE AS DIRECTED to test blood sugar once daily. DX E11.9. May sub with patient or insurance choice 100 each 1  . hydrochlorothiazide (HYDRODIURIL) 25 MG tablet Take 1 tablet (25 mg total) by mouth daily. 90 tablet 3  . losartan (COZAAR) 25 MG tablet Take 1 tablet (25 mg total) by mouth daily. 90 tablet 3  . meloxicam (MOBIC) 15 MG tablet Take 1 tablet (15 mg total) by mouth daily. 30 tablet 1  . metFORMIN (GLUCOPHAGE) 1000 MG tablet Take 1 tablet (1,000 mg total) by mouth 2 (two) times daily with a meal. 180 tablet 1  . pantoprazole (PROTONIX) 40 MG tablet Take 1 tablet (40 mg total) by mouth daily. 90 tablet 1  . saxagliptin HCl (ONGLYZA) 2.5 MG TABS tablet Take 1 tablet (2.5 mg total) by mouth daily. 90 tablet 1  . ciclopirox (PENLAC) 8 % solution Apply topically at bedtime. Apply over nail and surrounding skin. Apply daily over previous coat. After seven (7) days, may remove with alcohol and continue cycle. 6.6 mL 0  . DULoxetine (CYMBALTA) 30 MG capsule Take 30 mg by mouth daily.    [name] Kitchen gabapentin (NEURONTIN) 100 MG capsule Take one tablet for 3 days at bedtime then increase by one tablet every 3 days until taking  one tablet three times a day. (Patient not taking: Reported on 06/19/2019) 90 capsule 1  . gabapentin (NEURONTIN) 300 MG capsule Take 300 mg tablet once daily for 3 days then increase to twice daily for 3 days then increase to three times daily. 90 capsule 2  . levocetirizine (XYZAL) 5 MG tablet TAKE 1 TABLET BY MOUTH ONCE DAILY IN THE EVENING 90 tablet 0   No facility-administered medications prior to visit.    Allergies  Allergen Reactions  . Lisinopril Cough    ROS Review of Systems  Constitutional: Negative for appetite change, chills, fatigue and fever.  HENT: Positive for postnasal drip, rhinorrhea, sneezing and voice change. Negative for congestion.   Eyes: Positive for redness and itching. Negative for photophobia, pain, discharge and visual disturbance.  Respiratory: Positive for cough. Negative for chest tightness, shortness of breath and wheezing.   Cardiovascular: Negative for chest pain, palpitations  and leg swelling.  Gastrointestinal: Negative for abdominal distention, abdominal pain, constipation, diarrhea, nausea and vomiting.  Endocrine: Negative for polydipsia, polyphagia and polyuria.  Genitourinary: Negative for dysuria.  Skin: Negative for wound.  Neurological: Negative for dizziness, tremors, weakness, light-headedness and headaches.  Hematological: Negative for adenopathy.  Psychiatric/Behavioral: Negative for sleep disturbance.      Objective:    Physical Exam  Constitutional: She is oriented to person, place, and time. She appears well-developed and well-nourished.  HENT:  Head: Normocephalic.  Right Ear: External ear normal.  Left Ear: External ear normal.  Eyes: Pupils are equal, round, and reactive to light. Conjunctivae and EOM are normal.  Neck: No JVD present. No thyromegaly present.  Cardiovascular: Normal rate, regular rhythm, normal heart sounds and intact distal pulses.  Pulmonary/Chest: Effort normal and breath sounds normal. No respiratory  distress. She has no wheezes.  Abdominal: Soft. Bowel sounds are normal. She exhibits no distension.  Musculoskeletal:        General: No tenderness or edema. Normal range of motion.     Cervical back: Normal range of motion and neck supple.  Lymphadenopathy:    She has no cervical adenopathy.  Neurological: She is alert and oriented to person, place, and time. She has normal reflexes. No cranial nerve deficit. Coordination normal.  Skin: Skin is warm and dry.     Psychiatric: She has a normal mood and affect. Her behavior is normal. Judgment and thought content normal.  Nursing note and vitals reviewed.   BP 136/61   Pulse 68   Ht 5\' 3"  (1.6 m)   Wt 170 lb 5 oz (77.3 kg)   SpO2 100%   BMI 30.17 kg/m  Wt Readings from Last 3 Encounters:  07/31/19 170 lb 5 oz (77.3 kg)  06/19/19 165 lb (74.8 kg)  06/04/19 165 lb (74.8 kg)     Health Maintenance Due  Topic Date Due  . COVID-19 Vaccine (1) Never done    There are no preventive care reminders to display for this patient.  Lab Results  Component Value Date   TSH 0.99 04/19/2017   Lab Results  Component Value Date   WBC 7.7 07/07/2017   HGB 13.8 07/07/2017   HCT 40.1 07/07/2017   MCV 87.7 07/07/2017   PLT 337 07/07/2017   Lab Results  Component Value Date   NA 140 09/06/2018   K 4.0 09/06/2018   CO2 30 09/06/2018   GLUCOSE 121 (H) 09/06/2018   BUN 16 09/06/2018   CREATININE 0.85 09/06/2018   BILITOT 0.6 09/06/2018   ALKPHOS 54 05/26/2016   AST 23 09/06/2018   ALT 23 09/06/2018   PROT 7.0 09/06/2018   ALBUMIN 4.0 05/26/2016   CALCIUM 9.7 09/06/2018   Lab Results  Component Value Date   CHOL 162 09/06/2018   Lab Results  Component Value Date   HDL 66 09/06/2018   Lab Results  Component Value Date   LDLCALC 77 09/06/2018   Lab Results  Component Value Date   TRIG 106 09/06/2018   Lab Results  Component Value Date   CHOLHDL 2.5 09/06/2018   Lab Results  Component Value Date   HGBA1C 5.9 (A)  07/31/2019      Assessment & Plan:   1. Controlled type 2 diabetes mellitus with diabetic polyneuropathy, without long-term current use of insulin ([place]) Diabetes well controlled on current regimen. Instructions to continue current medications and monitoring of her blood sugar daily.  Plan to restart Cymbalta and gabapentin  for diabetic neuropathy symptoms. Patient instructed that Cymbalta is to be taken every day in order to be most effective. Gabapentin may be taken at bedtime only, if it makes her too sleepy.  Diabetic foot exam performed today.  Plan to follow-up in 3 months for recheck of diabetes.  - DULoxetine (CYMBALTA) 30 MG capsule; Take 1 capsule (30 mg total) by mouth daily.  Dispense: 90 capsule; Refill: 1 - gabapentin (NEURONTIN) 300 MG capsule; Take 1 capsule (300 mg total) by mouth 3 (three) times daily. Start with 300 mg at bedtime for 3 days. Then increase to 300 mg every 8 hours as tolerated for nerve pain.  Dispense: 270 capsule; Refill: 1  2. Sinobronchitis Seasonal allergies causing sinus symptoms of post nasal drip, rhinorrhea, and cough not well controlled with over the counter regimens. Will reorder Xyzal, which has been effective for her allergy symptoms in the past.  Patient instructed to notify the office if her symptoms worsen or signs of infection present.  - levocetirizine (XYZAL) 5 MG tablet; Take 1 tablet (5 mg total) by mouth every evening.  Dispense: 90 tablet; Refill: 1  3. Environmental and seasonal allergies Xyzal prescription provided for relief of seasonal allergy symptoms not well controlled with OTC medications.   4. Toenail fungus Toenail fungus to the left great toe and second toenail. Fungus appears to be improving with topical treatment. Plan to continue topical treatment. Refill provided.  Patient instructed to monitor her feet for redness, irritation, or wounds and to avoid clipping her toenails too short.  Follow-up if symptoms worsen or fail  to improve.  - ciclopirox (PENLAC) 8 % solution; Apply topically at bedtime. Apply over nail and surrounding skin. Apply daily over previous coat. After seven (7) days, may remove with alcohol and continue cycle.  Dispense: 6.6 mL; Refill: 1  Follow-up: Return in about 3 months (around 10/31/2019) for DM/HTN/HLD.    Orma Render, NP

## 2019-07-31 NOTE — Patient Instructions (Addendum)
Allergic Rhinitis, Adult Allergic rhinitis is an allergic reaction that affects the mucous membrane inside the nose. It causes sneezing, a runny or stuffy nose, and the feeling of mucus going down the back of the throat (postnasal drip). Allergic rhinitis can be mild to severe. There are two types of allergic rhinitis:  Seasonal. This type is also called hay fever. It happens only during certain seasons.  Perennial. This type can happen at any time of the year. What are the causes? This condition happens when the body's defense system (immune system) responds to certain harmless substances called allergens as though they were germs.  Seasonal allergic rhinitis is triggered by pollen, which can come from grasses, trees, and weeds. Perennial allergic rhinitis may be caused by:  House dust mites.  Pet dander.  Mold spores. What are the signs or symptoms? Symptoms of this condition include:  Sneezing.  Runny or stuffy nose (nasal congestion).  Postnasal drip.  Itchy nose.  Tearing of the eyes.  Trouble sleeping.  Daytime sleepiness. How is this diagnosed? This condition may be diagnosed based on:  Your medical history.  A physical exam.  Tests to check for related conditions, such as: ? Asthma. ? Pink eye. ? Ear infection. ? Upper respiratory infection.  Tests to find out which allergens trigger your symptoms. These may include skin or blood tests. How is this treated? There is no cure for this condition, but treatment can help control symptoms. Treatment may include:  Taking medicines that block allergy symptoms, such as antihistamines. Medicine may be given as a shot, nasal spray, or pill.  Avoiding the allergen.  Desensitization. This treatment involves getting ongoing shots until your body becomes less sensitive to the allergen. This treatment may be done if other treatments do not help.  If taking medicine and avoiding the allergen does not work, new,  stronger medicines may be prescribed. Follow these instructions at home:  Find out what you are allergic to. Common allergens include smoke, dust, and pollen.  Avoid the things you are allergic to. These are some things you can do to help avoid allergens: ? Replace carpet with wood, tile, or vinyl flooring. Carpet can trap dander and dust. ? Do not smoke. Do not allow smoking in your home. ? Change your heating and air conditioning filter at least once a month. ? During allergy season:  Keep windows closed as much as possible.  Plan outdoor activities when pollen counts are lowest. This is usually during the evening hours.  When coming indoors, change clothing and shower before sitting on furniture or bedding.  Take over-the-counter and prescription medicines only as told by your health care provider.  Keep all follow-up visits as told by your health care provider. This is important. Contact a health care provider if:  You have a fever.  You develop a persistent cough.  You make whistling sounds when you breathe (you wheeze).  Your symptoms interfere with your normal daily activities. Get help right away if:  You have shortness of breath. Summary  This condition can be managed by taking medicines as directed and avoiding allergens.  Contact your health care provider if you develop a persistent cough or fever.  During allergy season, keep windows closed as much as possible. This information is not intended to replace advice given to you by your health care provider. Make sure you discuss any questions you have with your health care provider. Document Revised: 02/18/2017 Document Reviewed: 04/15/2016 Elsevier Patient Education  [place].   Peripheral Neuropathy Peripheral neuropathy is a type of nerve damage. It affects nerves that carry signals between the spinal cord and the arms, legs, and the rest of the body (peripheral nerves). It does not affect nerves in  the spinal cord or brain. In peripheral neuropathy, one nerve or a group of nerves may be damaged. Peripheral neuropathy is a broad category that includes many specific nerve disorders, like diabetic neuropathy, hereditary neuropathy, and carpal tunnel syndrome. What are the causes? This condition may be caused by:  Diabetes. This is the most common cause of peripheral neuropathy.  Nerve injury.  Pressure or stress on a nerve that lasts a long time.  Lack (deficiency) of B vitamins. This can result from alcoholism, poor diet, or a restricted diet.  Infections.  Autoimmune diseases, such as rheumatoid arthritis and systemic lupus erythematosus.  Nerve diseases that are passed from parent to child (inherited).  Some medicines, such as cancer medicines (chemotherapy).  Poisonous (toxic) substances, such as lead and mercury.  Too little blood flowing to the legs.  Kidney disease.  Thyroid disease. In some cases, the cause of this condition is not known. What are the signs or symptoms? Symptoms of this condition depend on which of your nerves is damaged. Common symptoms include:  Loss of feeling (numbness) in the feet, hands, or both.  Tingling in the feet, hands, or both.  Burning pain.  Very sensitive skin.  Weakness.  Not being able to move a part of the body (paralysis).  Muscle twitching.  Clumsiness or poor coordination.  Loss of balance.  Not being able to control your bladder.  Feeling dizzy.  Sexual problems. How is this diagnosed? Diagnosing and finding the cause of peripheral neuropathy can be difficult. Your health care provider will take your medical history and do a physical exam. A neurological exam will also be done. This involves checking things that are affected by your brain, spinal cord, and nerves (nervous system). For example, your health care provider will check your reflexes, how you move, and what you can feel. You may have other tests, such  as:  Blood tests.  Electromyogram (EMG) and nerve conduction tests. These tests check nerve function and how well the nerves are controlling the muscles.  Imaging tests, such as CT scans or MRI to rule out other causes of your symptoms.  Removing a small piece of nerve to be examined in a lab (nerve biopsy). This is rare.  Removing and examining a small amount of the fluid that surrounds the brain and spinal cord (lumbar puncture). This is rare. How is this treated? Treatment for this condition may involve:  Treating the underlying cause of the neuropathy, such as diabetes, kidney disease, or vitamin deficiencies.  Stopping medicines that can cause neuropathy, such as chemotherapy.  Medicine to relieve pain. Medicines may include: ? Prescription or over-the-counter pain medicine. ? Antiseizure medicine. ? Antidepressants. ? Pain-relieving patches that are applied to painful areas of skin.  Surgery to relieve pressure on a nerve or to destroy a nerve that is causing pain.  Physical therapy to help improve movement and balance.  Devices to help you move around (assistive devices). Follow these instructions at home: Medicines  Take over-the-counter and prescription medicines only as told by your health care provider. Do not take any other medicines without first asking your health care provider.  Do not drive or use heavy machinery while taking prescription pain medicine. Lifestyle   Do not  use any products that contain nicotine or tobacco, such as cigarettes and e-cigarettes. Smoking keeps blood from reaching damaged nerves. If you need help quitting, ask your health care provider.  Avoid or limit alcohol. Too much alcohol can cause a vitamin B deficiency, and vitamin B is needed for healthy nerves.  Eat a healthy diet. This includes: ? Eating foods that are high in fiber, such as fresh fruits and vegetables, whole grains, and beans. ? Limiting foods that are high in fat and  processed sugars, such as fried or sweet foods. General instructions   If you have diabetes, work closely with your health care provider to keep your blood sugar under control.  If you have numbness in your feet: ? Check every day for signs of injury or infection. Watch for redness, warmth, and swelling. ? Wear padded socks and comfortable shoes. These help protect your feet.  Develop a good support system. Living with peripheral neuropathy can be stressful. Consider talking with a mental health specialist or joining a support group.  Use assistive devices and attend physical therapy as told by your health care provider. This may include using a walker or a cane.  Keep all follow-up visits as told by your health care provider. This is important. Contact a health care provider if:  You have new signs or symptoms of peripheral neuropathy.  You are struggling emotionally from dealing with peripheral neuropathy.  Your pain is not well-controlled. Get help right away if:  You have an injury or infection that is not healing normally.  You develop new weakness in an arm or leg.  You fall frequently. Summary  Peripheral neuropathy is when the nerves in the arms, or legs are damaged, resulting in numbness, weakness, or pain.  There are many causes of peripheral neuropathy, including diabetes, pinched nerves, vitamin deficiencies, autoimmune disease, and hereditary conditions.  Diagnosing and finding the cause of peripheral neuropathy can be difficult. Your health care provider will take your medical history, do a physical exam, and do tests, including blood tests and nerve function tests.  Treatment involves treating the underlying cause of the neuropathy and taking medicines to help control pain. Physical therapy and assistive devices may also help. This information is not intended to replace advice given to you by your health care provider. Make sure you discuss any questions you have  with your health care provider. Document Revised: 02/18/2017 Document Reviewed: 05/17/2016 Elsevier Patient Education  2020 [employer].

## 2019-09-18 ENCOUNTER — Other Ambulatory Visit: Payer: Self-pay

## 2019-09-18 ENCOUNTER — Encounter: Payer: Self-pay | Admitting: Nurse Practitioner

## 2019-09-18 ENCOUNTER — Ambulatory Visit (INDEPENDENT_AMBULATORY_CARE_PROVIDER_SITE_OTHER): Payer: 59

## 2019-09-18 ENCOUNTER — Ambulatory Visit (INDEPENDENT_AMBULATORY_CARE_PROVIDER_SITE_OTHER): Payer: 59 | Admitting: Nurse Practitioner

## 2019-09-18 VITALS — BP 141/83 | HR 65 | Temp 98.5°F | Ht 63.0 in | Wt 174.1 lb

## 2019-09-18 DIAGNOSIS — G43401 Hemiplegic migraine, not intractable, with status migrainosus: Secondary | ICD-10-CM | POA: Diagnosis not present

## 2019-09-18 DIAGNOSIS — R1010 Upper abdominal pain, unspecified: Secondary | ICD-10-CM

## 2019-09-18 DIAGNOSIS — M546 Pain in thoracic spine: Secondary | ICD-10-CM

## 2019-09-18 MED ORDER — MELOXICAM 15 MG PO TABS
15.0000 mg | ORAL_TABLET | Freq: Every day | ORAL | 3 refills | Status: DC
Start: 2019-09-18 — End: 2020-05-21

## 2019-09-18 MED ORDER — KETOROLAC TROMETHAMINE 30 MG/ML IJ SOLN
30.0000 mg | Freq: Once | INTRAMUSCULAR | Status: AC
Start: 2019-09-18 — End: 2019-09-18
  Administered 2019-09-18: 30 mg via INTRAMUSCULAR

## 2019-09-18 MED ORDER — SUMATRIPTAN SUCCINATE 6 MG/0.5ML ~~LOC~~ SOLN
6.0000 mg | Freq: Once | SUBCUTANEOUS | Status: AC
Start: 2019-09-18 — End: 2019-09-18
  Administered 2019-09-18: 6 mg via SUBCUTANEOUS

## 2019-09-18 MED ORDER — CYCLOBENZAPRINE HCL 5 MG PO TABS: 5.0000 mg | ORAL_TABLET | Freq: Three times a day (TID) | ORAL | 0 refills | Status: DC | PRN

## 2019-09-18 MED ORDER — RIZATRIPTAN BENZOATE 5 MG PO TABS: 5.0000 mg | ORAL_TABLET | ORAL | 1 refills | Status: DC | PRN

## 2019-09-18 MED ORDER — DEXAMETHASONE SODIUM PHOSPHATE 10 MG/ML IJ SOLN
10.0000 mg | Freq: Once | INTRAMUSCULAR | Status: AC
Start: 2019-09-18 — End: 2019-09-18
  Administered 2019-09-18: 10 mg via INTRAMUSCULAR

## 2019-09-18 NOTE — Progress Notes (Signed)
Acute Office Visit  Subjective:    Patient ID: Diane Brooks, female    DOB: March 17, 1956, 64 y.o.   MRN: [id number]  Chief Complaint  Patient presents with  . Headache    onset:2wks, blurred vision, posterior head, forehead, temples, sensitive to smells, intermittent nausea, feels like she has intestinal information  . Back Pain    moved something heavy from a room upstairs down stairs, is having back pain and muscle aches    Headache  Associated symptoms include back pain.  Back Pain Associated symptoms include headaches.   Patient is in today for migraine, back pain, and abdominal pain.   MIGRAINE Patient reports onset of migraine headache approximately 2 weeks ago. She reports the pain is 8/10 and started in the frontal region around her eye and has expanded to the entire left side of her head. She reports nausea, intolerance to odors, light sensitivity, and sound sensitivity. She has tried tylenol, ibuprofen, Duexis (800mg  ibuprofen with famotidine), heat, ice, and rest without success.   She denies dizziness, weakness, aura, or impaired vision.   BACK PAIN Patient reports that she helped a friend move furniture about 2 weeks ago and developed pain in her back and shoulder that has not resolved. She reports the muscles are sore and the pain goes from her upper thoracic spine to the lumbar spine with pain in the left shoulder, as well. She has been taking ibuprofen and tylenol for her headache, which have helped the muscle pain some.   She denies chest pain, shortness of breath, bruising, weakness, numbness, tingling, or paresthesias.   ABDOMINAL PAIN She reports small, frequent, hard stools with abdominal gas, bloating, and tenderness that started about a week and a half ago. She denies any changes to her diet, dehydration, or any changes other than medications for the migraine headache. She reports her bowel movements are "small balls" and she wakes up in the middle of the night  with the urge to go to the bathroom, but never feels like she is completely emptying her bowels. She states that she normally has regular bowel movements every day. She has not taken anything for her symptoms. She did report drinking a tea to see if that would help with her stool, but it did not. She reports she has had an endoscopy and colonoscopy that were both normal in the recent past.   She denies blood in her bowels, changes to her diet.   Past Medical History:  Diagnosis Date  . Diabetes mellitus without complication ([place])   . Hyperlipidemia   . Hypertension     Past Surgical History:  Procedure Laterality Date  . BREAST ENHANCEMENT SURGERY    . ESOPHAGEAL MANOMETRY N/A 07/02/2013   Procedure: ESOPHAGEAL MANOMETRY (EM);  Surgeon: Jerene Bears, MD;  Location: [facility];  Service: Gastroenterology;  Laterality: N/A;  . TONSILECTOMY/ADENOIDECTOMY WITH MYRINGOTOMY    . TUBAL LIGATION    . tummy tuck      Family History  Problem Relation Age of Onset  . Hyperlipidemia Mother   . Stroke Mother   . Diabetes Father   . Hyperlipidemia Father   . Hypertension Father   . Diabetes Maternal Grandfather   . Hyperlipidemia Maternal Grandfather   . Diabetes Paternal Grandfather   . Hyperlipidemia Paternal Grandfather     Social History   Socioeconomic History  . Marital status: Married    Spouse name: Not on file  . Number of children: 2  . Years of education:  Not on file  . Highest education level: Not on file  Occupational History  . Occupation: Retired  Tobacco Use  . Smoking status: Former Smoker    Quit date: 03/23/1991    Years since quitting: 28.5  . Smokeless tobacco: Never Used  Substance and Sexual Activity  . Alcohol use: No  . Drug use: No  . Sexual activity: Yes    Birth control/protection: Surgical, Post-menopausal  Other Topics Concern  . Not on file  Social History Narrative  . Not on file   Social Determinants of Health   Financial Resource Strain:    . Difficulty of Paying Living Expenses:   Food Insecurity:   . Worried About Charity fundraiser in the Last Year:   . Arboriculturist in the Last Year:   Transportation Needs:   . Film/video editor (Medical):   [name] Kitchen Lack of Transportation (Non-Medical):   Physical Activity:   . Days of Exercise per Week:   . Minutes of Exercise per Session:   Stress:   . Feeling of Stress :   Social Connections:   . Frequency of Communication with Friends and Family:   . Frequency of Social Gatherings with Friends and Family:   . Attends Religious Services:   . Active Member of Clubs or Organizations:   . Attends Archivist Meetings:   [name] Kitchen Marital Status:   Intimate Partner Violence:   . Fear of Current or Ex-Partner:   . Emotionally Abused:   [name] Kitchen Physically Abused:   . Sexually Abused:     Outpatient Medications Prior to Visit  Medication Sig Dispense Refill  . AMBULATORY NON FORMULARY MEDICATION Freestyle Glucometer   Dx: 250.00 DM, type II 1 Device 0  . AMBULATORY NON FORMULARY MEDICATION Freestyle test strips.   DX Type II diabetes 100 strip 1  . atenolol (TENORMIN) 25 MG tablet Take 1 tablet (25 mg total) by mouth daily. 90 tablet 3  . atorvastatin (LIPITOR) 20 MG tablet Take 1 tablet (20 mg total) by mouth daily. 90 tablet 3  . ciclopirox (PENLAC) 8 % solution Apply topically at bedtime. Apply over nail and surrounding skin. Apply daily over previous coat. After seven (7) days, may remove with alcohol and continue cycle. 6.6 mL 1  . DULoxetine (CYMBALTA) 30 MG capsule Take 1 capsule (30 mg total) by mouth daily. 90 capsule 1  . glucose blood (FREESTYLE LITE) test strip USE AS DIRECTED to test blood sugar once daily. DX E11.9. May sub with patient or insurance choice 100 each 1  . hydrochlorothiazide (HYDRODIURIL) 25 MG tablet Take 1 tablet (25 mg total) by mouth daily. 90 tablet 3  . losartan (COZAAR) 25 MG tablet Take 1 tablet (25 mg total) by mouth daily. 90 tablet 3  .  metFORMIN (GLUCOPHAGE) 1000 MG tablet Take 1 tablet (1,000 mg total) by mouth 2 (two) times daily with a meal. 180 tablet 1  . pantoprazole (PROTONIX) 40 MG tablet Take 1 tablet (40 mg total) by mouth daily. 90 tablet 1  . saxagliptin HCl (ONGLYZA) 2.5 MG TABS tablet Take 1 tablet (2.5 mg total) by mouth daily. 90 tablet 1  . gabapentin (NEURONTIN) 300 MG capsule Take 1 capsule (300 mg total) by mouth 3 (three) times daily. Start with 300 mg at bedtime for 3 days. Then increase to 300 mg every 8 hours as tolerated for nerve pain. (Patient not taking: Reported on 09/18/2019) 270 capsule 1  . levocetirizine (XYZAL) 5 MG  tablet Take 1 tablet (5 mg total) by mouth every evening. (Patient not taking: Reported on 09/18/2019) 90 tablet 1  . meloxicam (MOBIC) 15 MG tablet Take 1 tablet (15 mg total) by mouth daily. (Patient not taking: Reported on 09/18/2019) 30 tablet 1   No facility-administered medications prior to visit.    Allergies  Allergen Reactions  . Lisinopril Cough      Objective:    Physical Exam Vitals and nursing note reviewed.  Constitutional:      Appearance: She is well-developed.  HENT:     Head: Normocephalic and atraumatic.     Mouth/Throat:     Mouth: Mucous membranes are moist.     Pharynx: Oropharynx is clear.  Eyes:     Extraocular Movements: Extraocular movements intact.     Right eye: No nystagmus.     Left eye: No nystagmus.     Pupils: Pupils are equal, round, and reactive to light.  Cardiovascular:     Rate and Rhythm: Normal rate and regular rhythm.     Heart sounds: Normal heart sounds.  Pulmonary:     Effort: Pulmonary effort is normal.     Breath sounds: Normal breath sounds.  Abdominal:     General: Abdomen is protuberant. Bowel sounds are increased. There is distension.     Palpations: Abdomen is soft. There is no mass.     Tenderness: There is abdominal tenderness in the periumbilical area, left upper quadrant and left lower quadrant. There is no  guarding.     Comments: Tenderness near the left umbilical and mid way between the LUQ and LLQ. Bowel sounds increased.   Musculoskeletal:        General: Tenderness present.     Comments: Tightness and spasms noted in the left shoulder and left cervical and thoracic spine. No ecchymosis, edema, or injury noted externally. Minimal limitation to ROM.  Skin:    General: Skin is warm and dry.     Capillary Refill: Capillary refill takes less than 2 seconds.  Neurological:     Mental Status: She is alert and oriented to person, place, and time.     Cranial Nerves: No cranial nerve deficit, dysarthria or facial asymmetry.     Sensory: No sensory deficit.     Motor: No weakness.     Deep Tendon Reflexes: Reflexes normal.  Psychiatric:        Mood and Affect: Mood normal.        Behavior: Behavior normal.     BP (!) 141/83   Pulse 65   Temp 98.5 F (36.9 C) (Oral)   Ht 5\' 3"  (1.6 m)   Wt 174 lb 1.6 oz (79 kg)   SpO2 98%   BMI 30.84 kg/m  Wt Readings from Last 3 Encounters:  09/18/19 174 lb 1.6 oz (79 kg)  07/31/19 170 lb 5 oz (77.3 kg)  06/19/19 165 lb (74.8 kg)    There are no preventive care reminders to display for this patient.  There are no preventive care reminders to display for this patient.   Lab Results  Component Value Date   TSH 0.99 04/19/2017   Lab Results  Component Value Date   WBC 7.7 07/07/2017   HGB 13.8 07/07/2017   HCT 40.1 07/07/2017   MCV 87.7 07/07/2017   PLT 337 07/07/2017   Lab Results  Component Value Date   NA 140 09/06/2018   K 4.0 09/06/2018   CO2 30 09/06/2018   GLUCOSE 121 (  H) 09/06/2018   BUN 16 09/06/2018   CREATININE 0.85 09/06/2018   BILITOT 0.6 09/06/2018   ALKPHOS 54 05/26/2016   AST 23 09/06/2018   ALT 23 09/06/2018   PROT 7.0 09/06/2018   ALBUMIN 4.0 05/26/2016   CALCIUM 9.7 09/06/2018   Lab Results  Component Value Date   CHOL 162 09/06/2018   Lab Results  Component Value Date   HDL 66 09/06/2018   Lab  Results  Component Value Date   LDLCALC 77 09/06/2018   Lab Results  Component Value Date   TRIG 106 09/06/2018   Lab Results  Component Value Date   CHOLHDL 2.5 09/06/2018   Lab Results  Component Value Date   HGBA1C 5.9 (A) 07/31/2019       Assessment & Plan:   1. Hemiplegic migraine with status migrainosus, not intractable Symptoms and presentation consistent with left hemiplegic migraine with status migrainosus ongoing for the past 2 weeks. We will attempt abortive treatment in the office today with a migraine cocktail of ketorolac 30mg , dexamethasone 10mg , in addition to sumatriptan 6mg  subcutaneous.  Prescription for rizatriptan provided for migraine headache abortive treatment for future use. Explained to the patient not to take this medication today.   PLAN: - Migraine cocktail today - Follow-up if symptoms worsen or fail to improve.   - ketorolac (TORADOL) 30 MG/ML injection 30 mg - SUMAtriptan (IMITREX) injection 6 mg - dexamethasone (DECADRON) injection 10 mg - rizatriptan (MAXALT) 5 MG tablet; Take 1 tablet (5 mg total) by mouth as needed for migraine. May repeat dose in 2 hours if needed  Dispense: 6 tablet; Refill: 1  2. Pain of upper abdomen Abdominal pain with tenderness, bloating, and increased gas located midway between the LUQ and LLQ and near the umbilicus. No emesis, diarrhea, or signs of rectal bleeding. No history of diverticulosis or diverticulitis. Most likely etiology is constipation, but will obtain an abdominal xray to rule out concerning findings.   PLAN: - Treatment based on x-ray results.  - Follow-up if symptoms worsen or fail to improve  - DG Abd 2 Views  3. Acute midline thoracic back pain Acute back pain and muscle spasms related to recently moving heavy furniture. Will treat conservatively with PRN flexeril and daily meloxicam. Discussed gentle stretching exercises and the use of heat and ice to help with symptoms. I am hopeful that with  the relief of her migraine headache, she will be able to move around more and work the soreness out of her muscles. Discussed the importance of not driving while taking flexeril and to avoid additional NSAIDs while taking meloxicam.  PLAN: - PRN flexeril and meloxicam for pain and muscle spasms - Use gentle stretches, ice, and heat - If symptoms persist or worsen despite treatment, let us know and further evaluation may be needed with sports medicine.   - cyclobenzaprine (FLEXERIL) 5 MG tablet; Take 1 tablet (5 mg total) by mouth 3 (three) times daily as needed for muscle spasms. And back pain  Dispense: 30 tablet; Refill: 0    Orma Render, NP

## 2019-09-18 NOTE — Patient Instructions (Addendum)
You can take 1 meloxicam by mouth every day for back pain. This medicine is like ibuprofen (advil and motrin). Do not take any additional ibuprofen medication while taking this.   You can take 1 flexeril up to three times a day for back pain and muscle spasms. This medication may make you sleepy so do not drive while taking this medicine. If it makes you too sleepy, you can cut the pill in half and take a half of a dose.   You can take the rizatriptan 1 tablet by mouth for migraine headache as soon as it starts. If the headache does not go away, you can take 1 more pill 2 hours later. Do not take more than 2 pills in a day.   We will get an xray of your abdomen today to see if you are having any complications that we cannot see. I will let you know what the xray says and we will make a plan based on the results.   You received a medication for migraine headache today, called sumatriptan, along with a medication for inflammation and pain called Toradol, and another medication for inflammation called dexamethasone. These three medications should stop the migraine headache. When you go home try to lay in a dark room and allow yourself to rest. It may take an hour or so before you feel completely better.   If you do not feel better by tomorrow, please let us know.    Cefalea migraosa Migraine Headache Una cefalea migraosa es un dolor muy intenso y punzante en uno o ambos lados de la cabeza. Este tipo de dolor de cabeza tambin puede causar otros sntomas. Puede durar desde 4horas hasta 3das. Hable con su mdico sobre las cosas que pueden causar (desencadenar) esta afeccin. Cules son las causas? Se desconoce la causa exacta de esta afeccin. Esta afeccin puede desencadenarse o ser causada por lo siguiente:  Consumo de alcohol.  Consumo de cigarrillos.  Tomar medicamentos como por ejemplo: ? Medicamentos para Best boy torcico (nitroglicerina). ? Anticonceptivos  orales. ? Estrgeno. ? Algunos medicamentos para la presin arterial.  Comer o beber ciertos productos.  Hacer actividad fsica. Otros factores que pueden provocar cefalea migraosa son los siguientes:  Tener el perodo menstrual.  Glennis Brink.  Hambre.  Estrs.  No dormir lo suficiente o dormir demasiado.  Cambios climticos.  Cansancio (fatiga). Qu incrementa el riesgo?  Tener entre 25 y 41aos de edad.  Ser mujer.  Tener antecedentes familiares de cefalea migraosa.  Ser de Science writer.  Tener depresin o ansiedad.  Tener mucho sobrepeso. Cules son los signos o los sntomas?  Un dolor punzante. Este dolor puede [employer] siguientes caractersticas: ? Audiological scientist en cualquier regin de la cabeza, tanto de un lado como de [place]. ? Puede dificultar las actividades cotidianas. ? Puede empeorar con la actividad fsica. ? Puede empeorar con las luces brillantes o los ruidos fuertes.  Otros sntomas pueden incluir: ? Ganas de vomitar (nuseas). ? Vmitos. ? Mareos. ? Sensibilidad a las luces brillantes, los ruidos fuertes o [employer].  Antes de tener una cefalea migraosa, puede recibir seales de advertencia (aura). Un aura puede incluir: ? Ver luces intermitentes o tener puntos ciegos. ? Ver puntos brillantes, halos o lneas en zigzag. ? Tener una visin en tnel o visin borrosa. ? Sentir entumecimiento u hormigueo. ? Tener dificultad para hablar. ? Tener msculos dbiles.  Algunas personas tienen sntomas despus de una cefalea migraosa (fase posdromal), como los siguientes: ? Cansancio. ?  Dificultad para pensar (concentrarse). Cmo se trata?  Tomar medicamentos para: ? Best boy. ? Aliviar la sensacin de Higher education careers adviser. ? Prevenir las Psychologist, occupational.  El tratamiento tambin puede incluir lo siguiente: ? Tomar sesiones de acupuntura. ? Evitar los alimentos que provocan las cefaleas migraosas. ? Aprender Orland Jarred de  controlar las funciones corporales (biorretroalimentacin). ? [place] para ayudarlo a Civil engineer, contracting y lidiar con los pensamientos negativos (terapia cognitivo conductual). Siga estas instrucciones en su casa: Medicamentos  [employer] de venta libre y los recetados solamente como se lo haya indicado el mdico.  Consulte a su mdico si el medicamento que le recetaron: ? Hace que sea necesario que evite conducir o usar maquinaria pesada. ? Puede causarle dificultad para defecar (estreimiento). Es posible que deba tomar estas medidas para prevenir o tratar los problemas para defecar:  Electronics engineer suficiente lquido para Contractor pis (la orina) de color amarillo plido.  Tomar medicamentos recetados o de [employer].  Comer alimentos ricos en fibra. Entre ellos, frijoles, cereales integrales y frutas y verduras frescas.  Limitar los alimentos con alto contenido de grasa y Location manager. Estos incluyen alimentos fritos o dulces. Estilo de vida  No beba alcohol.  No consuma ningn producto que contenga nicotina o tabaco, como cigarrillos, cigarrillos electrnicos y tabaco de Higher education careers adviser. Si necesita ayuda para dejar de fumar, consulte al mdico.  Duerma como mnimo 8horas todas las noches.  Limite el estrs y [place]. Indicaciones generales      Lleve un registro diario para Neurosurgeon lo que Financial trader. Registre, por ejemplo, lo siguiente: ? Lo que usted come y bebe. ? El tiempo que duerme. ? Algn cambio en lo que come o bebe. ? Algn cambio en sus medicamentos.  Si tiene una cefalea migraosa: ? Evite los factores que [employer] sntomas, como las luces brillantes. ? Resulta til acostarse en una habitacin oscura y silenciosa. ? No conduzca vehculos ni opere maquinaria pesada. ? Pregntele al mdico qu actividades son seguras para usted.  Concurra a todas las visitas de seguimiento como se lo haya indicado el mdico. Esto es importante. Comunquese  con un mdico si:  Tiene una cefalea migraosa que es diferente o peor que otras que ha tenido.  Tiene ms de [address] de cefalea por mes. Solicite ayuda inmediatamente si:  La cefalea migraosa [employer].  La cefalea migraosa dura ms de 72 horas.  Tiene fiebre.  Presenta rigidez en el cuello.  Tiene dificultad para ver.  Siente debilidad en los msculos o que no puede controlarlos.  Comienza a perder el equilibrio continuamente.  Comienza a tener dificultad para caminar.  Pierde el conocimiento (se desmaya).  Tiene una convulsin. Resumen  [place] cefalea migraosa es un dolor muy intenso y punzante en uno o ambos lados de la cabeza. Estos dolores de [place] tambin pueden causar otros sntomas.  Esta afeccin puede tratarse con medicamentos y cambios en el estilo de vida.  Lleve un registro diario para Neurosurgeon lo que Financial trader.  Comunquese con un mdico si tiene una cefalea migraosa que es diferente o peor que otras que ha tenido.  Comunquese con el mdico si tiene ms de 15 das de cefalea en un mes. Esta informacin no tiene Marine scientist el consejo del mdico. Asegrese de hacerle al mdico cualquier pregunta que tenga. Document Revised: 05/19/2018 Document Reviewed: 05/19/2018 Elsevier Patient Education  2020 [place] en los adultos Constipation, Adult Se llama estreimiento  cuando:  Tiene deposiciones (defeca) una menor cantidad de veces a la semana de lo normal.  Tiene dificultad para defecar.  Las heces son secas y duras o son ms grandes que lo normal. Siga estas indicaciones en su casa: Comida y bebida   Consuma alimentos con alto contenido de [place], por ejemplo: ? Lambert Mody y verduras frescas. ? Cereales integrales. ? Frijoles.  Consuma una menor cantidad de alimentos ricos en grasas, con bajo contenido de [place] o excesivamente procesados, como: ? Papas  fritas. ? [place]uesas. ? Galletas. ? Caramelos. ? Gaseosas.  Beba suficiente lquido para mantener el pis (orina) claro o de color amarillo plido. Instrucciones generales  Haga actividad fsica con regularidad o segn las indicaciones del mdico.  Vaya al bao cuando sienta la necesidad de defecar. No se aguante las ganas.  Tome los medicamentos de venta libre y los recetados solamente como se lo haya indicado el mdico. Estos incluyen los suplementos de [place].  Realice ejercicios de reentrenamiento del suelo plvico, como: ? Respirar profundamente mientras relaja la parte inferior del vientre (abdomen). ? Relajar el suelo plvico mientras defeca.  Controle su afeccin para ver si hay cambios.  Concurra a todas las visitas de control como se lo haya indicado el mdico. Esto es importante. Comunquese con un mdico si:  Siente un dolor que empeora.  Tiene fiebre.  No ha defecado por 4das.  Vomita.  No tiene hambre.  Pierde peso.  Tiene una hemorragia en el ano.  Las deposiciones Media planner) son delgadas como un lpiz. Solicite ayuda de inmediato si:  Jaclynn Guarneri, y los sntomas empeoran de repente.  Tiene prdida de materia fecal u observa [employer].  Siente el vientre ms duro o ms grande de lo normal (est hinchado).  Siente un dolor muy intenso en el vientre.  Se siente mareado o se desmaya. Esta informacin no tiene Marine scientist el consejo del mdico. Asegrese de hacerle al mdico cualquier pregunta que tenga. Document Revised: 06/09/2016 Document Reviewed: 08/27/2015 Elsevier Patient Education  [place].

## 2019-09-19 NOTE — [place] Notes (Signed)
Abdominal x-ray does not show any concerns for blockage or other problems with the intestines, with the exception of a large amount of stool and gas. This indicates that your symptoms are coming from constipation.   I recommend that you use two medications for the next 2-3 days to help empty your bowels- these medications are: Senna and Docusate Sodium.  Senna will help move the stool through your intestines and the docusate sodium will help soften the stools and make them easier to pass.   I also recommend you increase your water consumption to avoid dehydration.   If you are unable to pass the stool in 2-3 days with the medication, please let me know.

## 2019-09-28 ENCOUNTER — Other Ambulatory Visit: Payer: Self-pay | Admitting: Physician Assistant

## 2019-09-28 DIAGNOSIS — K219 Gastro-esophageal reflux disease without esophagitis: Secondary | ICD-10-CM

## 2019-09-28 DIAGNOSIS — K224 Dyskinesia of esophagus: Secondary | ICD-10-CM

## 2019-10-20 IMAGING — DX DG THORACIC SPINE 3V
3 series · 3 of 3 positions shown · non-contrast
Comparison: Chest x-ray 10/22/2015 .

CLINICAL DATA: Pain thoracic spine.

EXAM:
THORACIC SPINE - 3 VIEWS

[t-spine ap]
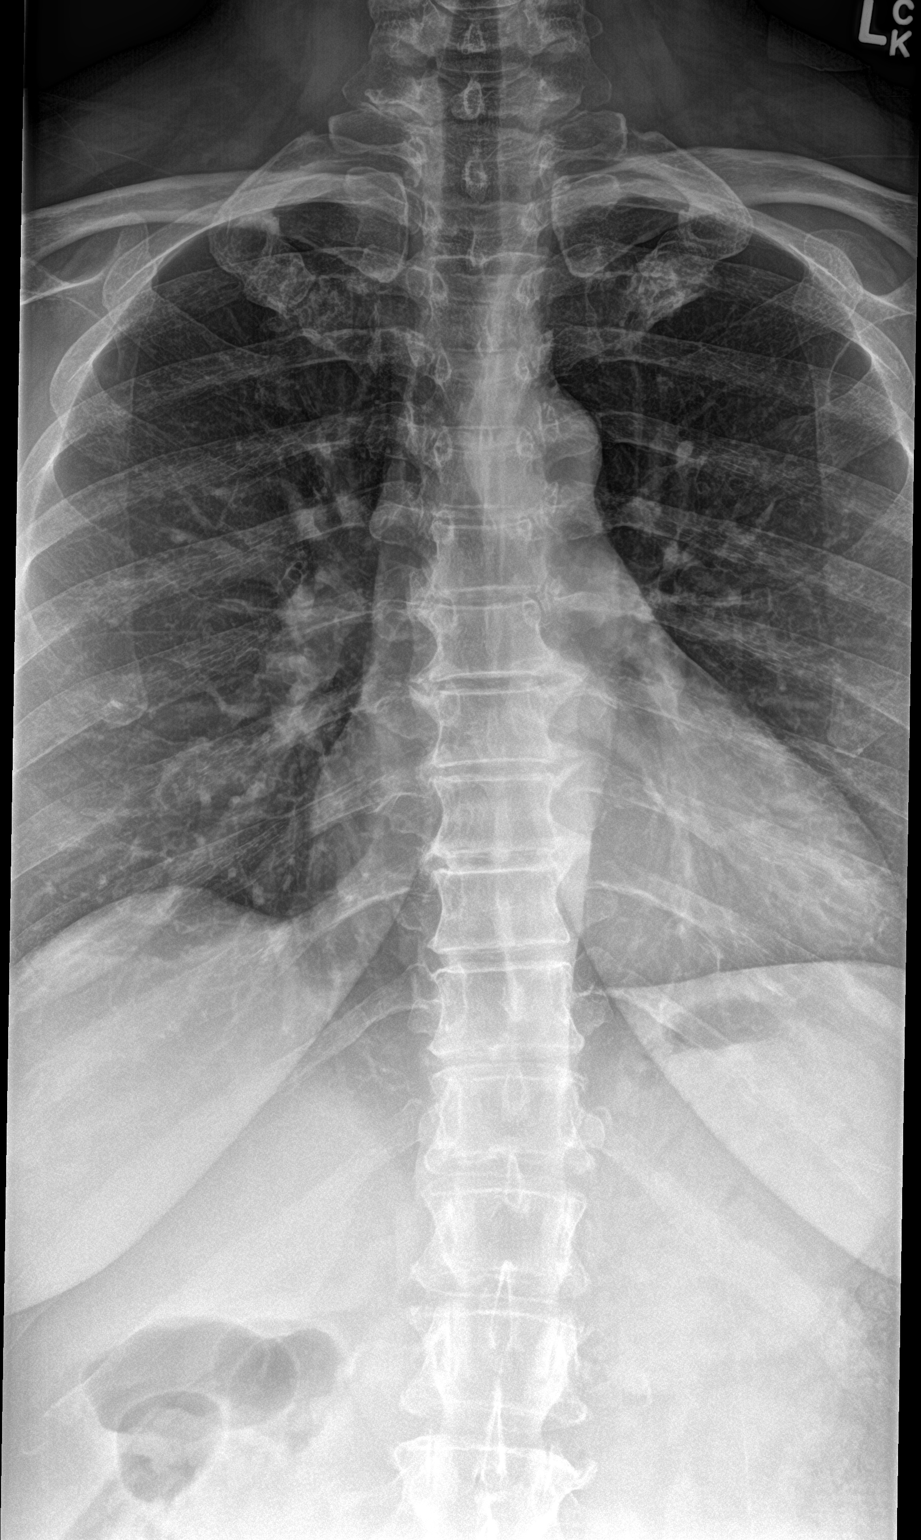

[t-spine lat]
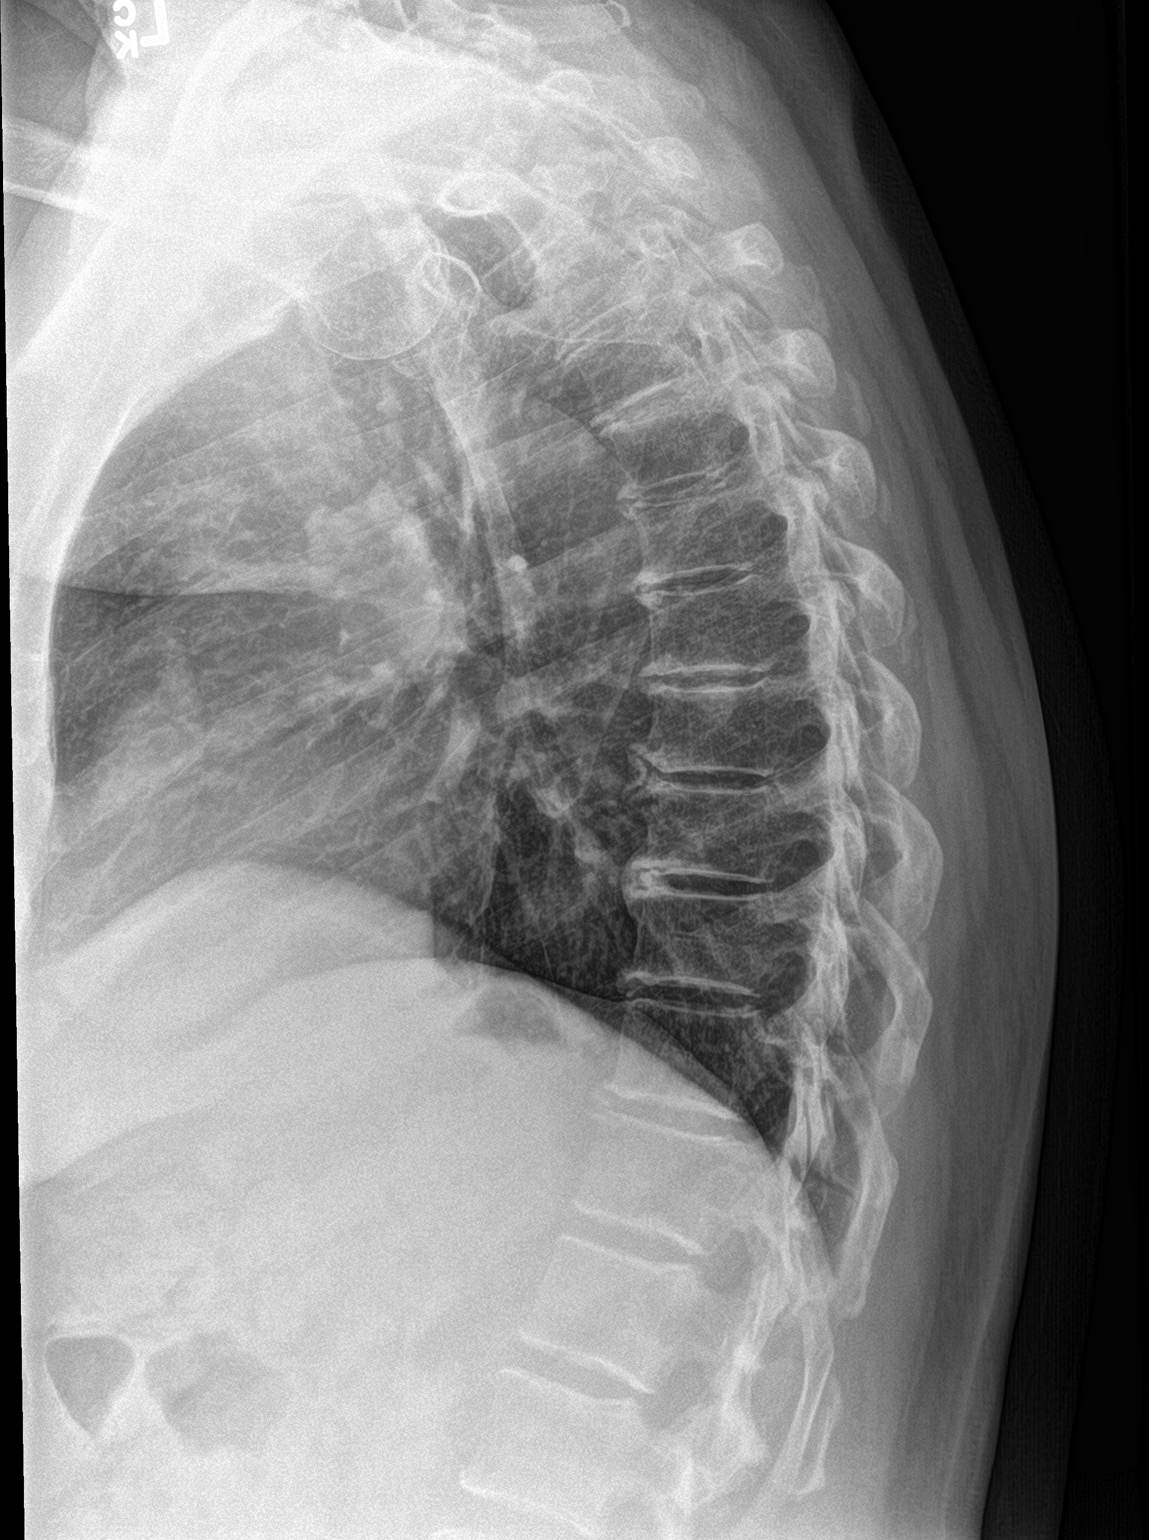

[t-spine swimmers]
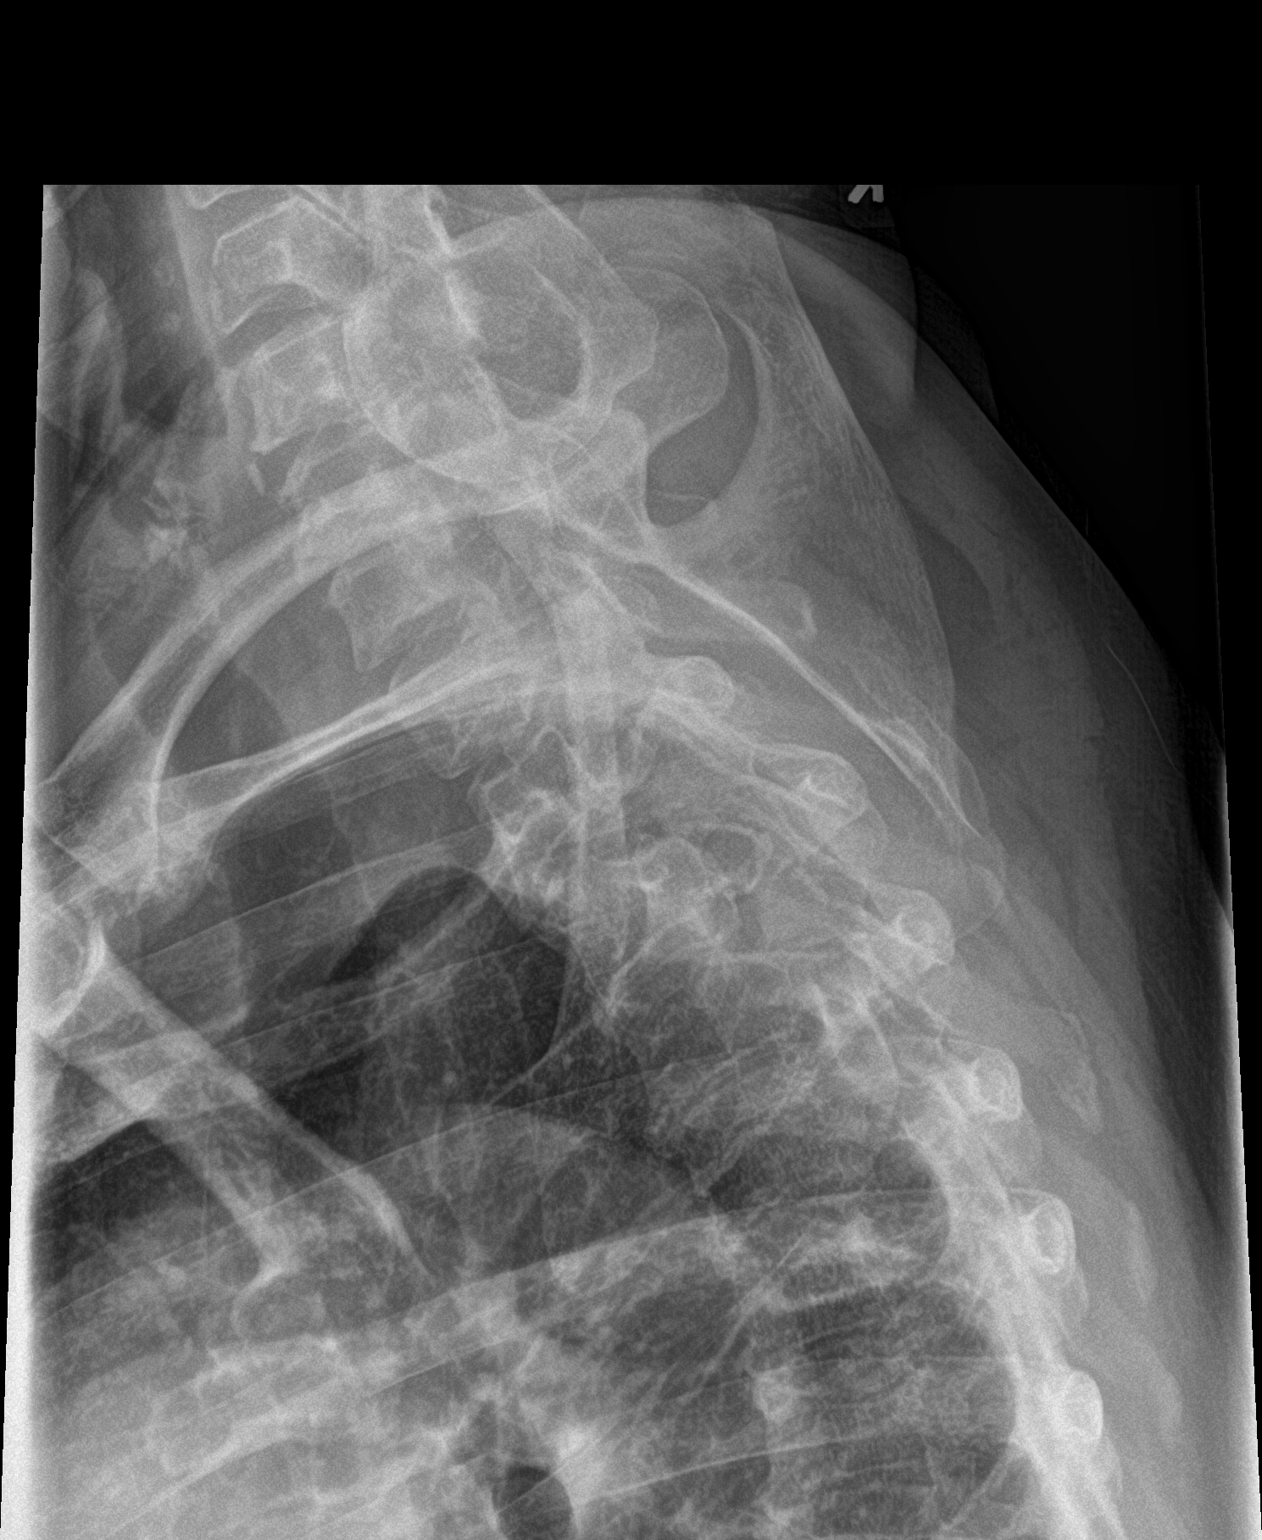

[3 of 3 positions shown; findings below may reference images not displayed]

FINDINGS: Degenerative changes thoracic spine. Mild scoliosis. No acute bony
abnormality identified. No evidence of fracture. Prior cervical
spine fusion.
IMPRESSION: Degenerative changes thoracic spine. Mild scoliosis. No acute
abnormality.

## 2019-10-26 ENCOUNTER — Encounter: Payer: Self-pay | Admitting: Physician Assistant

## 2019-10-26 ENCOUNTER — Telehealth: Payer: Self-pay | Admitting: Physician Assistant

## 2019-10-26 ENCOUNTER — Ambulatory Visit (INDEPENDENT_AMBULATORY_CARE_PROVIDER_SITE_OTHER): Payer: 59 | Admitting: Physician Assistant

## 2019-10-26 ENCOUNTER — Other Ambulatory Visit: Payer: Self-pay

## 2019-10-26 VITALS — BP 135/68 | HR 73 | Ht 63.0 in | Wt 176.0 lb

## 2019-10-26 DIAGNOSIS — I10 Essential (primary) hypertension: Secondary | ICD-10-CM

## 2019-10-26 DIAGNOSIS — R519 Headache, unspecified: Secondary | ICD-10-CM | POA: Diagnosis not present

## 2019-10-26 DIAGNOSIS — E1142 Type 2 diabetes mellitus with diabetic polyneuropathy: Secondary | ICD-10-CM

## 2019-10-26 DIAGNOSIS — G43011 Migraine without aura, intractable, with status migrainosus: Secondary | ICD-10-CM

## 2019-10-26 DIAGNOSIS — F419 Anxiety disorder, unspecified: Secondary | ICD-10-CM

## 2019-10-26 DIAGNOSIS — J302 Other seasonal allergic rhinitis: Secondary | ICD-10-CM

## 2019-10-26 LAB — POCT GLYCOSYLATED HEMOGLOBIN (HGB A1C): Hemoglobin A1C: 5.9 % — AB (ref 4.0–5.6)

## 2019-10-26 MED ORDER — METFORMIN HCL 1000 MG PO TABS: 1000.0000 mg | ORAL_TABLET | Freq: Two times a day (BID) | ORAL | 1 refills | Status: DC

## 2019-10-26 MED ORDER — BUSPIRONE HCL 5 MG PO TABS: 5.0000 mg | ORAL_TABLET | Freq: Three times a day (TID) | ORAL | 2 refills | Status: DC

## 2019-10-26 MED ORDER — TOPIRAMATE 50 MG PO TABS: ORAL_TABLET | ORAL | 2 refills | Status: DC

## 2019-10-26 MED ORDER — ONGLYZA 2.5 MG PO TABS: 2.5000 mg | ORAL_TABLET | Freq: Every day | ORAL | 1 refills | Status: DC

## 2019-10-26 NOTE — Telephone Encounter (Signed)
We have that medication on file. That is a daily medication not as needed. It can worsen anxiety if taking as needed and not daily. Start taking that daily and then buspar that was given today twice a day.

## 2019-10-26 NOTE — [facility] Notes (Signed)
Subjective:    Patient ID: Diane Brooks, female    DOB: 1956/01/26, 64 y.o.   MRN: [id number]  HPI  Pt is a 64 yo female with T2DM, HTN, Migraines, GERD, anxiety who presents to the clinic for medication refill.   She is doing well with sugars. Most fasting sugars are 80-115 in morning. No hypoglycemia. No open sores or wounds. Pt is doing well with glucose control.   She has had sinus pressure/headache since July 7th. She was treated with antibiotic but did not get better. Feels like pressure behind eyes, nose, face. Hx of migraines. Does not feel like migraine headache. No runny nose, ear pain, ST.   Pt is not taking her cymbalta daily but as needed. She continues to have anxiety and worry daily. She is very anxious about covid and what to do and not to do. She is doing yoga which helps some.   .. Active Ambulatory Problems    Diagnosis Date Noted   Hyperlipidemia 03/28/2013   Hiatal hernia 03/28/2013   Essential hypertension, benign 03/28/2013   Diabetes mellitus type II, controlled ([place]) 03/28/2013   GERD (gastroesophageal reflux disease) 05/10/2013   Globus sensation 05/10/2013   Atypical chest pain 05/10/2013   Internal hemorrhoids 05/10/2013   Decreased libido 05/10/2013   High risk HPV infection 05/24/2013   Colon cancer screening 05/25/2013   Obesity (BMI 30-39.9) 06/27/2013   Diabetes mellitus with microalbuminuria ([place]) 06/27/2013   Abnormal weight gain 06/27/2013   Hyperlipemia 06/27/2013   Cyst, eyelid sebaceous 06/27/2013   Onychomycosis 06/27/2013   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   Sudoriferous cyst 07/17/2013   Seasonal allergies 08/03/2013   Allergic rhinitis 08/03/2013   Tinea versicolor 08/03/2013   Obesity, unspecified 10/29/2013   Hot flashes 10/29/2013   Abdominal pain, epigastric 11/09/2013   Migraine headache without aura 10/16/2015   De Quervain's disease (radial styloid tenosynovitis) 11/14/2015   BPV (benign  positional vertigo) 11/14/2015   Polyarthralgia 11/19/2015   Mouth sores 12/15/2015   Black stools 12/15/2015   Light stools 95/11/3265   Systolic murmur 12/45/8099   Low serum vitamin B12 04/20/2017   Memory changes 04/24/2017   Itching 06/13/2017   Anxiety 06/13/2017   Lipoma of left lower extremity 06/13/2017   Reactive airway disease 07/03/2017   Persistent cough for 3 weeks or longer 07/03/2017   Shortness of breath 07/07/2017   Myofascial pain syndrome 07/14/2017   Vitamin D deficiency 09/12/2017   Visit for screening mammogram 09/12/2017   Microalbuminuria 09/13/2017   Hypertension associated with diabetes ([place]) 03/07/2018   Blepharospasm 06/13/2018   Anxiety as acute reaction to exceptional stress 09/07/2018   Facet arthritis, degenerative, lumbar spine 04/16/2019   Spondylosis without myelopathy or radiculopathy, cervical region 04/16/2019   Left lumbar radiculitis 04/27/2019   Radiculitis of left cervical region 04/27/2019   Urinary incontinence 05/18/2019   Protrusion of lumbar intervertebral disc 05/21/2019   DDD (degenerative disc disease), lumbar 05/21/2019   Trochanteric bursitis of left hip 05/29/2019   Resolved Ambulatory Problems    Diagnosis Date Noted   Mid back pain 10/24/2015   Left-sided low back pain with left-sided sciatica 10/24/2015   Neck pain 10/24/2015   Muscle spasm of back 11/14/2015   Chronic left-sided low back pain without sciatica 03/09/2018   DDD (degenerative disc disease), cervical 04/16/2019   Past Medical History:  Diagnosis Date   Diabetes mellitus without complication ([place])    Hypertension      Review of Systems  All other systems  reviewed and are negative.      Objective:   Physical Exam Vitals reviewed.  Constitutional:      Appearance: Normal appearance.  HENT:     Head: Normocephalic.     Right Ear: Tympanic membrane, ear canal and external ear normal. There is no impacted  cerumen.     Left Ear: Tympanic membrane, ear canal and external ear normal. There is no impacted cerumen.     Nose: Nose normal.     Mouth/Throat:     Mouth: Mucous membranes are moist.  Eyes:     Conjunctiva/sclera: Conjunctivae normal.     Pupils: Pupils are equal, round, and reactive to light.  Cardiovascular:     Rate and Rhythm: Normal rate and regular rhythm.     Pulses: Normal pulses.  Pulmonary:     Effort: Pulmonary effort is normal.     Breath sounds: Normal breath sounds.  Neurological:     General: No focal deficit present.     Mental Status: She is alert and oriented to person, place, and time.  Psychiatric:        Mood and Affect: Mood normal.    .. Depression screen [facility] 2/9 10/26/2019 09/06/2018 07/14/2017 04/19/2017 03/30/2013  Decreased Interest 1 0 3 0 0  Down, Depressed, Hopeless 3 2 1 2  0  PHQ - 2 Score 4 2 4 2  0  Altered sleeping 2 1 3  0 -  Tired, decreased energy 2 0 3 1 -  Change in appetite 1 1 0 3 -  Feeling bad or failure about yourself  3 1 0 2 -  Trouble concentrating 2 0 0 2 -  Moving slowly or fidgety/restless 2 1 3 2  -  Suicidal thoughts 0 0 0 0 -  PHQ-9 Score 16 6 13 12  -  Difficult doing work/chores - Not difficult at all Very difficult - -   .[name] Kitchen GAD 7 : Generalized Anxiety Score 10/26/2019 09/06/2018 07/14/2017  Nervous, Anxious, on Edge 3 2 0  Control/stop worrying 3 1 3   Worry too much - different things 3 1 0  Trouble relaxing 3 2 3   Restless 1 0 0  Easily annoyed or irritable 2 1 3   Afraid - awful might happen 3 1 1   Total GAD 7 Score 18 8 10   Anxiety Difficulty - Not difficult at all Very difficult           Assessment & Plan:  [name] KitchenMarland KitchenSheril was seen today for diabetes.  Diagnoses and all orders for this visit:  Controlled type 2 diabetes mellitus with diabetic polyneuropathy, without long-term current use of insulin (HCC) -     POCT glycosylated hemoglobin (Hb A1C) -     saxagliptin HCl (ONGLYZA) 2.5 MG TABS tablet; Take 1 tablet  (2.5 mg total) by mouth daily. -     metFORMIN (GLUCOPHAGE) 1000 MG tablet; Take 1 tablet (1,000 mg total) by mouth 2 (two) times daily with a meal.  Sinus headache -     topiramate (TOPAMAX) 50 MG tablet; Take one tablet at bedtime for headache prevention.  Essential hypertension, benign  Intractable migraine without aura and with status migrainosus -     topiramate (TOPAMAX) 50 MG tablet; Take one tablet at bedtime for headache prevention.  Seasonal allergies  Anxiety -     busPIRone (BUSPAR) 5 MG tablet; Take 1 tablet (5 mg total) by mouth 3 (three) times daily.   .. Results for orders placed or performed in visit on 10/26/19  POCT glycosylated hemoglobin (Hb A1C)  Result Value Ref Range   Hemoglobin A1C 5.9 (A) 4.0 - 5.6 %   HbA1c POC (<> result, manual entry)     HbA1c, POC (prediabetic range)     HbA1c, POC (controlled diabetic range)     A1C great.  Stay on same medications.  On ARB. BP very close to goal.  On STATIN.  UTD eye, foot, vaccines.  Follow up in 3 months.   Discussed to take cymbalta daily not as needed. This will help your pain and mood to take daily. It could be making your anxiety worse by taking as needed.  As buspar to be taking twice a day but can be more as needed. Continue yoga.   HA could be migraine variant vs allergic. Start topamax for prevention. Discussed side effects. Stay on anti-histamine and singulair. Use flonase. Follow up in 1 month.

## 2019-10-26 NOTE — Telephone Encounter (Signed)
Patient came back up after appointment and showed me a picture that her husband sent with this medication name, stated the PCP needed to know the name of it. AM    DULoxetine 30 MG capsule

## 2019-10-26 NOTE — Telephone Encounter (Signed)
Patient made aware and states she will start daily. Verified she should have refills at the pharmacy.

## 2019-10-26 NOTE — Patient Instructions (Addendum)
Start topamax for headache prevention.  flonase nasal spray daily.  buspar twice a day for anxiety.

## 2019-10-31 ENCOUNTER — Encounter: Payer: Self-pay | Admitting: Physician Assistant

## 2019-11-02 ENCOUNTER — Ambulatory Visit: Payer: 59 | Admitting: Physician Assistant

## 2019-12-05 ENCOUNTER — Other Ambulatory Visit: Payer: Self-pay

## 2019-12-05 ENCOUNTER — Ambulatory Visit (INDEPENDENT_AMBULATORY_CARE_PROVIDER_SITE_OTHER): Payer: 59 | Admitting: Physician Assistant

## 2019-12-05 VITALS — BP 140/71 | HR 72 | Ht 63.0 in | Wt 178.0 lb

## 2019-12-05 DIAGNOSIS — Z23 Encounter for immunization: Secondary | ICD-10-CM

## 2019-12-05 DIAGNOSIS — M7918 Myalgia, other site: Secondary | ICD-10-CM | POA: Diagnosis not present

## 2019-12-05 DIAGNOSIS — Z1231 Encounter for screening mammogram for malignant neoplasm of breast: Secondary | ICD-10-CM | POA: Diagnosis not present

## 2019-12-05 DIAGNOSIS — E1169 Type 2 diabetes mellitus with other specified complication: Secondary | ICD-10-CM

## 2019-12-05 DIAGNOSIS — F419 Anxiety disorder, unspecified: Secondary | ICD-10-CM | POA: Diagnosis not present

## 2019-12-05 DIAGNOSIS — M546 Pain in thoracic spine: Secondary | ICD-10-CM

## 2019-12-05 DIAGNOSIS — E785 Hyperlipidemia, unspecified: Secondary | ICD-10-CM

## 2019-12-05 DIAGNOSIS — E1142 Type 2 diabetes mellitus with diabetic polyneuropathy: Secondary | ICD-10-CM

## 2019-12-05 DIAGNOSIS — R1013 Epigastric pain: Secondary | ICD-10-CM

## 2019-12-05 DIAGNOSIS — K219 Gastro-esophageal reflux disease without esophagitis: Secondary | ICD-10-CM

## 2019-12-05 DIAGNOSIS — R413 Other amnesia: Secondary | ICD-10-CM

## 2019-12-05 DIAGNOSIS — F430 Acute stress reaction: Secondary | ICD-10-CM

## 2019-12-05 MED ORDER — BUSPIRONE HCL 5 MG PO TABS: 5.0000 mg | ORAL_TABLET | Freq: Three times a day (TID) | ORAL | 0 refills | Status: DC

## 2019-12-05 MED ORDER — GABAPENTIN 300 MG PO CAPS: ORAL_CAPSULE | ORAL | 0 refills | Status: DC

## 2019-12-05 MED ORDER — CYCLOBENZAPRINE HCL 5 MG PO TABS: 5.0000 mg | ORAL_TABLET | Freq: Three times a day (TID) | ORAL | 0 refills | Status: DC | PRN

## 2019-12-05 MED ORDER — KETOROLAC TROMETHAMINE 60 MG/2ML IM SOLN
60.0000 mg | Freq: Once | INTRAMUSCULAR | Status: AC
  Administered 2019-12-05: 60 mg via INTRAMUSCULAR

## 2019-12-05 MED ORDER — DULOXETINE HCL 30 MG PO CPEP: 30.0000 mg | ORAL_CAPSULE | Freq: Every day | ORAL | 0 refills | Status: DC

## 2019-12-05 NOTE — Patient Instructions (Addendum)
Diane Brooks.  Get labs.  Will refer to GI.   Restart:  Gabapentin Cymbalta Flexeril as needed muscle buspar three times a day.

## 2019-12-05 NOTE — Progress Notes (Signed)
Subjective:    Patient ID: Diane Brooks, female    DOB: 03-27-1955, 64 y.o.   MRN: [id number]  HPI  Pt is a 64 yo female with type 2 diabetes, anxiety, depression, myofascial pain, GERD, hypertension, hyperlipidemia who presents to the clinic with worsening pain.    The back patient is very honest with me.  She has not been taking any of her medications.  She describes incidents where she seemed to have lost her medications and encouraged them to help.  She has been out of them for months.  She endorses pain all over her body. She does have lumbar DDD, facet arthritis.  She is taking Tylenol but it only helps very limited.   She admits close relative who died of Covid.  Was very emotional for her.  With the pandemic she is very scared all the time.  Again she stopped her medication for her mood.  She denies any suicidal thoughts or homicidal thoughts.  She feels very fearful to travel would leave her home.    She continues to have epigastric pain.  She has had an up-to-date colonoscopy in 2020.  She takes Protonix daily.  She denies any melena or hematochezia.  [name] Kitchen. Active Ambulatory Problems    Diagnosis Date Noted  . Hyperlipidemia 03/28/2013  . Hiatal hernia 03/28/2013  . Essential hypertension, benign 03/28/2013  . Diabetes mellitus type II, controlled ([place]) 03/28/2013  . Gastroesophageal reflux disease 05/10/2013  . Globus sensation 05/10/2013  . Atypical chest pain 05/10/2013  . Internal hemorrhoids 05/10/2013  . Decreased libido 05/10/2013  . High risk HPV infection 05/24/2013  . Colon cancer screening 05/25/2013  . Obesity (BMI 30-39.9) 06/27/2013  . Diabetes mellitus with microalbuminuria ([place]) 06/27/2013  . Abnormal weight gain 06/27/2013  . Hyperlipemia 06/27/2013  . Cyst, eyelid sebaceous 06/27/2013  . Onychomycosis 06/27/2013  . 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 Sudoriferous cyst 07/17/2013  . Seasonal allergies 08/03/2013  . Allergic rhinitis 08/03/2013  .  Tinea versicolor 08/03/2013  . Obesity, unspecified 10/29/2013  . Hot flashes 10/29/2013  . Abdominal pain, epigastric 11/09/2013  . Migraine headache without aura 10/16/2015  . De Quervain's disease (radial styloid tenosynovitis) 11/14/2015  . BPV (benign positional vertigo) 11/14/2015  . Polyarthralgia 11/19/2015  . Mouth sores 12/15/2015  . Black stools 12/15/2015  . Light stools 03/03/2016  . Systolic murmur 09/15/9483  . Low serum vitamin B12 04/20/2017  . Memory changes 04/24/2017  . Itching 06/13/2017  . Anxiety 06/13/2017  . Lipoma of left lower extremity 06/13/2017  . Reactive airway disease 07/03/2017  . Persistent cough for 3 weeks or longer 07/03/2017  . Shortness of breath 07/07/2017  . Myofascial pain syndrome 07/14/2017  . Vitamin D deficiency 09/12/2017  . Visit for screening mammogram 09/12/2017  . Microalbuminuria 09/13/2017  . Hypertension associated with diabetes ([place]) 03/07/2018  . Blepharospasm 06/13/2018  . Anxiety as acute reaction to exceptional stress 09/07/2018  . Facet arthritis, degenerative, lumbar spine 04/16/2019  . Spondylosis without myelopathy or radiculopathy, cervical region 04/16/2019  . Left lumbar radiculitis 04/27/2019  . Radiculitis of left cervical region 04/27/2019  . Urinary incontinence 05/18/2019  . Protrusion of lumbar intervertebral disc 05/21/2019  . DDD (degenerative disc disease), lumbar 05/21/2019  . Trochanteric bursitis of left hip 05/29/2019  . Hyperlipidemia associated with type 2 diabetes mellitus ([place]) 12/11/2019  . Acute midline thoracic back pain 12/11/2019   Resolved Ambulatory Problems    Diagnosis Date Noted  . Mid back pain 10/24/2015  .  Left-sided low back pain with left-sided sciatica 10/24/2015  . Neck pain 10/24/2015  . Muscle spasm of back 11/14/2015  . Chronic left-sided low back pain without sciatica 03/09/2018  . DDD (degenerative disc disease), cervical 04/16/2019   Past Medical History:   Diagnosis Date  . Diabetes mellitus without complication ([place])   . Hypertension        Review of Systems See HPI.     Objective:   Physical Exam Vitals reviewed.  Constitutional:      Appearance: Normal appearance.  Cardiovascular:     Rate and Rhythm: Normal rate and regular rhythm.  Pulmonary:     Effort: Pulmonary effort is normal.     Breath sounds: Normal breath sounds.  Musculoskeletal:     Right lower leg: No edema.     Left lower leg: No edema.     Comments: Tenderness over all 18 tender points. NROM of neck, shoulders, knees, hips.   Neurological:     General: No focal deficit present.     Mental Status: She is alert and oriented to person, place, and time.  Psychiatric:     Comments: Tearful and emotional.           .. Depression screen [facility] 2/9 10/26/2019 09/06/2018 07/14/2017 04/19/2017 03/30/2013  Decreased Interest 1 0 3 0 0  Down, Depressed, Hopeless 3 2 1 2  0  PHQ - 2 Score 4 2 4 2  0  Altered sleeping 2 1 3  0 -  Tired, decreased energy 2 0 3 1 -  Change in appetite 1 1 0 3 -  Feeling bad or failure about yourself  3 1 0 2 -  Trouble concentrating 2 0 0 2 -  Moving slowly or fidgety/restless 2 1 3 2  -  Suicidal thoughts 0 0 0 0 -  PHQ-9 Score 16 6 13 12  -  Difficult doing work/chores - Not difficult at all Very difficult - -   .[name] Kitchen GAD 7 : Generalized Anxiety Score 10/26/2019 09/06/2018 07/14/2017  Nervous, Anxious, on Edge 3 2 0  Control/stop worrying 3 1 3   Worry too much - different things 3 1 0  Trouble relaxing 3 2 3   Restless 1 0 0  Easily annoyed or irritable 2 1 3   Afraid - awful might happen 3 1 1   Total GAD 7 Score 18 8 10   Anxiety Difficulty - Not difficult at all Very difficult     Assessment & Plan:  [name] KitchenMarland KitchenCarisa was seen today for pain.  Diagnoses and all orders for this visit:  Myofascial pain syndrome -     CBC with Differential/Platelet -     COMPLETE METABOLIC PANEL WITH GFR -     gabapentin (NEURONTIN) 300 MG capsule; One tab  PO qHS for a week, then BID for a week, then TID. May double weekly to a max of 3,600mg /day -     ketorolac (TORADOL) injection 60 mg -     Ambulatory referral to Psychology  Flu vaccine need -     Flu Vaccine QUAD 36+ mos IM  Encounter for screening mammogram for malignant neoplasm of breast -     MM 3D SCREEN BREAST BILATERAL  Anxiety -     busPIRone (BUSPAR) 5 MG tablet; Take 1 tablet (5 mg total) by mouth 3 (three) times daily. -     B12 and Folate Panel -     VITAMIN D 25 Hydroxy (Vit-D Deficiency, Fractures) -     TSH -  Ambulatory referral to Psychology  Acute midline thoracic back pain -     cyclobenzaprine (FLEXERIL) 5 MG tablet; Take 1 tablet (5 mg total) by mouth 3 (three) times daily as needed for muscle spasms. And back pain  Controlled type 2 diabetes mellitus with diabetic polyneuropathy, without long-term current use of insulin (HCC) -     DULoxetine (CYMBALTA) 30 MG capsule; Take 1 capsule (30 mg total) by mouth daily. -     COMPLETE METABOLIC PANEL WITH GFR  Gastroesophageal reflux disease, unspecified whether esophagitis present -     CBC with Differential/Platelet -     Ambulatory referral to Gastroenterology  Hyperlipidemia associated with type 2 diabetes mellitus ([place]) -     Lipid Panel w/reflex Direct LDL  Memory changes -     B12 and Folate Panel -     VITAMIN D 25 Hydroxy (Vit-D Deficiency, Fractures) -     TSH  Epigastric pain -     Ambulatory referral to Gastroenterology  Acute reaction to situational stress -     Ambulatory referral to Psychology   I certainly think not on any medications have made her anxiety/depression worse and then made her pain worse. Explained to patient this are both linked. She does have some degneration/arthritis in spine and cause for some pain but today everything hurts and I believe a flare of myofascial pain. Discussed medications to restart.   Gabapentin cymbalta Flexeril  buspar  Hold NSAID.  Consider  massages with [facility] at [facility].   Reassured memory changes can occur under periods of extreme stress and pain.   Pt has a lot going on with deaths in family and feeling alone. Referral for counseling made.    For indigestion continue protonix. [place] for pepcid as well. She certainly could have some NSAID induced gastritis. Referral made for endoscopy consideration by GI.   Follow up in 4- 6 weeks.   Spent 40 minutes getting patient back on tract with medications and treatment plan.

## 2019-12-07 ENCOUNTER — Other Ambulatory Visit: Payer: Self-pay | Admitting: Neurology

## 2019-12-07 LAB — CBC WITH DIFFERENTIAL/PLATELET
Absolute Monocytes: 416 cells/uL (ref 200–950)
Basophils Absolute: 21 cells/uL (ref 0–200)
Basophils Relative: 0.4 %
Eosinophils Absolute: 52 cells/uL (ref 15–500)
Eosinophils Relative: 1 %
HCT: 38.7 % (ref 35.0–45.0)
Hemoglobin: 13.1 g/dL (ref 11.7–15.5)
Lymphs Abs: 1186 cells/uL (ref 850–3900)
MCH: 31 pg (ref 27.0–33.0)
MCHC: 33.9 g/dL (ref 32.0–36.0)
MCV: 91.5 fL (ref 80.0–100.0)
MPV: 10.2 fL (ref 7.5–12.5)
Monocytes Relative: 8 %
Neutro Abs: 3526 cells/uL (ref 1500–7800)
Neutrophils Relative %: 67.8 %
Platelets: 210 10*3/uL (ref 140–400)
RBC: 4.23 10*6/uL (ref 3.80–5.10)
RDW: 12.5 % (ref 11.0–15.0)
Total Lymphocyte: 22.8 %
WBC: 5.2 10*3/uL (ref 3.8–10.8)

## 2019-12-07 LAB — COMPLETE METABOLIC PANEL WITH GFR
AG Ratio: 1.7 (calc) (ref 1.0–2.5)
ALT: 27 U/L (ref 6–29)
AST: 21 U/L (ref 10–35)
Albumin: 4.1 g/dL (ref 3.6–5.1)
Alkaline phosphatase (APISO): 56 U/L (ref 37–153)
BUN: 18 mg/dL (ref 7–25)
CO2: 30 mmol/L (ref 20–32)
Calcium: 9.1 mg/dL (ref 8.6–10.4)
Chloride: 101 mmol/L (ref 98–110)
Creat: 0.73 mg/dL (ref 0.50–0.99)
GFR, Est African American: 102 mL/min/{1.73_m2} (ref >=60)
GFR, Est Non African American: 88 mL/min/{1.73_m2} (ref >=60)
Globulin: 2.4 g/dL (calc) (ref 1.9–3.7)
Glucose, Bld: 117 mg/dL — ABNORMAL HIGH (ref 65–99)
Potassium: 4.1 mmol/L (ref 3.5–5.3)
Sodium: 137 mmol/L (ref 135–146)
Total Bilirubin: 0.5 mg/dL (ref 0.2–1.2)
Total Protein: 6.5 g/dL (ref 6.1–8.1)

## 2019-12-07 LAB — LIPID PANEL W/REFLEX DIRECT LDL
Cholesterol: 170 mg/dL (ref <200)
HDL: 63 mg/dL (ref >=50)
LDL Cholesterol (Calc): 87 mg/dL (calc)
Non-HDL Cholesterol (Calc): 107 mg/dL (calc) (ref <130)
Total CHOL/HDL Ratio: 2.7 (calc) (ref <5.0)
Triglycerides: 103 mg/dL (ref <150)

## 2019-12-07 LAB — VITAMIN D 25 HYDROXY (VIT D DEFICIENCY, FRACTURES): Vit D, 25-Hydroxy: 23 ng/mL — ABNORMAL LOW (ref 30–100)

## 2019-12-07 LAB — TSH: TSH: 0.54 mIU/L (ref 0.40–4.50)

## 2019-12-07 LAB — B12 AND FOLATE PANEL
Folate: 10.7 ng/mL
Vitamin B-12: 347 pg/mL (ref 200–1100)

## 2019-12-07 MED ORDER — ATORVASTATIN CALCIUM 40 MG PO TABS: 40.0000 mg | ORAL_TABLET | Freq: Every day | ORAL | 3 refills | Status: DC

## 2019-12-07 NOTE — Progress Notes (Signed)
Increase vitamin D to 2000 units daily.  Ok to send lipitor 40mg  one tablet by mouth #90 with 3 refills.

## 2019-12-07 NOTE — Progress Notes (Signed)
Diane Brooks,   Cholesterol is good but with diabetes we do want your LDL under 70. You are not quite to goal. Would you be ok with increasing your lipitor to 40mg  daily?   Kidney, liver look great.   B12 low normal. At least take 1012mcg daily.  Vitamin D low. How much daily vitamin D are you taking?

## 2019-12-11 ENCOUNTER — Encounter: Payer: Self-pay | Admitting: Physician Assistant

## 2019-12-11 DIAGNOSIS — E1169 Type 2 diabetes mellitus with other specified complication: Secondary | ICD-10-CM | POA: Insufficient documentation

## 2019-12-11 DIAGNOSIS — E785 Hyperlipidemia, unspecified: Secondary | ICD-10-CM | POA: Insufficient documentation

## 2019-12-11 DIAGNOSIS — M546 Pain in thoracic spine: Secondary | ICD-10-CM | POA: Insufficient documentation

## 2019-12-17 ENCOUNTER — Telehealth: Payer: Self-pay | Admitting: Neurology

## 2019-12-17 NOTE — Telephone Encounter (Signed)
Patient came by the office today after seeing GI.  She is confused about what they have told her and medications.  She states they told her to stop Metformin.  They gave her two medications to start: Metoclopram 5 mg TID for 14 days and Xifaxan 550 mg 1 TID for 14 days. She doesn't want to take these because of potential side effects. I did let her know she needs to discuss these medications they prescribed with their office.   From their summary:  Assessment   1. Nausea and vomiting, intractability of vomiting not specified, unspecified vomiting type  2. Bloating  3. Gastroesophageal reflux disease, unspecified whether esophagitis present   64 year old presents with symptoms of chronic bloating, nausea without vomiting. Associated symptoms include abdominal distention. Differentials include foregut bacterial overgrowth, gastroparesis, H pylori gastritis, hiatal hernia. Patient does report having a positive hydrogen breath test in the Past but it appears that she was never treated. Risk factors for bacterial overgrowth include long-standing history of diabetes, chronic PPI use. Will treat empirically with a course of oral Xifaxan. Patient also instructed to avoid common food triggers including dairy, fatty foods, greasy foods. Patient also may have symptoms related to chronic use of metformin. Patient instructed to discuss this with her PCP and to consider an alternative. Patient verbalized understanding. Recommend close follow-up in 1 month. Patient also instructed to bring in her prior procedure reports.  Please advise.

## 2019-12-19 ENCOUNTER — Encounter: Payer: Self-pay | Admitting: Orthopaedic Surgery

## 2019-12-19 ENCOUNTER — Ambulatory Visit (INDEPENDENT_AMBULATORY_CARE_PROVIDER_SITE_OTHER): Payer: 59 | Admitting: Orthopaedic Surgery

## 2019-12-19 VITALS — BP 155/87 | HR 65

## 2019-12-19 DIAGNOSIS — M47812 Spondylosis without myelopathy or radiculopathy, cervical region: Secondary | ICD-10-CM

## 2019-12-19 NOTE — Progress Notes (Signed)
Office Visit Note   Patient: Diane Brooks           Date of Birth: 1955/04/05           MRN: [id number] Visit Date: 12/19/2019              Requested by: Donella Stade, PA-C [place],  [place] [address] PCP: Donella Stade, PA-C   Assessment & Plan: Visit Diagnoses:  1. Spondylosis without myelopathy or radiculopathy, cervical region     Plan: Patient states the pain in her neck and her shoulder blade into her left arm down to her hand is severe she is unable to tolerate it and states she has to proceed with an MRI scan.  She has had symptoms greater than 3 months with failure to respond to anti-inflammatories, gabapentin, Mobic, Flexeril, Cymbalta.  She has been through therapy exercises without relief.  Patient request proceeding with cervical MRI scan.  Office follow-up after cervical MRI.  Patient states that her insurance is not likely to cover it and she wants to shop and check where she can get it the most reasonable for cash price.  Follow-up after  Cervical MRI scan.  Follow-Up Instructions: No follow-ups on file.   Orders:  Orders Placed This Encounter  Procedures  . MR Cervical Spine w/o contrast  . Ambulatory referral to ENT   No orders of the defined types were placed in this encounter.     Procedures: No procedures performed   Clinical Data: No additional findings.   Subjective: Chief Complaint  Patient presents with  . Neck - Pain  . Lower Back - Pain    HPI 64 year old female returns with continued neck pain that radiates between the shoulder blades as well as low back pain.  She has more problems on the left side than right both neck and lower back.  She has some right foraminal disc protrusion on lumbar MRI but is having primarily left low back pain that radiates to the buttocks.  She is having problems with headaches with a tender area just below her jawline without palpable lymph node enlargement.  She states she  has associated headaches they are on the top of her head.  She is requesting an ENT referral about jaw pain here for ear pain and hand pain.  Patient is a diabetic on Metformin.  She has been on Cymbalta, Neurontin, Flexeril, currently BuSpar with ongoing symptoms.  Review of Systems 14 point review of systems updated unchanged from 05/29/2019 other than as mentioned in HPI.   Objective: Vital Signs: BP (!) 155/87   Pulse 65   Physical Exam Constitutional:      Appearance: She is well-developed.  HENT:     Head: Normocephalic.     Right Ear: External ear normal.     Left Ear: External ear normal.  Eyes:     Pupils: Pupils are equal, round, and reactive to light.  Neck:     Thyroid: No thyromegaly.     Trachea: No tracheal deviation.  Cardiovascular:     Rate and Rhythm: Normal rate.  Pulmonary:     Effort: Pulmonary effort is normal.  Abdominal:     Palpations: Abdomen is soft.  Skin:    General: Skin is warm and dry.  Neurological:     Mental Status: She is alert and oriented to person, place, and time.  Psychiatric:        Behavior: Behavior normal.  Ortho Exam patient has brachial plexus tenderness worse on the left than right.  Biceps triceps is strong no shoulder impingement.  Decreased sensation radial side of her hand on the left normal on the right.  No thenar or hyperthenar atrophy.  Median nerve at the wrist ulnar nerve at the elbow is normal to exam.  Specialty Comments:  No specialty comments available.  Imaging: CLINICAL DATA:  Cervicalgia with left-sided radicular symptoms  EXAM: CERVICAL SPINE - COMPLETE 4+ VIEW  COMPARISON:  October 22, 2015  FINDINGS: Frontal, lateral, open-mouth odontoid, and bilateral oblique views were obtained. There is no fracture or spondylolisthesis. Prevertebral soft tissues and predental space regions are normal. There is moderate disc space narrowing at C4-5 and C5-6. There is a prominent anterior osteophyte along the  inferior aspect of C5. There is calcification in the anterior ligament at C4-5 as well as to a lesser extent at C5-6 and C6-7. There is mild facet hypertrophy with exit foraminal narrowing at C4-5 bilaterally and at C5-6 on the right. Lung apices are clear.  IMPRESSION: Areas of osteoarthritic change as noted, primarily at C4-5 and C5-6. Appearance similar to prior study. No fracture or spondylolisthesis.   Electronically Signed   By: Lowella Grip III M.D.   On: 04/13/2019 11:54    PMFS History: Patient Active Problem List   Diagnosis Date Noted  . Hyperlipidemia associated with type 2 diabetes mellitus ([place]) 12/11/2019  . Acute midline thoracic back pain 12/11/2019  . Trochanteric bursitis of left hip 05/29/2019  . Protrusion of lumbar intervertebral disc 05/21/2019  . DDD (degenerative disc disease), lumbar 05/21/2019  . Urinary incontinence 05/18/2019  . Left lumbar radiculitis 04/27/2019  . Radiculitis of left cervical region 04/27/2019  . Facet arthritis, degenerative, lumbar spine 04/16/2019  . Spondylosis without myelopathy or radiculopathy, cervical region 04/16/2019  . Anxiety as acute reaction to exceptional stress 09/07/2018  . Blepharospasm 06/13/2018  . Hypertension associated with diabetes ([place]) 03/07/2018  . Microalbuminuria 09/13/2017  . Vitamin D deficiency 09/12/2017  . Visit for screening mammogram 09/12/2017  . Myofascial pain syndrome 07/14/2017  . Shortness of breath 07/07/2017  . Reactive airway disease 07/03/2017  . Persistent cough for 3 weeks or longer 07/03/2017  . Itching 06/13/2017  . Anxiety 06/13/2017  . Lipoma of left lower extremity 06/13/2017  . Memory changes 04/24/2017  . Low serum vitamin B12 04/20/2017  . Systolic murmur 63/03/6008  . Light stools 03/03/2016  . Mouth sores 12/15/2015  . Black stools 12/15/2015  . Polyarthralgia 11/19/2015  . De Quervain's disease (radial styloid tenosynovitis) 11/14/2015  . BPV (benign  positional vertigo) 11/14/2015  . Migraine headache without aura 10/16/2015  . Abdominal pain, epigastric 11/09/2013  . Obesity, unspecified 10/29/2013  . Hot flashes 10/29/2013  . Seasonal allergies 08/03/2013  . Allergic rhinitis 08/03/2013  . Tinea versicolor 08/03/2013  . Sudoriferous cyst 07/17/2013  . 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 Obesity (BMI 30-39.9) 06/27/2013  . Diabetes mellitus with microalbuminuria ([place]) 06/27/2013  . Abnormal weight gain 06/27/2013  . Hyperlipemia 06/27/2013  . Cyst, eyelid sebaceous 06/27/2013  . Onychomycosis 06/27/2013  . Colon cancer screening 05/25/2013  . High risk HPV infection 05/24/2013  . Gastroesophageal reflux disease 05/10/2013  . Globus sensation 05/10/2013  . Atypical chest pain 05/10/2013  . Internal hemorrhoids 05/10/2013  . Decreased libido 05/10/2013  . Hyperlipidemia 03/28/2013  . Hiatal hernia 03/28/2013  . Essential hypertension, benign 03/28/2013  . Diabetes mellitus type II, controlled ([place]) 03/28/2013   Past Medical  History:  Diagnosis Date  . Diabetes mellitus without complication ([place])   . Hyperlipidemia   . Hypertension     Family History  Problem Relation Age of Onset  . Hyperlipidemia Mother   . Stroke Mother   . Diabetes Father   . Hyperlipidemia Father   . Hypertension Father   . Diabetes Maternal Grandfather   . Hyperlipidemia Maternal Grandfather   . Diabetes Paternal Grandfather   . Hyperlipidemia Paternal Grandfather     Past Surgical History:  Procedure Laterality Date  . BREAST ENHANCEMENT SURGERY    . ESOPHAGEAL MANOMETRY N/A 07/02/2013   Procedure: ESOPHAGEAL MANOMETRY (EM);  Surgeon: Jerene Bears, MD;  Location: [facility];  Service: Gastroenterology;  Laterality: N/A;  . TONSILECTOMY/ADENOIDECTOMY WITH MYRINGOTOMY    . TUBAL LIGATION    . tummy tuck     Social History   Occupational History  . Occupation: Retired  Tobacco Use  . Smoking status: Former Smoker    Quit date:  03/23/1991    Years since quitting: 28.7  . Smokeless tobacco: Never Used  Substance and Sexual Activity  . Alcohol use: No  . Drug use: No  . Sexual activity: Yes    Birth control/protection: Surgical, Post-menopausal

## 2019-12-21 MED ORDER — SAXAGLIPTIN HCL 5 MG PO TABS: 5.0000 mg | ORAL_TABLET | Freq: Every day | ORAL | 0 refills | Status: DC

## 2019-12-21 NOTE — Addendum Note (Signed)
Addended by: Donella Stade on: 12/21/2019 03:26 PM   Modules accepted: Orders

## 2019-12-21 NOTE — Telephone Encounter (Signed)
  Lab Results  Component Value Date   HGBA1C 5.9 (A) 10/26/2019   Discussed plan from GI.   Stop metformin per GI note.  Start 5mg  of onglyza daily to continue to help sugar control since stopping metformin. Pt is concerned about this.  Start xifaxan that GI gave her.

## 2019-12-24 ENCOUNTER — Ambulatory Visit (INDEPENDENT_AMBULATORY_CARE_PROVIDER_SITE_OTHER): Payer: 59 | Admitting: Obstetrics & Gynecology

## 2019-12-24 ENCOUNTER — Encounter: Payer: Self-pay | Admitting: Obstetrics & Gynecology

## 2019-12-24 ENCOUNTER — Other Ambulatory Visit: Payer: Self-pay

## 2019-12-24 ENCOUNTER — Other Ambulatory Visit (HOSPITAL_COMMUNITY)
Admission: RE | Admit: 2019-12-24 | Discharge: 2019-12-24 | Disposition: A | Discharge status: Home / Self Care | Payer: 59 | Source: Ambulatory Visit | Attending: Obstetrics & Gynecology | Admitting: Obstetrics & Gynecology

## 2019-12-24 VITALS — BP 135/78 | HR 69 | Resp 16 | Ht 62.0 in | Wt 174.0 lb

## 2019-12-24 DIAGNOSIS — Z01419 Encounter for gynecological examination (general) (routine) without abnormal findings: Secondary | ICD-10-CM | POA: Insufficient documentation

## 2019-12-24 DIAGNOSIS — R87610 Atypical squamous cells of undetermined significance on cytologic smear of cervix (ASC-US): Secondary | ICD-10-CM | POA: Diagnosis not present

## 2019-12-24 DIAGNOSIS — R87810 Cervical high risk human papillomavirus (HPV) DNA test positive: Secondary | ICD-10-CM

## 2019-12-24 DIAGNOSIS — R87618 Other abnormal cytological findings on specimens from cervix uteri: Secondary | ICD-10-CM

## 2019-12-24 NOTE — Progress Notes (Signed)
Subjective:     Diane Brooks is a 64 y.o. female here for a routine exam.  Current complaints: None.  Had D & C for PMB at [facility] last year.  I don't see the pathology and can see th epost op note in the office.  No issues were noted.  Pt did have ASCUS HPV positive.  Colpo was done at the time of D & C.  Pt was told to get a pap in a year.  No more PMB since D & C.     Gynecologic History No LMP recorded. Patient is postmenopausal. Contraception: post menopausal status Last Pap: 2020. Results were: ASCUS with HPV positive Last mammogram: 2019. Results were: normal  Obstetric History OB History  Gravida Para Term Preterm AB Living  3 2     1 2   SAB TAB Ectopic Multiple Live Births  1            # Outcome Date GA Lbr Len/2nd Weight Sex Delivery Anes PTL Lv  3 SAB           2 Para           1 Para              The following portions of the patient's history were reviewed and updated as appropriate: allergies, current medications, past family history, past medical history, past social history, past surgical history and problem list.  Review of Systems Pertinent items noted in HPI and remainder of comprehensive ROS otherwise negative.    Objective:      Vitals:   12/24/19 1312  BP: 135/78  Pulse: 69  Resp: 16  Weight: 174 lb (78.9 kg)  Height: 5\' 2"  (1.575 m)   Vitals:  WNL General appearance: alert, cooperative and no distress  HEENT: Normocephalic, without obvious abnormality, atraumatic Eyes: negative Throat: lips, mucosa, and tongue normal; teeth and gums normal  Respiratory: Clear to auscultation bilaterally  CV: Regular rate and rhythm  Breasts:  Normal appearance, no masses or tenderness, no nipple retraction or dimpling  GI: Soft, non-tender; bowel sounds normal; no masses,  no organomegaly  GU: External Genitalia:  Tanner V, no lesion Urethra:  No prolapse   Vagina: Pale pink, atrophic, no blood or discharge  Cervix: No CMT, no lesion  Uterus:  Enlarged size  c/wa fibroids, non tender  Adnexa: Normal, no masses, non tender  Musculoskeletal: No edema, redness or tenderness in the calves or thighs  Skin: No lesions or rash  Lymphatic: Axillary adenopathy: none     Psychiatric: Normal mood and behavior        Assessment:    Healthy female exam.    Plan:    pap with cotesting Mammogram already ordered Normal BMD in 2017 Normal colonoscopy per patient in 2020  Pt has mychart and will communicate about pap smear through my chart.

## 2019-12-26 LAB — CYTOLOGY - PAP
Comment: NEGATIVE
Diagnosis: UNDETERMINED — AB
High risk HPV: POSITIVE — AB

## 2019-12-28 ENCOUNTER — Ambulatory Visit (INDEPENDENT_AMBULATORY_CARE_PROVIDER_SITE_OTHER): Payer: 59 | Admitting: Physician Assistant

## 2019-12-28 ENCOUNTER — Other Ambulatory Visit: Payer: Self-pay

## 2019-12-28 ENCOUNTER — Encounter: Payer: Self-pay | Admitting: Physician Assistant

## 2019-12-28 VITALS — BP 157/69 | HR 86 | Ht 62.0 in | Wt 175.0 lb

## 2019-12-28 DIAGNOSIS — K219 Gastro-esophageal reflux disease without esophagitis: Secondary | ICD-10-CM

## 2019-12-28 DIAGNOSIS — G43011 Migraine without aura, intractable, with status migrainosus: Secondary | ICD-10-CM

## 2019-12-28 DIAGNOSIS — E538 Deficiency of other specified B group vitamins: Secondary | ICD-10-CM | POA: Diagnosis not present

## 2019-12-28 DIAGNOSIS — I10 Essential (primary) hypertension: Secondary | ICD-10-CM | POA: Diagnosis not present

## 2019-12-28 DIAGNOSIS — E1142 Type 2 diabetes mellitus with diabetic polyneuropathy: Secondary | ICD-10-CM

## 2019-12-28 MED ORDER — CYANOCOBALAMIN 1000 MCG/ML IJ SOLN
1000.0000 ug | Freq: Once | INTRAMUSCULAR | Status: AC
  Administered 2019-12-28: 1000 ug via INTRAMUSCULAR

## 2019-12-28 NOTE — Progress Notes (Signed)
Subjective:    Patient ID: Diane Brooks, female    DOB: 1955/04/23, 64 y.o.   MRN: [id number]  HPI  Pt is a 64 yo female with T2DM, HTN, GERD who presents to the clinic for follow up.   She recently saw GI and neurology and here for updates.   GI started her on xifaxin and stopped metformin. She is doing a lot better. She did not start reglan.   Pt is worried about her sugars off metformin. This morning 117.    Neurology- for headaches.   Assessment and Plan:  1. Intractable chronic migraine without aura and without status migrainosus (Primary) - Erenumab-aooe 140 MG/ML SOAJ; Inject 140 mg into the skin every 30 (thirty) days., Starting Thu 12/27/2019, Until Sat 12/27/2020, Normal - Ambulatory referral to Sleep Consult 2. Medication overuse headache 3. Witnessed episode of apnea - Ambulatory referral to Sleep Consult  Analgesic rebound headache presented both verbally and in written form. Discussed the effect of daily use of analgesics on the progression of headache pain, the use of daily analgesics and the increased incidence of making headaches resistant to preventative treatment. I have asked for downward tapering use of daily analgesics over the next 6-8 weeks. Understanding is verbalized that headaches may increase in frequency and intensity while tapering off daily analgesic use.   Stop Tylenol  We discussed given benign physical examination and longstanding symptoms, neuroimaging can be deferred. If there is worsening despite prophylactic treatment, MRI Brain w/wo Gad can be performed.  Begin Aimovig 140 mg sq monthly Demonstrated auto-injector and answered all questions offered with return demonstration and understanding verbalized  Continue flexeril 5 mg as needed  Headache diary  Sleep referral re-witnessed apnea  Follow up in about 4 months (around 04/28/2020) for migraine. Pt requests to be re-scheduled with the neurologist in the office. Appointment made with Dr  Ninfa Meeker  In the office today, we reviewed non-pharmacological headache management techniques, including regular meal & sleep times, exercise and stress management. Next, we discussed symptomatic and prophylactic treatment options. We agreed to proceed with the regimen listed.  All new prescription medications, changes in current prescription dosages, and sample medications were discussed with the patient, including patient education, medication name, use, dosage, potential side effects, drug interactions, consequences of not using/taking and special instructions. Patient expressed understanding. Barriers to adherence discussed if applicable.  Patient personal data form reviewed.  Risks, benefits, and alternatives of the medications and treatment plan prescribed today were discussed, and patient expressed understanding. Plan follow-up as discussed or as needed if any worsening symptoms or change in condition of instructions given.     .. Active Ambulatory Problems    Diagnosis Date Noted  . Hyperlipidemia 03/28/2013  . Hiatal hernia 03/28/2013  . Essential hypertension, benign 03/28/2013  . Diabetes mellitus type II, controlled ([place]) 03/28/2013  . Gastroesophageal reflux disease 05/10/2013  . Globus sensation 05/10/2013  . Atypical chest pain 05/10/2013  . Internal hemorrhoids 05/10/2013  . Decreased libido 05/10/2013  . High risk HPV infection 05/24/2013  . Colon cancer screening 05/25/2013  . Obesity (BMI 30-39.9) 06/27/2013  . Diabetes mellitus with microalbuminuria ([place]) 06/27/2013  . Abnormal weight gain 06/27/2013  . Hyperlipemia 06/27/2013  . Cyst, eyelid sebaceous 06/27/2013  . Onychomycosis 06/27/2013  . 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 Sudoriferous cyst 07/17/2013  . Seasonal allergies 08/03/2013  . Allergic rhinitis 08/03/2013  . Tinea versicolor 08/03/2013  . Obesity, unspecified 10/29/2013  . Hot flashes 10/29/2013  . Abdominal pain, epigastric  11/09/2013  .  Migraine headache without aura 10/16/2015  . De Quervain's disease (radial styloid tenosynovitis) 11/14/2015  . BPV (benign positional vertigo) 11/14/2015  . Polyarthralgia 11/19/2015  . Mouth sores 12/15/2015  . Black stools 12/15/2015  . Light stools 03/03/2016  . Systolic murmur 06/10/2246  . Low serum vitamin B12 04/20/2017  . Memory changes 04/24/2017  . Itching 06/13/2017  . Anxiety 06/13/2017  . Lipoma of left lower extremity 06/13/2017  . Reactive airway disease 07/03/2017  . Persistent cough for 3 weeks or longer 07/03/2017  . Shortness of breath 07/07/2017  . Myofascial pain syndrome 07/14/2017  . Vitamin D deficiency 09/12/2017  . Visit for screening mammogram 09/12/2017  . Microalbuminuria 09/13/2017  . Hypertension associated with diabetes ([place]) 03/07/2018  . Blepharospasm 06/13/2018  . Anxiety as acute reaction to exceptional stress 09/07/2018  . Facet arthritis, degenerative, lumbar spine 04/16/2019  . Spondylosis without myelopathy or radiculopathy, cervical region 04/16/2019  . Left lumbar radiculitis 04/27/2019  . Radiculitis of left cervical region 04/27/2019  . Urinary incontinence 05/18/2019  . Protrusion of lumbar intervertebral disc 05/21/2019  . DDD (degenerative disc disease), lumbar 05/21/2019  . Trochanteric bursitis of left hip 05/29/2019  . Hyperlipidemia associated with type 2 diabetes mellitus ([place]) 12/11/2019  . Acute midline thoracic back pain 12/11/2019   Resolved Ambulatory Problems    Diagnosis Date Noted  . Mid back pain 10/24/2015  . Left-sided low back pain with left-sided sciatica 10/24/2015  . Neck pain 10/24/2015  . Muscle spasm of back 11/14/2015  . Chronic left-sided low back pain without sciatica 03/09/2018  . DDD (degenerative disc disease), cervical 04/16/2019   Past Medical History:  Diagnosis Date  . Diabetes mellitus without complication ([place])   . Hypertension       Review of Systems See HPI.     Objective:    Physical Exam Vitals reviewed.  Constitutional:      Appearance: Normal appearance.  Cardiovascular:     Rate and Rhythm: Normal rate and regular rhythm.     Pulses: Normal pulses.  Pulmonary:     Effort: Pulmonary effort is normal.  Neurological:     General: No focal deficit present.     Mental Status: She is alert and oriented to person, place, and time.  Psychiatric:        Mood and Affect: Mood normal.           Assessment & Plan:  .[name] KitchenMarland KitchenRosary was seen today for follow-up.  Diagnoses and all orders for this visit:  Essential hypertension, benign  Controlled type 2 diabetes mellitus with diabetic polyneuropathy, without long-term current use of insulin (HCC)  Low serum vitamin B12 -     cyanocobalamin ((VITAMIN B-12)) injection 1,000 mcg  Intractable migraine without aura and with status migrainosus  Gastroesophageal reflux disease without esophagitis   Continue onglyza 5mg  daily. Will recheck A!C in 3 months after change.  Work with neurology to get aimovig approved for headaches.  Complete sleep apnea testing.  Increased losartan to 2 tablets for 50mg  for BP lowering.  B12 low serum taking oral with no increase. IM injection given today.   Follow up in 1 month on BP.

## 2019-12-28 NOTE — Patient Instructions (Addendum)
Continue on onglyza 5mg  daily.  Hopefully aimovig to get approved for headaches.  Get sleep apnea testing.  Increase losartan to 2 tablets equal 50mg  daily.

## 2019-12-31 ENCOUNTER — Ambulatory Visit (INDEPENDENT_AMBULATORY_CARE_PROVIDER_SITE_OTHER): Payer: 59 | Admitting: Obstetrics & Gynecology

## 2019-12-31 ENCOUNTER — Other Ambulatory Visit: Payer: Self-pay

## 2019-12-31 ENCOUNTER — Other Ambulatory Visit: Payer: Self-pay | Admitting: Obstetrics & Gynecology

## 2019-12-31 DIAGNOSIS — B977 Papillomavirus as the cause of diseases classified elsewhere: Secondary | ICD-10-CM

## 2019-12-31 DIAGNOSIS — R85610 Atypical squamous cells of undetermined significance on cytologic smear of anus (ASC-US): Secondary | ICD-10-CM

## 2019-12-31 NOTE — Progress Notes (Signed)
Colposcopy Procedure Note  Pt brought 2 notebooks of old records.  We reviewed together.    Indications: Pap smear 1 months ago showed: ASCUS with POSITIVE high risk HPV. The prior pap showed +HPV (at [facility]).  Prior cervical/vaginal disease: normal exam without visible pathology (I see op note at [facility] but no pathology in Care Everywhere.  Post op appt at [facility] does not mention that further treatment necessary.  Pt was told her pap smear was needed in one year. Prior cervical treatment: no treatment.  Procedure Details  The risks and benefits of the procedure and Written informed consent obtained.  Speculum placed in vagina and excellent visualization of cervix achieved, cervix swabbed x 3 with acetic acid solution.  Findings: Cervix: acetowhite lesion(s) noted at 7 o'clock; cervix swabbed with Lugol's solution and SCJ visualized - lesion at 7 o'clock heading into the canal. Vaginal inspection: vaginal colposcopy not performed. Vulvar colposcopy: vulvar colposcopy not performed.  Specimens: biopsy at 7'oclock and ECC  Complications: none.  Plan: Specimens labelled and sent to Pathology. Will base further treatment on Pathology findings.

## 2019-12-31 NOTE — Addendum Note (Signed)
Addended by: Lyndal Rainbow on: 12/31/2019 04:58 PM   Modules accepted: Orders

## 2020-01-02 ENCOUNTER — Other Ambulatory Visit: Payer: 59

## 2020-01-02 ENCOUNTER — Ambulatory Visit (INDEPENDENT_AMBULATORY_CARE_PROVIDER_SITE_OTHER): Payer: 59

## 2020-01-02 ENCOUNTER — Other Ambulatory Visit: Payer: Self-pay

## 2020-01-02 DIAGNOSIS — Z1231 Encounter for screening mammogram for malignant neoplasm of breast: Secondary | ICD-10-CM | POA: Diagnosis not present

## 2020-01-07 ENCOUNTER — Encounter: Payer: Self-pay | Admitting: Obstetrics & Gynecology

## 2020-01-07 ENCOUNTER — Telehealth: Payer: Self-pay

## 2020-01-07 NOTE — Telephone Encounter (Signed)
Did she start the aimovig? She has already been to neurology. She saw the NP there she can request to see the MD if she like.

## 2020-01-07 NOTE — Progress Notes (Signed)
Normal mammogram follow up in 1 year.

## 2020-01-07 NOTE — Telephone Encounter (Signed)
Diane Brooks called and left a message stating she keeps having migraines. She would like to be referred to Neurology.

## 2020-01-07 NOTE — Telephone Encounter (Signed)
She states she went to Headache and Sleep Medication. She states she wants to go to Neurology only. The office will not schedule without a referral. She insists even after I advised the office is for Neurology.

## 2020-01-07 NOTE — Telephone Encounter (Signed)
[place] for [employer] neurology

## 2020-01-09 ENCOUNTER — Ambulatory Visit
Admission: RE | Admit: 2020-01-09 | Discharge: 2020-01-09 | Disposition: A | Discharge status: Home / Self Care | Payer: 59 | Source: Ambulatory Visit | Attending: Orthopaedic Surgery | Admitting: Orthopaedic Surgery

## 2020-01-09 ENCOUNTER — Other Ambulatory Visit: Payer: Self-pay

## 2020-01-09 DIAGNOSIS — M47812 Spondylosis without myelopathy or radiculopathy, cervical region: Secondary | ICD-10-CM

## 2020-01-10 ENCOUNTER — Other Ambulatory Visit: Payer: Self-pay | Admitting: Orthopaedic Surgery

## 2020-01-10 ENCOUNTER — Telehealth: Payer: Self-pay

## 2020-01-10 ENCOUNTER — Telehealth: Payer: Self-pay | Admitting: Orthopaedic Surgery

## 2020-01-10 ENCOUNTER — Telehealth: Payer: Self-pay | Admitting: Physician Assistant

## 2020-01-10 DIAGNOSIS — E119 Type 2 diabetes mellitus without complications: Secondary | ICD-10-CM

## 2020-01-10 MED ORDER — FREESTYLE LITE TEST VI STRP: ORAL_STRIP | 1 refills | Status: DC

## 2020-01-10 MED ORDER — TRAMADOL HCL 50 MG PO TABS
50.0000 mg | ORAL_TABLET | Freq: Two times a day (BID) | ORAL | 0 refills | Status: DC | PRN
Start: 2020-01-10 — End: 2020-01-16

## 2020-01-10 MED ORDER — RELION LANCETS ULTRA-THIN 30G MISC: 1.0000 | 1 refills | Status: AC | PRN

## 2020-01-10 NOTE — Telephone Encounter (Signed)
Please advise 

## 2020-01-10 NOTE — Telephone Encounter (Signed)
Pt called wanting an appt with Dr.Leggett for "stomach pain". I offered pt next available appt with Dr.Leggett and informed pt that she should see her PCP for stomach pain and they would be able to give her a sooner appt. Pt states it is not "stomach pain" anymore but, it is "swelling and she is in pain". Pt's records were sent and she also wants to know when she can discuss her records with Dr.Leggett. I informed her that she could do that when she comes in for her appt. Pt then got aggravated and said "don't worry about it. This is too much aggravation to help my pain" and proceed to hang up the phone and end the call.

## 2020-01-10 NOTE — Telephone Encounter (Signed)
I called to pharmacy. I called patient advised.

## 2020-01-10 NOTE — Telephone Encounter (Signed)
Pt requested refill of  Reli On ultra thin lancets and free style test strips.

## 2020-01-10 NOTE — Telephone Encounter (Signed)
Sent!

## 2020-01-10 NOTE — Telephone Encounter (Signed)
Ok for [employer] # [id number]  one po bid prn no refills. thanks

## 2020-01-10 NOTE — Telephone Encounter (Signed)
Pt called stating she had her MRI done on 01/07/20 and her review appt isn't until 01/15/20 and her pain levels continue to rise so she would like to know if she could get something called in to last her to  01/15/20?  [phone]

## 2020-01-15 ENCOUNTER — Ambulatory Visit (INDEPENDENT_AMBULATORY_CARE_PROVIDER_SITE_OTHER): Payer: 59 | Admitting: Orthopaedic Surgery

## 2020-01-15 ENCOUNTER — Other Ambulatory Visit: Payer: Self-pay

## 2020-01-15 ENCOUNTER — Encounter: Payer: Self-pay | Admitting: Orthopaedic Surgery

## 2020-01-15 ENCOUNTER — Other Ambulatory Visit: Payer: Self-pay | Admitting: Neurology

## 2020-01-15 VITALS — BP 135/82 | Ht 62.0 in | Wt 175.0 lb

## 2020-01-15 DIAGNOSIS — M47812 Spondylosis without myelopathy or radiculopathy, cervical region: Secondary | ICD-10-CM | POA: Diagnosis not present

## 2020-01-15 DIAGNOSIS — M7062 Trochanteric bursitis, left hip: Secondary | ICD-10-CM

## 2020-01-15 MED ORDER — LOSARTAN POTASSIUM 50 MG PO TABS: 50.0000 mg | ORAL_TABLET | Freq: Every day | ORAL | 0 refills | Status: DC

## 2020-01-15 NOTE — Progress Notes (Signed)
Office Visit Note   Patient: Diane Brooks           Date of Birth: [birth year]           MRN: [id number] Visit Date: 01/15/2020              Requested by: Donella Stade, PA-C [address] [place],  [place] [id number] PCP: Donella Stade, PA-C   Assessment & Plan: Visit Diagnoses:  1. Trochanteric bursitis, left hip   2. Spondylosis without myelopathy or radiculopathy, cervical region     Plan: Trochanteric injection performed.  We will set her up for some physical therapy for her neck and lower back.  We discussed talking with her PCP about taking some medication for depression which may help her pain symptoms.  Follow-up as needed.  Follow-Up Instructions: No follow-ups on file.   Orders:  Orders Placed This Encounter  Procedures  . Large Joint Inj: L greater trochanter   No orders of the defined types were placed in this encounter.     Procedures: Large Joint Inj: L greater trochanter on 01/15/2020 11:10 AM Details: lateral approach Medications: 0.5 mL lidocaine 1 %; 2 mL bupivacaine 0.25 %; 40 mg methylPREDNISolone acetate 40 MG/ML      Clinical Data: No additional findings.   Subjective: Chief Complaint  Patient presents with  . Neck - Pain, Follow-up    MRI Cervical Spine Review    HPI 64 year old female states she is on working as musculoskeletal disability and states she cannot take the pain any longer with pain in her neck. She also has pain in her lumbar spine history of myofascial pain syndrome.  Patient has had past history of facial pain, thoracic pain, atypical chest pain, anxiety, type 2 diabetes.  Cervical MRI scan done 01/09/2020 is available for review.  This shows left foraminal disc osteophyte complex at C3-4 with moderate left foraminal stenosis.  She also has some disc degeneration with osteophytes at C4-5 and C5-6 with mild central stenosis.  Moderate bilateral C5 and C6 foraminal stenosis is noted.  Patient is not  noticed any numbness in her hand she has had problems with first compartment wrist stenosing tenosynovitis in the past. Review of Systems past history anxiety and depression on no antidepressant medication.  She takes oral medication for diabetes and elevated cholesterol.   Objective: Vital Signs: BP 135/82   Ht 5\' 2"  (1.575 m)   Wt 175 lb (79.4 kg)   BMI 32.01 kg/m   Physical Exam Constitutional:      Appearance: She is well-developed.  HENT:     Head: Normocephalic.     Right Ear: External ear normal.     Left Ear: External ear normal.  Eyes:     Pupils: Pupils are equal, round, and reactive to light.  Neck:     Thyroid: No thyromegaly.     Trachea: No tracheal deviation.  Cardiovascular:     Rate and Rhythm: Normal rate.  Pulmonary:     Effort: Pulmonary effort is normal.  Abdominal:     Palpations: Abdomen is soft.  Skin:    General: Skin is warm and dry.  Neurological:     Mental Status: She is alert and oriented to person, place, and time.  Psychiatric:        Behavior: Behavior normal.     Ortho Exam upper and lower extremity reflexes are 2+ and symmetrical she has tenderness over the trapezius over the coracoid's  over the long head of the biceps over the lateral epicondyles.  Tenderness along the medial border the scapula right and left.  Negative Spurling right and left.  No lower extremity clonus.  She has exquisite tenderness over the left trochanter and she states this is her most painful spot.  Specialty Comments:  No specialty comments available.  Imaging: Narrative & Impression  CLINICAL DATA:  Initial evaluation for chronic and persistent neck pain with numbness involving the left side of body.  EXAM: MRI CERVICAL SPINE WITHOUT CONTRAST  TECHNIQUE: Multiplanar, multisequence MR imaging of the cervical spine was performed. No intravenous contrast was administered.  COMPARISON:  Prior radiograph from 04/13/2019.  FINDINGS: Alignment: Mild  straightening of the normal cervical lordosis. No listhesis.  Vertebrae: Vertebral body height maintained without acute or chronic fracture. Bone marrow signal intensity within normal limits. No discrete or worrisome osseous lesions. No abnormal marrow edema.  Cord: Normal signal and morphology.  Posterior Fossa, vertebral arteries, paraspinal tissues: Visualized brain and posterior fossa within normal limits. Craniocervical junction normal. Paraspinous and prevertebral soft tissues within normal limits. Normal flow voids seen within the vertebral arteries bilaterally.  Disc levels:  C2-C3: Unremarkable.  C3-C4: Left foraminal disc osteophyte complex with associated moderate left facet degeneration. Resultant moderate left C4 foraminal narrowing. Mild flattening of the ventral thecal sac without significant spinal stenosis or cord deformity. Right neural foramen remains widely patent.  C4-C5: Degenerative intervertebral disc space narrowing with diffuse disc osteophyte complex. Broad posterior component flattens and effaces the ventral thecal sac with resultant mild spinal stenosis. No significant cord deformity. Mild to moderate bilateral C5 foraminal stenosis, right slightly worse than left.  C5-C6: Degenerative intervertebral disc space narrowing with diffuse disc bulge and bilateral uncovertebral spurring. Broad based central disc protrusion indents the ventral thecal sac with resultant mild spinal stenosis. No significant cord deformity. Mild to moderate right with mild left C6 foraminal narrowing.  C6-C7: Minimal disc bulge. No spinal stenosis. Foramina remain patent.  C7-T1: Normal interspace. Moderate right with mild left facet hypertrophy. No canal or foraminal stenosis.  Visualized upper thoracic spine demonstrates no significant finding.  IMPRESSION: 1. Left foraminal disc osteophyte complex at C3-4 with resultant moderate left C4 foraminal  stenosis. 2. Degenerative disc osteophyte at C4-5 and C5-6 with resultant mild spinal stenosis, with mild to moderate bilateral C5 and right C6 foraminal narrowing as above.   Electronically Signed   By: Jeannine Boga M.D.   On: 01/09/2020 23:29        PMFS History: Patient Active Problem List   Diagnosis Date Noted  . Hyperlipidemia associated with type 2 diabetes mellitus ([place]) 12/11/2019  . Acute midline thoracic back pain 12/11/2019  . Trochanteric bursitis, left hip 05/29/2019  . Protrusion of lumbar intervertebral disc 05/21/2019  . DDD (degenerative disc disease), lumbar 05/21/2019  . Urinary incontinence 05/18/2019  . Left lumbar radiculitis 04/27/2019  . Radiculitis of left cervical region 04/27/2019  . Facet arthritis, degenerative, lumbar spine 04/16/2019  . Spondylosis without myelopathy or radiculopathy, cervical region 04/16/2019  . Anxiety as acute reaction to exceptional stress 09/07/2018  . Blepharospasm 06/13/2018  . Hypertension associated with diabetes ([place]) 03/07/2018  . Microalbuminuria 09/13/2017  . Vitamin D deficiency 09/12/2017  . Visit for screening mammogram 09/12/2017  . Myofascial pain syndrome 07/14/2017  . Shortness of breath 07/07/2017  . Reactive airway disease 07/03/2017  . Persistent cough for 3 weeks or longer 07/03/2017  . Itching 06/13/2017  . Anxiety 06/13/2017  . Lipoma  of left lower extremity 06/13/2017  . Memory changes 04/24/2017  . Low serum vitamin B12 04/20/2017  . Systolic murmur 72/53/6644  . Light stools 03/03/2016  . Mouth sores 12/15/2015  . Black stools 12/15/2015  . Polyarthralgia 11/19/2015  . De Quervain's disease (radial styloid tenosynovitis) 11/14/2015  . BPV (benign positional vertigo) 11/14/2015  . Migraine headache without aura 10/16/2015  . Abdominal pain, epigastric 11/09/2013  . Obesity, unspecified 10/29/2013  . Hot flashes 10/29/2013  . Seasonal allergies 08/03/2013  . Allergic rhinitis  08/03/2013  . Tinea versicolor 08/03/2013  . Sudoriferous cyst 07/17/2013  . 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 Obesity (BMI 30-39.9) 06/27/2013  . Diabetes mellitus with microalbuminuria ([place]) 06/27/2013  . Abnormal weight gain 06/27/2013  . Hyperlipemia 06/27/2013  . Cyst, eyelid sebaceous 06/27/2013  . Onychomycosis 06/27/2013  . Colon cancer screening 05/25/2013  . Dysplasia of cervix, low grade (CIN 1) 05/24/2013  . Gastroesophageal reflux disease 05/10/2013  . Globus sensation 05/10/2013  . Atypical chest pain 05/10/2013  . Internal hemorrhoids 05/10/2013  . Decreased libido 05/10/2013  . Hyperlipidemia 03/28/2013  . Hiatal hernia 03/28/2013  . Essential hypertension, benign 03/28/2013  . Diabetes mellitus type II, controlled ([place]) 03/28/2013   Past Medical History:  Diagnosis Date  . Diabetes mellitus without complication ([place])   . Hyperlipidemia   . Hypertension     Family History  Problem Relation Age of Onset  . Hyperlipidemia Mother   . Stroke Mother   . Diabetes Father   . Hyperlipidemia Father   . Hypertension Father   . Diabetes Maternal Grandfather   . Hyperlipidemia Maternal Grandfather   . Diabetes Paternal Grandfather   . Hyperlipidemia Paternal Grandfather     Past Surgical History:  Procedure Laterality Date  . AUGMENTATION MAMMAPLASTY    . BREAST ENHANCEMENT SURGERY    . ESOPHAGEAL MANOMETRY N/A 07/02/2013   Procedure: ESOPHAGEAL MANOMETRY (EM);  Surgeon: Jerene Bears, MD;  Location: [facility];  Service: Gastroenterology;  Laterality: N/A;  . TONSILECTOMY/ADENOIDECTOMY WITH MYRINGOTOMY    . TUBAL LIGATION    . tummy tuck     Social History   Occupational History  . Occupation: Retired  Tobacco Use  . Smoking status: Former Smoker    Quit date: 03/23/1991    Years since quitting: 28.8  . Smokeless tobacco: Never Used  Substance and Sexual Activity  . Alcohol use: No  . Drug use: No  . Sexual activity: Yes    Birth control/protection:  Surgical, Post-menopausal

## 2020-01-16 ENCOUNTER — Ambulatory Visit (INDEPENDENT_AMBULATORY_CARE_PROVIDER_SITE_OTHER): Payer: 59 | Admitting: Physician Assistant

## 2020-01-16 ENCOUNTER — Encounter: Payer: Self-pay | Admitting: Physician Assistant

## 2020-01-16 VITALS — BP 134/80 | HR 89 | Ht 62.0 in | Wt 179.0 lb

## 2020-01-16 DIAGNOSIS — E1142 Type 2 diabetes mellitus with diabetic polyneuropathy: Secondary | ICD-10-CM

## 2020-01-16 DIAGNOSIS — G894 Chronic pain syndrome: Secondary | ICD-10-CM

## 2020-01-16 DIAGNOSIS — G8929 Other chronic pain: Secondary | ICD-10-CM | POA: Insufficient documentation

## 2020-01-16 DIAGNOSIS — M2550 Pain in unspecified joint: Secondary | ICD-10-CM

## 2020-01-16 DIAGNOSIS — F419 Anxiety disorder, unspecified: Secondary | ICD-10-CM | POA: Diagnosis not present

## 2020-01-16 DIAGNOSIS — I10 Essential (primary) hypertension: Secondary | ICD-10-CM

## 2020-01-16 DIAGNOSIS — F331 Major depressive disorder, recurrent, moderate: Secondary | ICD-10-CM

## 2020-01-16 MED ORDER — SAXAGLIPTIN HCL 5 MG PO TABS: 5.0000 mg | ORAL_TABLET | Freq: Every day | ORAL | 1 refills | Status: DC

## 2020-01-16 MED ORDER — TRAMADOL HCL 50 MG PO TABS: 50.0000 mg | ORAL_TABLET | Freq: Two times a day (BID) | ORAL | 0 refills | Status: DC | PRN

## 2020-01-16 MED ORDER — LIDOCAINE HCL 1 % IJ SOLN
0.5000 mL | INTRAMUSCULAR | Status: AC | PRN
  Administered 2020-01-15: .5 mL

## 2020-01-16 MED ORDER — METHYLPREDNISOLONE ACETATE 40 MG/ML IJ SUSP
40.0000 mg | INTRAMUSCULAR | Status: AC | PRN
  Administered 2020-01-15: 40 mg via INTRA_ARTICULAR

## 2020-01-16 MED ORDER — DULOXETINE HCL 60 MG PO CPEP: 60.0000 mg | ORAL_CAPSULE | Freq: Every day | ORAL | 2 refills | Status: DC

## 2020-01-16 MED ORDER — BUPIVACAINE HCL 0.25 % IJ SOLN
2.0000 mL | INTRAMUSCULAR | Status: AC | PRN
  Administered 2020-01-15: 2 mL via INTRA_ARTICULAR

## 2020-01-16 MED ORDER — BUSPIRONE HCL 5 MG PO TABS: 5.0000 mg | ORAL_TABLET | Freq: Three times a day (TID) | ORAL | 0 refills | Status: DC

## 2020-01-16 NOTE — Progress Notes (Signed)
Subjective:    Patient ID: Diane Brooks, female    DOB: 1955/08/28, 64 y.o.   MRN: [id number]  HPI  Patient is a 64 year old obese female with type 2 diabetes, hypertension, chronic pain due to lumbar degenerative disc disease and trochanteric bursitis and cervical spondylosis, anxiety who presents to the clinic for follow-up.  She just saw Ortho on 01/15/2020. She had another injection in greater trochanter. She was set up with PT. He wanted her to follow up with more medications for depression and to take over tramadol refills. She is also on gabapentin. She is taking 67m of cymbalta. He anxiety is better on buspar.   She picks up her home sleep apnea testing kit this week.    .. Active Ambulatory Problems    Diagnosis Date Noted  . Hyperlipidemia 03/28/2013  . Hiatal hernia 03/28/2013  . Essential hypertension, benign 03/28/2013  . Diabetes mellitus type II, controlled ([place] 03/28/2013  . Gastroesophageal reflux disease 05/10/2013  . Globus sensation 05/10/2013  . Atypical chest pain 05/10/2013  . Internal hemorrhoids 05/10/2013  . Decreased libido 05/10/2013  . Dysplasia of cervix, low grade (CIN 1) 05/24/2013  . Colon cancer screening 05/25/2013  . Obesity (BMI 30-39.9) 06/27/2013  . Diabetes mellitus with microalbuminuria ([place] 06/27/2013  . Abnormal weight gain 06/27/2013  . Hyperlipemia 06/27/2013  . Cyst, eyelid sebaceous 06/27/2013  . Onychomycosis 06/27/2013  . 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 Sudoriferous cyst 07/17/2013  . Seasonal allergies 08/03/2013  . Allergic rhinitis 08/03/2013  . Tinea versicolor 08/03/2013  . Obesity, unspecified 10/29/2013  . Hot flashes 10/29/2013  . Abdominal pain, epigastric 11/09/2013  . Migraine headache without aura 10/16/2015  . De Quervain's disease (radial styloid tenosynovitis) 11/14/2015  . BPV (benign positional vertigo) 11/14/2015  . Polyarthralgia 11/19/2015  . Mouth sores 12/15/2015  . Black stools 12/15/2015  .  Light stools 03/03/2016  . Systolic murmur 032/02/2481 . Low serum vitamin B12 04/20/2017  . Memory changes 04/24/2017  . Itching 06/13/2017  . Anxiety 06/13/2017  . Lipoma of left lower extremity 06/13/2017  . Reactive airway disease 07/03/2017  . Persistent cough for 3 weeks or longer 07/03/2017  . Shortness of breath 07/07/2017  . Myofascial pain syndrome 07/14/2017  . Vitamin D deficiency 09/12/2017  . Visit for screening mammogram 09/12/2017  . Microalbuminuria 09/13/2017  . Hypertension associated with diabetes ([place] 03/07/2018  . Blepharospasm 06/13/2018  . Anxiety as acute reaction to exceptional stress 09/07/2018  . Facet arthritis, degenerative, lumbar spine 04/16/2019  . Spondylosis without myelopathy or radiculopathy, cervical region 04/16/2019  . Left lumbar radiculitis 04/27/2019  . Radiculitis of left cervical region 04/27/2019  . Urinary incontinence 05/18/2019  . Protrusion of lumbar intervertebral disc 05/21/2019  . DDD (degenerative disc disease), lumbar 05/21/2019  . Trochanteric bursitis, left hip 05/29/2019  . Hyperlipidemia associated with type 2 diabetes mellitus ([place] 12/11/2019  . Acute midline thoracic back pain 12/11/2019  . Chronic pain 01/16/2020   Resolved Ambulatory Problems    Diagnosis Date Noted  . Mid back pain 10/24/2015  . Left-sided low back pain with left-sided sciatica 10/24/2015  . Neck pain 10/24/2015  . Muscle spasm of back 11/14/2015  . Chronic left-sided low back pain without sciatica 03/09/2018  . DDD (degenerative disc disease), cervical 04/16/2019   Past Medical History:  Diagnosis Date  . Diabetes mellitus without complication ([place]   . Hypertension      Review of Systems  See HPI.      Objective:  Physical Exam Vitals reviewed.  Constitutional:      Appearance: Normal appearance. She is obese.  HENT:     Head: Normocephalic.  Cardiovascular:     Rate and Rhythm: Normal rate and regular rhythm.     Pulses:  Normal pulses.  Pulmonary:     Effort: Pulmonary effort is normal.  Musculoskeletal:     Right lower leg: No edema.     Left lower leg: No edema.  Neurological:     General: No focal deficit present.     Mental Status: She is alert.  Psychiatric:        Mood and Affect: Mood normal.     .. Depression screen [facility] 2/9 10/26/2019 09/06/2018 07/14/2017 04/19/2017 03/30/2013  Decreased Interest 1 0 3 0 0  Down, Depressed, Hopeless '3 2 1 2 ' 0  PHQ - 2 Score '4 2 4 2 ' 0  Altered sleeping '2 1 3 ' 0 -  Tired, decreased energy 2 0 3 1 -  Change in appetite 1 1 0 3 -  Feeling bad or failure about yourself  3 1 0 2 -  Trouble concentrating 2 0 0 2 -  Moving slowly or fidgety/restless '2 1 3 2 ' -  Suicidal thoughts 0 0 0 0 -  PHQ-9 Score '16 6 13 12 ' -  Difficult doing work/chores - Not difficult at all Very difficult - -   .[name] Kitchen GAD 7 : Generalized Anxiety Score 10/26/2019 09/06/2018 07/14/2017  Nervous, Anxious, on Edge 3 2 0  Control/stop worrying '3 1 3  ' Worry too much - different things 3 1 0  Trouble relaxing '3 2 3  ' Restless 1 0 0  Easily annoyed or irritable '2 1 3  ' Afraid - awful might happen '3 1 1  ' Total GAD 7 Score '18 8 10  ' Anxiety Difficulty - Not difficult at all Very difficult          Assessment & Plan:  [name] KitchenMarland KitchenHarold was seen today for hypertension.  Diagnoses and all orders for this visit:  Essential hypertension, benign  Anxiety -     DULoxetine (CYMBALTA) 60 MG capsule; Take 1 capsule (60 mg total) by mouth daily. -     busPIRone (BUSPAR) 5 MG tablet; Take 1 tablet (5 mg total) by mouth 3 (three) times daily.  Chronic pain syndrome -     traMADol (ULTRAM) 50 MG tablet; Take 1 tablet (50 mg total) by mouth every 12 (twelve) hours as needed. -     DULoxetine (CYMBALTA) 60 MG capsule; Take 1 capsule (60 mg total) by mouth daily.  Controlled type 2 diabetes mellitus with diabetic polyneuropathy, without long-term current use of insulin (HCC) -     saxagliptin HCl (ONGLYZA) 5 MG TABS  tablet; Take 1 tablet (5 mg total) by mouth daily.  Polyarthralgia -     DULoxetine (CYMBALTA) 60 MG capsule; Take 1 capsule (60 mg total) by mouth daily.   Too soon for A1C. She does bring in logged sugars and glucose increasing a little. No changes made today. Discussed DM diet. Continue increased dose of onglyza, follow up a1c in 1 month.   BP to goal. Continue on same medications.   Pain contract signed today.  opiod score moderate risk. [name] KitchenMarland KitchenPDMP reviewed during this encounter.  Refilled tramadol and increased to twice a day.  Continue cymbalta for pain but increased to 49m.  Anxiety stay on buspar.   Follow up in 1 month.

## 2020-01-16 NOTE — Patient Instructions (Signed)
Increased cymbalta to 60mg  Stay on buspar. Tramadol added twice a day.

## 2020-01-17 ENCOUNTER — Telehealth: Payer: Self-pay | Admitting: *Deleted

## 2020-01-17 NOTE — Telephone Encounter (Signed)
Left patient a very detailed message to explain her medical records and why Dr. Gala Romney has not seen her Pap/Cytology reports from pervious office. [facility] [facility] has not sent the specific records that were requested on 01/07/2020 due to the patient requesting all of her records be sent to [facility] on 01/08/2020. I sent an urgent request on 01/17/2020 to obtain the specific records that are needed.

## 2020-01-18 ENCOUNTER — Telehealth: Payer: Self-pay | Admitting: *Deleted

## 2020-01-18 NOTE — Telephone Encounter (Signed)
Prior auth started for Tramadol.  Awaiting decision from ins.

## 2020-01-18 NOTE — Telephone Encounter (Signed)
Tramadol approved. Pharmacy notified.

## 2020-01-21 ENCOUNTER — Telehealth: Payer: Self-pay | Admitting: *Deleted

## 2020-01-21 NOTE — Telephone Encounter (Signed)
Error

## 2020-01-22 ENCOUNTER — Encounter: Payer: Self-pay | Admitting: *Deleted

## 2020-01-22 DIAGNOSIS — F331 Major depressive disorder, recurrent, moderate: Secondary | ICD-10-CM | POA: Insufficient documentation

## 2020-01-28 ENCOUNTER — Ambulatory Visit: Payer: 59 | Admitting: Physician Assistant

## 2020-02-04 ENCOUNTER — Encounter: Payer: Self-pay | Admitting: Obstetrics & Gynecology

## 2020-02-04 ENCOUNTER — Ambulatory Visit (INDEPENDENT_AMBULATORY_CARE_PROVIDER_SITE_OTHER): Payer: 59 | Admitting: Obstetrics & Gynecology

## 2020-02-04 ENCOUNTER — Other Ambulatory Visit: Payer: Self-pay

## 2020-02-04 VITALS — BP 132/75 | HR 73 | Ht 62.0 in | Wt 175.0 lb

## 2020-02-04 DIAGNOSIS — N870 Mild cervical dysplasia: Secondary | ICD-10-CM

## 2020-02-04 DIAGNOSIS — B977 Papillomavirus as the cause of diseases classified elsewhere: Secondary | ICD-10-CM

## 2020-02-04 MED ORDER — VITAMIN C 250 MG PO TABS: 250.0000 mg | ORAL_TABLET | Freq: Every day | ORAL | 12 refills | Status: AC

## 2020-02-04 NOTE — Progress Notes (Signed)
   Subjective:    Patient ID: Diane Brooks, female    DOB: 18-Mar-1956, 64 y.o.   MRN: [id number]  HPI Diane Brooks presents to discuss her colposcopy results and plan.  Patient had a Pap smear that showed ASCUS and HPV positive from [facility] in 2020.  When she went to the operating room for evaluation of postmenopausal bleeding, the colposcopy was performed to the operating room.  It does not appear that any biopsies were taken.  On her follow-up Pap smear here at [facility] her Pap smear was abnormal again and colposcopy showed CIN-1 of the cervix.  Following ASC CP guidelines, the next step would be cotesting in 1 year.   Review of Systems  Constitutional: Negative.   Respiratory: Negative.   Cardiovascular: Negative.   Gastrointestinal: Negative.   Genitourinary: Negative.   Neurological: Positive for headaches.       Objective:   Physical Exam Vitals reviewed.  Constitutional:      General: She is not in acute distress.    Appearance: She is well-developed.  HENT:     Head: Normocephalic and atraumatic.  Eyes:     Conjunctiva/sclera: Conjunctivae normal.  Cardiovascular:     Rate and Rhythm: Normal rate.  Pulmonary:     Effort: Pulmonary effort is normal.  Skin:    General: Skin is warm and dry.  Neurological:     Mental Status: She is alert and oriented to person, place, and time.    Vitals:   02/04/20 1342  BP: 132/75  Pulse: 73  Weight: 175 lb (79.4 kg)  Height: 5\' 2"  (1.575 m)      Assessment & Plan:  64 year old female with CIN-1 on colposcopy.  Reviewed all Pap smears and HPV test since 2012.  Patient concurs with plan of waiting 1 year.  She will return to my office for Pap smear and HPV testing in October 2022.  32 minutes were spent reviewing records both at [place] and [facility], counseling and exam today and documentation.

## 2020-02-06 ENCOUNTER — Encounter: Payer: Self-pay | Admitting: Physician Assistant

## 2020-02-06 ENCOUNTER — Ambulatory Visit (INDEPENDENT_AMBULATORY_CARE_PROVIDER_SITE_OTHER): Payer: 59 | Admitting: Physician Assistant

## 2020-02-06 VITALS — BP 121/70 | HR 82 | Ht 62.0 in | Wt 179.0 lb

## 2020-02-06 DIAGNOSIS — E1142 Type 2 diabetes mellitus with diabetic polyneuropathy: Secondary | ICD-10-CM | POA: Diagnosis not present

## 2020-02-06 DIAGNOSIS — E538 Deficiency of other specified B group vitamins: Secondary | ICD-10-CM

## 2020-02-06 LAB — POCT GLYCOSYLATED HEMOGLOBIN (HGB A1C): Hemoglobin A1C: 6.6 % — AB (ref 4.0–5.6)

## 2020-02-06 MED ORDER — TRULICITY 0.75 MG/0.5ML ~~LOC~~ SOAJ: 0.7500 mg | SUBCUTANEOUS | 2 refills | Status: DC

## 2020-02-06 MED ORDER — CYANOCOBALAMIN 1000 MCG/ML IJ SOLN
1000.0000 ug | Freq: Once | INTRAMUSCULAR | Status: AC
  Administered 2020-02-06: 1000 ug via INTRAMUSCULAR

## 2020-02-06 NOTE — Progress Notes (Signed)
Subjective:    Patient ID: Diane Brooks, female    DOB: [birth year], 64 y.o.   MRN: [id number]  HPI  Patient is a 64 year old female with hypertension, migraines, type 2 diabetes, GERD, facet arthritis, lumbar degenerative disc disease, left hip bursitis who presents to the clinic for follow-up and medication refills.  Migraines are doing well on Aimovig.  Her mood is doing very well.  She went to visit her son in [place] and stopped all of her mood medications. She has been off for 4 weeks.  She feels like she might can do this on her own.  Overall her pain is fairly well controlled as well.  She still does have tramadol that she will occasionally use.  She stopped Cymbalta and BuSpar.  .. Active Ambulatory Problems    Diagnosis Date Noted  . Hyperlipidemia 03/28/2013  . Hiatal hernia 03/28/2013  . Essential hypertension, benign 03/28/2013  . Diabetes mellitus type II, controlled ([place]) 03/28/2013  . Gastroesophageal reflux disease 05/10/2013  . Globus sensation 05/10/2013  . Atypical chest pain 05/10/2013  . Internal hemorrhoids 05/10/2013  . Decreased libido 05/10/2013  . Dysplasia of cervix, low grade (CIN 1) 05/24/2013  . Colon cancer screening 05/25/2013  . Obesity (BMI 30-39.9) 06/27/2013  . Diabetes mellitus with microalbuminuria ([place]) 06/27/2013  . Abnormal weight gain 06/27/2013  . Hyperlipemia 06/27/2013  . Cyst, eyelid sebaceous 06/27/2013  . Onychomycosis 06/27/2013  . 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 Sudoriferous cyst 07/17/2013  . Seasonal allergies 08/03/2013  . Allergic rhinitis 08/03/2013  . Tinea versicolor 08/03/2013  . Obesity, unspecified 10/29/2013  . Hot flashes 10/29/2013  . Abdominal pain, epigastric 11/09/2013  . Migraine headache without aura 10/16/2015  . De Quervain's disease (radial styloid tenosynovitis) 11/14/2015  . BPV (benign positional vertigo) 11/14/2015  . Polyarthralgia 11/19/2015  . Mouth sores 12/15/2015  . Black stools  12/15/2015  . Light stools 03/03/2016  . Systolic murmur 40/98/1191  . Low serum vitamin B12 04/20/2017  . Memory changes 04/24/2017  . Itching 06/13/2017  . Anxiety 06/13/2017  . Lipoma of left lower extremity 06/13/2017  . Reactive airway disease 07/03/2017  . Persistent cough for 3 weeks or longer 07/03/2017  . Shortness of breath 07/07/2017  . Myofascial pain syndrome 07/14/2017  . Vitamin D deficiency 09/12/2017  . Visit for screening mammogram 09/12/2017  . Microalbuminuria 09/13/2017  . Hypertension associated with diabetes ([place]) 03/07/2018  . Blepharospasm 06/13/2018  . Anxiety as acute reaction to exceptional stress 09/07/2018  . Facet arthritis, degenerative, lumbar spine 04/16/2019  . Spondylosis without myelopathy or radiculopathy, cervical region 04/16/2019  . Left lumbar radiculitis 04/27/2019  . Radiculitis of left cervical region 04/27/2019  . Urinary incontinence 05/18/2019  . Protrusion of lumbar intervertebral disc 05/21/2019  . DDD (degenerative disc disease), lumbar 05/21/2019  . Trochanteric bursitis, left hip 05/29/2019  . Hyperlipidemia associated with type 2 diabetes mellitus ([place]) 12/11/2019  . Acute midline thoracic back pain 12/11/2019  . Chronic pain 01/16/2020  . Moderate episode of recurrent major depressive disorder ([place]) 01/22/2020   Resolved Ambulatory Problems    Diagnosis Date Noted  . Mid back pain 10/24/2015  . Left-sided low back pain with left-sided sciatica 10/24/2015  . Neck pain 10/24/2015  . Muscle spasm of back 11/14/2015  . Chronic left-sided low back pain without sciatica 03/09/2018  . DDD (degenerative disc disease), cervical 04/16/2019   Past Medical History:  Diagnosis Date  . Diabetes mellitus without complication ([place])   . Hypertension  Review of Systems  All other systems reviewed and are negative.      Objective:   Physical Exam Vitals reviewed.  Constitutional:      Appearance: Normal appearance. She is  obese.  HENT:     Head: Normocephalic.  Cardiovascular:     Rate and Rhythm: Normal rate and regular rhythm.     Pulses: Normal pulses.     Heart sounds: Normal heart sounds.  Pulmonary:     Effort: Pulmonary effort is normal.     Breath sounds: Normal breath sounds.  Neurological:     General: No focal deficit present.     Mental Status: She is alert and oriented to person, place, and time.  Psychiatric:        Mood and Affect: Mood normal.    .. Depression screen [facility] 2/9 02/06/2020 10/26/2019 09/06/2018 07/14/2017 04/19/2017  Decreased Interest 1 1 0 3 0  Down, Depressed, Hopeless 1 3 2 1 2   PHQ - 2 Score 2 4 2 4 2   Altered sleeping 1 2 1 3  0  Tired, decreased energy 0 2 0 3 1  Change in appetite 1 1 1  0 3  Feeling bad or failure about yourself  0 3 1 0 2  Trouble concentrating 0 2 0 0 2  Moving slowly or fidgety/restless 0 2 1 3 2   Suicidal thoughts 0 0 0 0 0  PHQ-9 Score 4 16 6 13 12   Difficult doing work/chores Not difficult at all - Not difficult at all Very difficult -   .. GAD 7 : Generalized Anxiety Score 02/06/2020 10/26/2019 09/06/2018 07/14/2017  Nervous, Anxious, on Edge 1 3 2  0  Control/stop worrying 1 3 1 3   Worry too much - different things 1 3 1  0  Trouble relaxing 1 3 2 3   Restless 1 1 0 0  Easily annoyed or irritable 0 2 1 3   Afraid - awful might happen 1 3 1 1   Total GAD 7 Score 6 18 8 10   Anxiety Difficulty - - Not difficult at all Very difficult           Assessment & Plan:  [name] KitchenMarland KitchenAleena was seen today for diabetes.  Diagnoses and all orders for this visit:  Controlled type 2 diabetes mellitus with diabetic polyneuropathy, without long-term current use of insulin (HCC) -     POCT glycosylated hemoglobin (Hb A1C) -     Dulaglutide (TRULICITY) 0.27 XA/1.2IN SOPN; Inject 0.75 mg into the skin once a week.  Low serum vitamin B12 -     cyanocobalamin ((VITAMIN B-12)) injection 1,000 mcg   .[name] Kitchen Results for orders placed or performed in visit on  02/06/20  POCT glycosylated hemoglobin (Hb A1C)  Result Value Ref Range   Hemoglobin A1C 6.6 (A) 4.0 - 5.6 %   HbA1c POC (<> result, manual entry)     HbA1c, POC (prediabetic range)     HbA1c, POC (controlled diabetic range)     A1C to goal but rising since metformin stopped due to GI issues.  Added trulicity. Discussed how to use in office. Discussed side effects.  On statin.  On aRB BP to goal.  Flu/covid/pneumonia shot UTD.  UTD eye and foot exam.  Follow up in 3 months.   Concerned about patient stopping all of her mood and pain medications.  Spent some time discussing importance on staying on medication.  If she would like to decrease in some capacity we can do that slowly as to not  somebody in shock once medication has been withdrawn.  Explained again how her mood and her pain are related.  If her mood starts to de-escalate her pain could increase. She agrees to start back cymbalt 30mg  only.

## 2020-02-06 NOTE — Patient Instructions (Signed)
Start trulicity.

## 2020-03-03 ENCOUNTER — Other Ambulatory Visit: Payer: Self-pay | Admitting: Physician Assistant

## 2020-03-03 DIAGNOSIS — K224 Dyskinesia of esophagus: Secondary | ICD-10-CM

## 2020-03-03 DIAGNOSIS — K219 Gastro-esophageal reflux disease without esophagitis: Secondary | ICD-10-CM

## 2020-03-11 ENCOUNTER — Other Ambulatory Visit: Payer: Self-pay

## 2020-03-11 ENCOUNTER — Encounter: Payer: Self-pay | Admitting: Physician Assistant

## 2020-03-11 ENCOUNTER — Ambulatory Visit (INDEPENDENT_AMBULATORY_CARE_PROVIDER_SITE_OTHER): Payer: 59 | Admitting: Physician Assistant

## 2020-03-11 VITALS — BP 143/65 | HR 85 | Ht 62.0 in | Wt 175.0 lb

## 2020-03-11 DIAGNOSIS — E538 Deficiency of other specified B group vitamins: Secondary | ICD-10-CM | POA: Diagnosis not present

## 2020-03-11 DIAGNOSIS — R1013 Epigastric pain: Secondary | ICD-10-CM | POA: Diagnosis not present

## 2020-03-11 DIAGNOSIS — K2900 Acute gastritis without bleeding: Secondary | ICD-10-CM | POA: Diagnosis not present

## 2020-03-11 DIAGNOSIS — E118 Type 2 diabetes mellitus with unspecified complications: Secondary | ICD-10-CM

## 2020-03-11 DIAGNOSIS — T887XXA Unspecified adverse effect of drug or medicament, initial encounter: Secondary | ICD-10-CM

## 2020-03-11 MED ORDER — ONDANSETRON 8 MG PO TBDP: 8.0000 mg | ORAL_TABLET | Freq: Three times a day (TID) | ORAL | 1 refills | Status: DC | PRN

## 2020-03-11 MED ORDER — LIDOCAINE VISCOUS HCL 2 % MT SOLN
15.0000 mL | Freq: Once | OROMUCOSAL | Status: AC
  Administered 2020-03-11: 15 mL via OROMUCOSAL

## 2020-03-11 MED ORDER — CYANOCOBALAMIN 1000 MCG/ML IJ SOLN
1000.0000 ug | Freq: Once | INTRAMUSCULAR | Status: AC
  Administered 2020-03-11: 1000 ug via INTRAMUSCULAR

## 2020-03-11 MED ORDER — SUCRALFATE 1 G PO TABS: 1.0000 g | ORAL_TABLET | Freq: Three times a day (TID) | ORAL | 0 refills | Status: DC

## 2020-03-11 MED ORDER — ALUM & MAG HYDROXIDE-SIMETH 200-200-20 MG/5ML PO SUSP
30.0000 mL | Freq: Once | ORAL | Status: AC
Start: 2020-03-11 — End: 2020-03-11
  Administered 2020-03-11: 30 mL via ORAL

## 2020-03-11 MED ORDER — SITAGLIPTIN PHOSPHATE 100 MG PO TABS: 100.0000 mg | ORAL_TABLET | Freq: Every day | ORAL | 2 refills | Status: DC

## 2020-03-11 MED ORDER — HYOSCYAMINE SULFATE 0.125 MG PO TBDP
0.1250 mg | ORAL_TABLET | Freq: Once | ORAL | Status: AC
Start: 2020-03-11 — End: 2020-03-11
  Administered 2020-03-11: 0.125 mg via SUBLINGUAL

## 2020-03-11 NOTE — Patient Instructions (Addendum)
Start [place] on Monday. Recheck A1C in 3 months. STOP Trulicity.  zofran as needed for nausea.  carafate before eating and bedtime for 2 weeks.

## 2020-03-11 NOTE — Progress Notes (Signed)
Subjective:    Patient ID: Diane Brooks, female    DOB: Jan 29, 1956, 64 y.o.   MRN: [id number]  HPI  Pt is a 64 yo female with T2DM who presents to the clinic to discuss bloating, gas, epigastric pain, right sided chest pain, nausea since staring trulicity. She has no appetite. She has tried to tolerate for the last 6 weeks. Denies any fever, chills, diarrhea or constipation. No vomiting, melena or hematochezia. She is only on trulicity right now for diabetes.  .. Active Ambulatory Problems    Diagnosis Date Noted  . Hyperlipidemia 03/28/2013  . Hiatal hernia 03/28/2013  . Essential hypertension, benign 03/28/2013  . Diabetes mellitus type II, controlled ([place]) 03/28/2013  . Gastroesophageal reflux disease 05/10/2013  . Globus sensation 05/10/2013  . Atypical chest pain 05/10/2013  . Internal hemorrhoids 05/10/2013  . Decreased libido 05/10/2013  . Dysplasia of cervix, low grade (CIN 1) 05/24/2013  . Colon cancer screening 05/25/2013  . Obesity (BMI 30-39.9) 06/27/2013  . Diabetes mellitus with microalbuminuria ([place]) 06/27/2013  . Abnormal weight gain 06/27/2013  . Hyperlipemia 06/27/2013  . Cyst, eyelid sebaceous 06/27/2013  . Onychomycosis 06/27/2013  . 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 Sudoriferous cyst 07/17/2013  . Seasonal allergies 08/03/2013  . Allergic rhinitis 08/03/2013  . Tinea versicolor 08/03/2013  . Obesity, unspecified 10/29/2013  . Hot flashes 10/29/2013  . Abdominal pain, epigastric 11/09/2013  . Migraine headache without aura 10/16/2015  . De Quervain's disease (radial styloid tenosynovitis) 11/14/2015  . BPV (benign positional vertigo) 11/14/2015  . Polyarthralgia 11/19/2015  . Mouth sores 12/15/2015  . Black stools 12/15/2015  . Light stools 03/03/2016  . Systolic murmur 79/39/0300  . Low serum vitamin B12 04/20/2017  . Memory changes 04/24/2017  . Itching 06/13/2017  . Anxiety 06/13/2017  . Lipoma of left lower extremity 06/13/2017  . Reactive  airway disease 07/03/2017  . Persistent cough for 3 weeks or longer 07/03/2017  . Shortness of breath 07/07/2017  . Myofascial pain syndrome 07/14/2017  . Vitamin D deficiency 09/12/2017  . Visit for screening mammogram 09/12/2017  . Microalbuminuria 09/13/2017  . Hypertension associated with diabetes ([place]) 03/07/2018  . Blepharospasm 06/13/2018  . Anxiety as acute reaction to exceptional stress 09/07/2018  . Facet arthritis, degenerative, lumbar spine 04/16/2019  . Spondylosis without myelopathy or radiculopathy, cervical region 04/16/2019  . Left lumbar radiculitis 04/27/2019  . Radiculitis of left cervical region 04/27/2019  . Urinary incontinence 05/18/2019  . Protrusion of lumbar intervertebral disc 05/21/2019  . DDD (degenerative disc disease), lumbar 05/21/2019  . Trochanteric bursitis, left hip 05/29/2019  . Hyperlipidemia associated with type 2 diabetes mellitus ([place]) 12/11/2019  . Acute midline thoracic back pain 12/11/2019  . Chronic pain 01/16/2020  . Moderate episode of recurrent major depressive disorder ([place]) 01/22/2020   Resolved Ambulatory Problems    Diagnosis Date Noted  . Mid back pain 10/24/2015  . Left-sided low back pain with left-sided sciatica 10/24/2015  . Neck pain 10/24/2015  . Muscle spasm of back 11/14/2015  . Chronic left-sided low back pain without sciatica 03/09/2018  . DDD (degenerative disc disease), cervical 04/16/2019   Past Medical History:  Diagnosis Date  . Diabetes mellitus without complication ([place])   . Hypertension        Review of Systems See HPI.     Objective:   Physical Exam Vitals reviewed.  Constitutional:      Appearance: She is well-developed.  HENT:     Head: Normocephalic.  Abdominal:  General: Bowel sounds are normal. There is no distension.     Palpations: Abdomen is soft. There is no mass.     Tenderness: There is abdominal tenderness in the epigastric area. There is no right CVA tenderness, left CVA  tenderness or guarding. Negative signs include Murphy's sign and McBurney's sign.  Neurological:     General: No focal deficit present.     Mental Status: She is alert.           Assessment & Plan:  [name] KitchenMarland KitchenEmnet was seen today for abdominal pain.  Diagnoses and all orders for this visit:  Acute gastritis without hemorrhage, unspecified gastritis type -     ondansetron (ZOFRAN-ODT) 8 MG disintegrating tablet; Take 1 tablet (8 mg total) by mouth every 8 (eight) hours as needed for nausea. -     sucralfate (CARAFATE) 1 g tablet; Take 1 tablet (1 g total) by mouth 4 (four) times daily -  with meals and at bedtime. For 2 weeks. -     alum & mag hydroxide-simeth (MAALOX/MYLANTA) 200-200-20 MG/5ML suspension 30 mL -     hyoscyamine (ANASPAZ) disintergrating tablet 0.125 mg -     lidocaine (XYLOCAINE) 2 % viscous mouth solution 15 mL  Low serum vitamin B12 -     cyanocobalamin ((VITAMIN B-12)) injection 1,000 mcg  Epigastric pain -     ondansetron (ZOFRAN-ODT) 8 MG disintegrating tablet; Take 1 tablet (8 mg total) by mouth every 8 (eight) hours as needed for nausea. -     sucralfate (CARAFATE) 1 g tablet; Take 1 tablet (1 g total) by mouth 4 (four) times daily -  with meals and at bedtime. For 2 weeks. -     alum & mag hydroxide-simeth (MAALOX/MYLANTA) 200-200-20 MG/5ML suspension 30 mL -     hyoscyamine (ANASPAZ) disintergrating tablet 0.125 mg -     lidocaine (XYLOCAINE) 2 % viscous mouth solution 15 mL  Medication side effect -     ondansetron (ZOFRAN-ODT) 8 MG disintegrating tablet; Take 1 tablet (8 mg total) by mouth every 8 (eight) hours as needed for nausea. -     sucralfate (CARAFATE) 1 g tablet; Take 1 tablet (1 g total) by mouth 4 (four) times daily -  with meals and at bedtime. For 2 weeks. -     alum & mag hydroxide-simeth (MAALOX/MYLANTA) 200-200-20 MG/5ML suspension 30 mL -     hyoscyamine (ANASPAZ) disintergrating tablet 0.125 mg -     lidocaine (XYLOCAINE) 2 % viscous mouth  solution 15 mL  Controlled type 2 diabetes mellitus with complication, without long-term current use of insulin (HCC) -     sitaGLIPtin (JANUVIA) 100 MG tablet; Take 1 tablet (100 mg total) by mouth daily.   Pt having a medication side effect to trulicity. Stop trulicity. GI cocktail given in office. Ok to use zofran as needed for the next week or so. Start carafate before meals and bedtime for 2 weeks. Continue PPI.   Start [place]. onylza too expensive. Cannot tolerate metformin and trulicity. Recheck A1C in 3 months.

## 2020-03-12 ENCOUNTER — Ambulatory Visit: Payer: 59

## 2020-03-25 ENCOUNTER — Telehealth (INDEPENDENT_AMBULATORY_CARE_PROVIDER_SITE_OTHER): Payer: 59 | Admitting: Physician Assistant

## 2020-03-25 DIAGNOSIS — F331 Major depressive disorder, recurrent, moderate: Secondary | ICD-10-CM

## 2020-03-25 DIAGNOSIS — M7918 Myalgia, other site: Secondary | ICD-10-CM

## 2020-03-25 DIAGNOSIS — E1142 Type 2 diabetes mellitus with diabetic polyneuropathy: Secondary | ICD-10-CM

## 2020-03-25 DIAGNOSIS — G629 Polyneuropathy, unspecified: Secondary | ICD-10-CM | POA: Diagnosis not present

## 2020-03-25 MED ORDER — DULOXETINE HCL 30 MG PO CPEP: 30.0000 mg | ORAL_CAPSULE | Freq: Every day | ORAL | 1 refills | Status: DC

## 2020-03-25 MED ORDER — GABAPENTIN 300 MG PO CAPS
ORAL_CAPSULE | ORAL | 0 refills | Status: DC
Start: 2020-03-25 — End: 2020-05-21

## 2020-03-25 NOTE — Patient Instructions (Signed)
Myofascial Pain Syndrome and Fibromyalgia Myofascial pain syndrome and fibromyalgia are both pain disorders. This pain may be felt mainly in your muscles.  Myofascial pain syndrome: ? Always has tender points in the muscle that will cause pain when pressed (trigger points). The pain may come and go. ? Usually affects your neck, upper back, and shoulder areas. The pain often radiates into your arms and hands.  Fibromyalgia: ? Has muscle pains and tenderness that come and go. ? Is often associated with fatigue and sleep problems. ? Has trigger points. ? Tends to be long-lasting (chronic), but is not life-threatening. Fibromyalgia and myofascial pain syndrome are not the same. However, they often occur together. If you have both conditions, each can make the other worse. Both are common and can cause enough pain and fatigue to make day-to-day activities difficult. Both can be hard to diagnose because their symptoms are common in many other conditions. What are the causes? The exact causes of these conditions are not known. What increases the risk? You are more likely to develop this condition if:  You have a family history of the condition.  You have certain triggers, such as: ? Spine disorders. ? An injury (trauma) or other physical stressors. ? Being under a lot of stress. ? Medical conditions such as osteoarthritis, rheumatoid arthritis, or lupus. What are the signs or symptoms? Fibromyalgia The main symptom of fibromyalgia is widespread pain and tenderness in your muscles. Pain is sometimes described as stabbing, shooting, or burning. You may also have:  Tingling or numbness.  Sleep problems and fatigue.  Problems with attention and concentration (fibro fog). Other symptoms may include:  Bowel and bladder problems.  Headaches.  Visual problems.  Problems with odors and noises.  Depression or mood changes.  Painful menstrual periods (dysmenorrhea).  Dry skin or  eyes. These symptoms can vary over time. Myofascial pain syndrome Symptoms of myofascial pain syndrome include:  Tight, ropy bands of muscle.  Uncomfortable sensations in muscle areas. These may include aching, cramping, burning, numbness, tingling, and weakness.  Difficulty moving certain parts of the body freely (poor range of motion). How is this diagnosed? This condition may be diagnosed by your symptoms and medical history. You will also have a physical exam. In general:  Fibromyalgia is diagnosed if you have pain, fatigue, and other symptoms for more than 3 months, and symptoms cannot be explained by another condition.  Myofascial pain syndrome is diagnosed if you have trigger points in your muscles, and those trigger points are tender and cause pain elsewhere in your body (referred pain). How is this treated? Treatment for these conditions depends on the type that you have.  For fibromyalgia: ? Pain medicines, such as NSAIDs. ? Medicines for treating depression. ? Medicines for treating seizures. ? Medicines that relax the muscles.  For myofascial pain: ? Pain medicines, such as NSAIDs. ? Cooling and stretching of muscles. ? Trigger point injections. ? Sound wave (ultrasound) treatments to stimulate muscles. Treating these conditions often requires a team of health care providers. These may include:  Your primary care provider.  Physical therapist.  Complementary health care providers, such as massage therapists or acupuncturists.  Psychiatrist for cognitive behavioral therapy. Follow these instructions at home: Medicines  Take over-the-counter and prescription medicines only as told by your health care provider.  Do not drive or use heavy machinery while taking prescription pain medicine.  If you are taking prescription pain medicine, take actions to prevent or treat constipation. Your health care   provider may recommend that you: ? Drink enough fluid to keep  your urine pale yellow. ? Eat foods that are high in fiber, such as fresh fruits and vegetables, whole grains, and beans. ? Limit foods that are high in fat and processed sugars, such as fried or sweet foods. ? Take an over-the-counter or prescription medicine for constipation. Lifestyle   Exercise as directed by your health care provider or physical therapist.  Practice relaxation techniques to control your stress. You may want to try: ? Biofeedback. ? Visual imagery. ? Hypnosis. ? Muscle relaxation. ? Yoga. ? Meditation.  Maintain a healthy lifestyle. This includes eating a healthy diet and getting enough sleep.  Do not use any products that contain nicotine or tobacco, such as cigarettes and e-cigarettes. If you need help quitting, ask your health care provider. General instructions  Talk to your health care provider about complementary treatments, such as acupuncture or massage.  Consider joining a support group with others who are diagnosed with this condition.  Do not do activities that stress or strain your muscles. This includes repetitive motions and heavy lifting.  Keep all follow-up visits as told by your health care provider. This is important. Where to find more information  National Fibromyalgia Association: www.fmaware.org  Arthritis Foundation: www.arthritis.org  American Chronic Pain Association: www.theacpa.org Contact a health care provider if:  You have new symptoms.  Your symptoms get worse or your pain is severe.  You have side effects from your medicines.  You have trouble sleeping.  Your condition is causing depression or anxiety. Summary  Myofascial pain syndrome and fibromyalgia are pain disorders.  Myofascial pain syndrome has tender points in the muscle that will cause pain when pressed (trigger points). Fibromyalgia also has muscle pains and tenderness that come and go, but this condition is often associated with fatigue and sleep  disturbances.  Fibromyalgia and myofascial pain syndrome are not the same but often occur together, causing pain and fatigue that make day-to-day activities difficult.  Treatment for fibromyalgia includes taking medicines to relax the muscles and medicines for pain, depression, or seizures. Treatment for myofascial pain syndrome includes taking medicines for pain, cooling and stretching of muscles, and injecting medicines into trigger points.  Follow your health care provider's instructions for taking medicines and maintaining a healthy lifestyle. This information is not intended to replace advice given to you by your health care provider. Make sure you discuss any questions you have with your health care provider. Document Revised: 06/30/2018 Document Reviewed: 03/23/2017 Elsevier Patient Education  2020 Elsevier Inc.  

## 2020-03-31 ENCOUNTER — Encounter: Payer: Self-pay | Admitting: Physician Assistant

## 2020-03-31 DIAGNOSIS — G629 Polyneuropathy, unspecified: Secondary | ICD-10-CM | POA: Insufficient documentation

## 2020-03-31 NOTE — Progress Notes (Signed)
..Virtual Visit via Telephone Note  I connected with Diane Brooks on 03/25/2020 at  2:00 PM EST by telephone and verified that I am speaking with the correct person using two identifiers.  Location: Patient: home Provider: home  .[name] KitchenParticipating in visit:  Patient: Diane Brooks Provider:Noreene Boreman Alden Hipp PA-C   I discussed the limitations, risks, security and privacy concerns of performing an evaluation and management service by telephone and the availability of in person appointments. I also discussed with the patient that there may be a patient responsible charge related to this service. The patient expressed understanding and agreed to proceed.   History of Present Illness: Patient is a 65 year old female with hypertension, type 2 diabetes, myofascial pain syndrome, polyarthralgia, fibromyalgia, MDD, anxiety who presents to the clinic for 4 week follow up.   Patient was started on Trulicity but she has not done well with this medication.  It has created a lot of GI side effects.  She does not want to continue.  She will stay on Januvia and make diet changes for now we will recheck her A1c in 2 months.  She continues to have neuropathy and pain issues.  She does wonder if it is because she gets pain-free and she goes off her Cymbalta and gabapentin.  She wonders if she can take this as needed.  She admits she has aches and pains most days.  She does have a particular pain that radiates into her left abdomen that is sharp and sometimes itchy that really bothers her.  She has been worked up by GI and nothing found. No skin issues.   .. Active Ambulatory Problems    Diagnosis Date Noted  . Hyperlipidemia 03/28/2013  . Hiatal hernia 03/28/2013  . Essential hypertension, benign 03/28/2013  . Diabetes mellitus type II, controlled ([place]) 03/28/2013  . Gastroesophageal reflux disease 05/10/2013  . Globus sensation 05/10/2013  . Atypical chest pain 05/10/2013  . Internal hemorrhoids 05/10/2013  .  Decreased libido 05/10/2013  . Dysplasia of cervix, low grade (CIN 1) 05/24/2013  . Colon cancer screening 05/25/2013  . Obesity (BMI 30-39.9) 06/27/2013  . Diabetes mellitus with microalbuminuria ([place]) 06/27/2013  . Abnormal weight gain 06/27/2013  . Hyperlipemia 06/27/2013  . Cyst, eyelid sebaceous 06/27/2013  . Onychomycosis 06/27/2013  . 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 Sudoriferous cyst 07/17/2013  . Seasonal allergies 08/03/2013  . Allergic rhinitis 08/03/2013  . Tinea versicolor 08/03/2013  . Obesity, unspecified 10/29/2013  . Hot flashes 10/29/2013  . Abdominal pain, epigastric 11/09/2013  . Migraine headache without aura 10/16/2015  . De Quervain's disease (radial styloid tenosynovitis) 11/14/2015  . BPV (benign positional vertigo) 11/14/2015  . Polyarthralgia 11/19/2015  . Mouth sores 12/15/2015  . Black stools 12/15/2015  . Light stools 03/03/2016  . Systolic murmur 75/17/0017  . Low serum vitamin B12 04/20/2017  . Memory changes 04/24/2017  . Itching 06/13/2017  . Anxiety 06/13/2017  . Lipoma of left lower extremity 06/13/2017  . Reactive airway disease 07/03/2017  . Persistent cough for 3 weeks or longer 07/03/2017  . Shortness of breath 07/07/2017  . Myofascial pain syndrome 07/14/2017  . Vitamin D deficiency 09/12/2017  . Visit for screening mammogram 09/12/2017  . Microalbuminuria 09/13/2017  . Hypertension associated with diabetes ([place]) 03/07/2018  . Blepharospasm 06/13/2018  . Anxiety as acute reaction to exceptional stress 09/07/2018  . Facet arthritis, degenerative, lumbar spine 04/16/2019  . Spondylosis without myelopathy or radiculopathy, cervical region 04/16/2019  . Left lumbar radiculitis 04/27/2019  . Radiculitis of left  cervical region 04/27/2019  . Urinary incontinence 05/18/2019  . Protrusion of lumbar intervertebral disc 05/21/2019  . DDD (degenerative disc disease), lumbar 05/21/2019  . Trochanteric bursitis, left hip 05/29/2019  .  Hyperlipidemia associated with type 2 diabetes mellitus ([place]) 12/11/2019  . Acute midline thoracic back pain 12/11/2019  . Chronic pain 01/16/2020  . Moderate episode of recurrent major depressive disorder ([place]) 01/22/2020  . Neuropathy 03/31/2020   Resolved Ambulatory Problems    Diagnosis Date Noted  . Mid back pain 10/24/2015  . Left-sided low back pain with left-sided sciatica 10/24/2015  . Neck pain 10/24/2015  . Muscle spasm of back 11/14/2015  . Chronic left-sided low back pain without sciatica 03/09/2018  . DDD (degenerative disc disease), cervical 04/16/2019   Past Medical History:  Diagnosis Date  . Diabetes mellitus without complication ([place])   . Hypertension    Reviewed med, allergy, problem list.     Observations/Objective: No acute distress No labored breathing Anxious about health    Assessment and Plan: [name] KitchenMarland KitchenDiagnoses and all orders for this visit:  Moderate episode of recurrent major depressive disorder (HCC) -     DULoxetine (CYMBALTA) 30 MG capsule; Take 1 capsule (30 mg total) by mouth daily.  Myofascial pain syndrome -     DULoxetine (CYMBALTA) 30 MG capsule; Take 1 capsule (30 mg total) by mouth daily. -     gabapentin (NEURONTIN) 300 MG capsule; One tab PO qHS for a week, then BID for a week, then TID. May double weekly to a max of 3,600mg /day  Neuropathy -     DULoxetine (CYMBALTA) 30 MG capsule; Take 1 capsule (30 mg total) by mouth daily. -     gabapentin (NEURONTIN) 300 MG capsule; One tab PO qHS for a week, then BID for a week, then TID. May double weekly to a max of 3,600mg /day  Controlled type 2 diabetes mellitus with diabetic polyneuropathy, without long-term current use of insulin ([place])   Placed Trulicity on her intolerance list.  For now we will stick with same diabetic medication with the intention of keeping her A1c under 7.  We will recheck in 2 months.  Discussed importance of staying on Cymbalta daily to stay ahead of her myofascial  pain syndrome.  She could take the gabapentin more as needed but suggested if she is having this pain regularly to stay on it daily.  She can take up to 3 times a day.  I do think some of the pain that wraps around into her left abdomen is probably neuropathic.  Titrate up on gabapentin slowly and will see how much of the pain it resolves.  Certainly she can start taking Cymbalta daily and see how much this improves her symptoms as well.  I do think this will help her mood and pain.  Discussed how pain and mood are directly linked.   Follow Up Instructions:    I discussed the assessment and treatment plan with the patient. The patient was provided an opportunity to ask questions and all were answered. The patient agreed with the plan and demonstrated an understanding of the instructions.   The patient was advised to call back or seek an in-person evaluation if the symptoms worsen or if the condition fails to improve as anticipated.  I provided 20 minutes of non-face-to-face time during this encounter.   Iran Planas, PA-C

## 2020-04-03 ENCOUNTER — Other Ambulatory Visit: Payer: Self-pay | Admitting: Physician Assistant

## 2020-04-03 ENCOUNTER — Other Ambulatory Visit: Payer: Self-pay

## 2020-04-03 DIAGNOSIS — I10 Essential (primary) hypertension: Secondary | ICD-10-CM

## 2020-04-22 ENCOUNTER — Telehealth: Payer: Self-pay

## 2020-04-22 NOTE — Telephone Encounter (Signed)
Should Pam have monthly vitamin B12 injection? Please advise.

## 2020-04-22 NOTE — Telephone Encounter (Signed)
She likely could get her b12 to where it needs to be with oral B12 1068mcg daily. I would like for her to take it daily and then see how b12 level responds.

## 2020-04-24 NOTE — Telephone Encounter (Signed)
Patient advised.

## 2020-05-09 ENCOUNTER — Ambulatory Visit (INDEPENDENT_AMBULATORY_CARE_PROVIDER_SITE_OTHER): Payer: 59 | Admitting: Physician Assistant

## 2020-05-09 ENCOUNTER — Other Ambulatory Visit: Payer: Self-pay

## 2020-05-09 VITALS — BP 124/71 | HR 68 | Ht 62.0 in | Wt 179.0 lb

## 2020-05-09 DIAGNOSIS — E538 Deficiency of other specified B group vitamins: Secondary | ICD-10-CM

## 2020-05-09 DIAGNOSIS — E1142 Type 2 diabetes mellitus with diabetic polyneuropathy: Secondary | ICD-10-CM | POA: Diagnosis not present

## 2020-05-09 DIAGNOSIS — F411 Generalized anxiety disorder: Secondary | ICD-10-CM

## 2020-05-09 DIAGNOSIS — F331 Major depressive disorder, recurrent, moderate: Secondary | ICD-10-CM | POA: Diagnosis not present

## 2020-05-09 DIAGNOSIS — G629 Polyneuropathy, unspecified: Secondary | ICD-10-CM

## 2020-05-09 DIAGNOSIS — E118 Type 2 diabetes mellitus with unspecified complications: Secondary | ICD-10-CM | POA: Diagnosis not present

## 2020-05-09 DIAGNOSIS — B351 Tinea unguium: Secondary | ICD-10-CM

## 2020-05-09 DIAGNOSIS — M7918 Myalgia, other site: Secondary | ICD-10-CM

## 2020-05-09 LAB — POCT GLYCOSYLATED HEMOGLOBIN (HGB A1C): Hemoglobin A1C: 6.2 % — AB (ref 4.0–5.6)

## 2020-05-09 MED ORDER — TERBINAFINE HCL 250 MG PO TABS: 250.0000 mg | ORAL_TABLET | Freq: Every day | ORAL | 1 refills | Status: AC

## 2020-05-09 MED ORDER — DAPAGLIFLOZIN PROPANEDIOL 10 MG PO TABS: 10.0000 mg | ORAL_TABLET | Freq: Every day | ORAL | 2 refills | Status: DC

## 2020-05-09 MED ORDER — SITAGLIPTIN PHOSPHATE 100 MG PO TABS: 100.0000 mg | ORAL_TABLET | Freq: Every day | ORAL | 0 refills | Status: DC

## 2020-05-09 MED ORDER — BUSPIRONE HCL 5 MG PO TABS
5.0000 mg | ORAL_TABLET | Freq: Three times a day (TID) | ORAL | 1 refills | Status: DC
Start: 2020-05-09 — End: 2020-12-22

## 2020-05-09 MED ORDER — DULOXETINE HCL 30 MG PO CPEP: 30.0000 mg | ORAL_CAPSULE | Freq: Every day | ORAL | 1 refills | Status: DC

## 2020-05-09 NOTE — Progress Notes (Signed)
Subjective:    Patient ID: Diane Brooks, female    DOB: [birth year], 65 y.o.   MRN: [id number]  HPI Pt is a 65 year old female with hypertension, migraines, GERD, myofascial pain, T2DM, HLD, anxiety who presents to the clinic for 3 month follow up.   Patient was taken off Metformin due to GI upset.  She is on Januvia.  She continues to have some occasional nausea and flatulence on Januvia.  It is tolerable.  Her sugars in the morning do still remain in the 130s.  She was previously in the 90s.  She denies any hypoglycemic events.  She denies any open sores or wounds.  She denies any chest pain, palpitations, headache or vision changes.  Her pain and mood are much better on cymbalta and buspar. No SI/HC. No concerns.   Pt complains of thick and yellowing bilateral great toes. She wonders if there is anything she can take. She brings in topical anti-fungal.   .. Active Ambulatory Problems    Diagnosis Date Noted  . Hyperlipidemia 03/28/2013  . Hiatal hernia 03/28/2013  . Essential hypertension, benign 03/28/2013  . Diabetes mellitus type II, controlled ([place]) 03/28/2013  . Gastroesophageal reflux disease 05/10/2013  . Globus sensation 05/10/2013  . Atypical chest pain 05/10/2013  . Internal hemorrhoids 05/10/2013  . Decreased libido 05/10/2013  . Dysplasia of cervix, low grade (CIN 1) 05/24/2013  . Colon cancer screening 05/25/2013  . Obesity (BMI 30-39.9) 06/27/2013  . Diabetes mellitus with microalbuminuria ([place]) 06/27/2013  . Abnormal weight gain 06/27/2013  . Hyperlipemia 06/27/2013  . Cyst, eyelid sebaceous 06/27/2013  . Onychomycosis 06/27/2013  . 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 Sudoriferous cyst 07/17/2013  . Seasonal allergies 08/03/2013  . Allergic rhinitis 08/03/2013  . Tinea versicolor 08/03/2013  . Obesity, unspecified 10/29/2013  . Hot flashes 10/29/2013  . Abdominal pain, epigastric 11/09/2013  . Migraine headache without aura 10/16/2015  . De Quervain's  disease (radial styloid tenosynovitis) 11/14/2015  . BPV (benign positional vertigo) 11/14/2015  . Polyarthralgia 11/19/2015  . Mouth sores 12/15/2015  . Black stools 12/15/2015  . Light stools 03/03/2016  . Systolic murmur 87/56/4332  . Low serum vitamin B12 04/20/2017  . Memory changes 04/24/2017  . Itching 06/13/2017  . Anxiety 06/13/2017  . Lipoma of left lower extremity 06/13/2017  . Reactive airway disease 07/03/2017  . Persistent cough for 3 weeks or longer 07/03/2017  . Shortness of breath 07/07/2017  . Myofascial pain syndrome 07/14/2017  . Vitamin D deficiency 09/12/2017  . Visit for screening mammogram 09/12/2017  . Microalbuminuria 09/13/2017  . Hypertension associated with diabetes ([place]) 03/07/2018  . Blepharospasm 06/13/2018  . Anxiety as acute reaction to exceptional stress 09/07/2018  . Facet arthritis, degenerative, lumbar spine 04/16/2019  . Spondylosis without myelopathy or radiculopathy, cervical region 04/16/2019  . Left lumbar radiculitis 04/27/2019  . Radiculitis of left cervical region 04/27/2019  . Urinary incontinence 05/18/2019  . Protrusion of lumbar intervertebral disc 05/21/2019  . DDD (degenerative disc disease), lumbar 05/21/2019  . Trochanteric bursitis, left hip 05/29/2019  . Hyperlipidemia associated with type 2 diabetes mellitus ([place]) 12/11/2019  . Acute midline thoracic back pain 12/11/2019  . Chronic pain 01/16/2020  . Moderate episode of recurrent major depressive disorder ([place]) 01/22/2020  . Neuropathy 03/31/2020   Resolved Ambulatory Problems    Diagnosis Date Noted  . Mid back pain 10/24/2015  . Left-sided low back pain with left-sided sciatica 10/24/2015  . Neck pain 10/24/2015  . Muscle spasm of back 11/14/2015  .  Chronic left-sided low back pain without sciatica 03/09/2018  . DDD (degenerative disc disease), cervical 04/16/2019   Past Medical History:  Diagnosis Date  . Diabetes mellitus without complication ([place])   .  Hypertension     Review of Systems  All other systems reviewed and are negative.      Objective:   Physical Exam Vitals reviewed.  Constitutional:      Appearance: Normal appearance. She is obese.  Cardiovascular:     Rate and Rhythm: Normal rate and regular rhythm.     Pulses: Normal pulses.     Heart sounds: Normal heart sounds.  Pulmonary:     Effort: Pulmonary effort is normal.     Breath sounds: Normal breath sounds.  Musculoskeletal:     Right lower leg: No edema.     Left lower leg: No edema.  Neurological:     General: No focal deficit present.     Mental Status: She is alert and oriented to person, place, and time.  Psychiatric:        Mood and Affect: Mood normal.        Behavior: Behavior normal.    .. Depression screen [facility] 2/9 05/09/2020 02/06/2020 10/26/2019 09/06/2018 07/14/2017  Decreased Interest 2 1 1  0 3  Down, Depressed, Hopeless 1 1 3 2 1   PHQ - 2 Score 3 2 4 2 4   Altered sleeping 1 1 2 1 3   Tired, decreased energy 1 0 2 0 3  Change in appetite 1 1 1 1  0  Feeling bad or failure about yourself  0 0 3 1 0  Trouble concentrating 1 0 2 0 0  Moving slowly or fidgety/restless 0 0 2 1 3   Suicidal thoughts 0 0 0 0 0  PHQ-9 Score 7 4 16 6 13   Difficult doing work/chores Not difficult at all Not difficult at all - Not difficult at all Very difficult   .[name] Kitchen GAD 7 : Generalized Anxiety Score 05/09/2020 02/06/2020 10/26/2019 09/06/2018  Nervous, Anxious, on Edge 2 1 3 2   Control/stop worrying 1 1 3 1   Worry too much - different things 1 1 3 1   Trouble relaxing 1 1 3 2   Restless 1 1 1  0  Easily annoyed or irritable 0 0 2 1  Afraid - awful might happen 1 1 3 1   Total GAD 7 Score 7 6 18 8   Anxiety Difficulty Not difficult at all - - Not difficult at all      .[name] Kitchen Lab Results  Component Value Date   HGBA1C 6.2 (A) 05/09/2020       Assessment & Plan:  [name] KitchenMarland KitchenVarnika was seen today for diabetes.  Diagnoses and all orders for this visit:  Controlled type 2  diabetes mellitus with diabetic polyneuropathy, without long-term current use of insulin (HCC) -     POCT glycosylated hemoglobin (Hb A1C) -     dapagliflozin propanediol (FARXIGA) 10 MG TABS tablet; Take 1 tablet (10 mg total) by mouth daily before breakfast.  Controlled type 2 diabetes mellitus with complication, without long-term current use of insulin (HCC) -     sitaGLIPtin (JANUVIA) 100 MG tablet; Take 1 tablet (100 mg total) by mouth daily.  Myofascial pain syndrome -     DULoxetine (CYMBALTA) 30 MG capsule; Take 1 capsule (30 mg total) by mouth daily.  Moderate episode of recurrent major depressive disorder (HCC) -     DULoxetine (CYMBALTA) 30 MG capsule; Take 1 capsule (30 mg total) by mouth daily.  Neuropathy -     DULoxetine (CYMBALTA) 30 MG capsule; Take 1 capsule (30 mg total) by mouth daily.  Toenail fungus -     terbinafine (LAMISIL) 250 MG tablet; Take 1 tablet (250 mg total) by mouth daily.  Low serum vitamin B12  GAD (generalized anxiety disorder) -     busPIRone (BUSPAR) 5 MG tablet; Take 1 tablet (5 mg total) by mouth 3 (three) times daily.   A1C improved and moving in the right direction.  Celesta Gentile still causes some GI upset but tolerable.  Add SGLT-2, farxiga. If tolerates and glucose goes way down. Consider stopping [place].  Continue to monitor glucose.  BP to goal on ARB. On statin.  .. Diabetic Foot Exam - Simple   Simple Foot Form Visual Inspection No deformities, no ulcerations, no other skin breakdown bilaterally: Yes Sensation Testing Intact to touch and monofilament testing bilaterally: Yes Pulse Check Posterior Tibialis and Dorsalis pulse intact bilaterally: Yes Comments    Eye exam UTD.  Flu/covid/pneumonia vaccines UTD.  Follow up in 3 months.   Mood and pain control doing well. STAY on cymbalta buspar.  Added lamisil for toenail fungus. Discussed prevention and treatment. Liver looks great.

## 2020-05-09 NOTE — Patient Instructions (Addendum)
Start farxiga with [place].  Lamasil for fungus.

## 2020-05-13 ENCOUNTER — Encounter: Payer: Self-pay | Admitting: Physician Assistant

## 2020-05-21 ENCOUNTER — Other Ambulatory Visit: Payer: Self-pay

## 2020-05-21 ENCOUNTER — Ambulatory Visit (INDEPENDENT_AMBULATORY_CARE_PROVIDER_SITE_OTHER): Payer: 59 | Admitting: Physician Assistant

## 2020-05-21 VITALS — BP 133/70 | HR 63 | Ht 62.0 in | Wt 178.0 lb

## 2020-05-21 DIAGNOSIS — M7918 Myalgia, other site: Secondary | ICD-10-CM

## 2020-05-21 DIAGNOSIS — J3489 Other specified disorders of nose and nasal sinuses: Secondary | ICD-10-CM | POA: Diagnosis not present

## 2020-05-21 DIAGNOSIS — G629 Polyneuropathy, unspecified: Secondary | ICD-10-CM

## 2020-05-21 MED ORDER — METHYLPREDNISOLONE ACETATE 80 MG/ML IJ SUSP
80.0000 mg | Freq: Once | INTRAMUSCULAR | Status: AC
  Administered 2020-05-21: 80 mg via INTRAMUSCULAR

## 2020-05-21 MED ORDER — TRAMADOL HCL 50 MG PO TABS: 50.0000 mg | ORAL_TABLET | Freq: Four times a day (QID) | ORAL | 0 refills | Status: AC | PRN

## 2020-05-21 MED ORDER — LEVOCETIRIZINE DIHYDROCHLORIDE 5 MG PO TABS: 5.0000 mg | ORAL_TABLET | Freq: Every evening | ORAL | 3 refills | Status: AC

## 2020-05-21 MED ORDER — GABAPENTIN 300 MG PO CAPS: ORAL_CAPSULE | ORAL | 2 refills | Status: DC

## 2020-05-21 NOTE — Progress Notes (Deleted)
d 

## 2020-05-21 NOTE — Patient Instructions (Addendum)
Gabapentin 1 tablet at least twice a day but can go up to three times a day.  Tramadol for as needed break through pain.     "Fibromyalgia Flares"   Myofascial Pain Syndrome and Fibromyalgia Myofascial pain syndrome and fibromyalgia are both pain disorders. This pain may be felt mainly in your muscles.  Myofascial pain syndrome: ? Always has tender points in the muscle that will cause pain when pressed (trigger points). The pain may come and go. ? Usually affects your neck, upper back, and shoulder areas. The pain often radiates into your arms and hands.  Fibromyalgia: ? Has muscle pains and tenderness that come and go. ? Is often associated with fatigue and sleep problems. ? Has trigger points. ? Tends to be long-lasting (chronic), but is not life-threatening. Fibromyalgia and myofascial pain syndrome are not the same. However, they often occur together. If you have both conditions, each can make the other worse. Both are common and can cause enough pain and fatigue to make day-to-day activities difficult. Both can be hard to diagnose because their symptoms are common in many other conditions. What are the causes? The exact causes of these conditions are not known. What increases the risk? You are more likely to develop this condition if:  You have a family history of the condition.  You have certain triggers, such as: ? Spine disorders. ? An injury (trauma) or other physical stressors. ? Being under a lot of stress. ? Medical conditions such as osteoarthritis, rheumatoid arthritis, or lupus. What are the signs or symptoms? Fibromyalgia The main symptom of fibromyalgia is widespread pain and tenderness in your muscles. Pain is sometimes described as stabbing, shooting, or burning. You may also have:  Tingling or numbness.  Sleep problems and fatigue.  Problems with attention and concentration (fibro fog). Other symptoms may include:  Bowel and bladder  problems.  Headaches.  Visual problems.  Problems with odors and noises.  Depression or mood changes.  Painful menstrual periods (dysmenorrhea).  Dry skin or eyes. These symptoms can vary over time. Myofascial pain syndrome Symptoms of myofascial pain syndrome include:  Tight, ropy bands of muscle.  Uncomfortable sensations in muscle areas. These may include aching, cramping, burning, numbness, tingling, and weakness.  Difficulty moving certain parts of the body freely (poor range of motion). How is this diagnosed? This condition may be diagnosed by your symptoms and medical history. You will also have a physical exam. In general:  Fibromyalgia is diagnosed if you have pain, fatigue, and other symptoms for more than 3 months, and symptoms cannot be explained by another condition.  Myofascial pain syndrome is diagnosed if you have trigger points in your muscles, and those trigger points are tender and cause pain elsewhere in your body (referred pain). How is this treated? Treatment for these conditions depends on the type that you have.  For fibromyalgia: ? Pain medicines, such as NSAIDs. ? Medicines for treating depression. ? Medicines for treating seizures. ? Medicines that relax the muscles.  For myofascial pain: ? Pain medicines, such as NSAIDs. ? Cooling and stretching of muscles. ? Trigger point injections. ? Sound wave (ultrasound) treatments to stimulate muscles. Treating these conditions often requires a team of health care providers. These may include:  Your primary care provider.  Physical therapist.  Complementary health care providers, such as massage therapists or acupuncturists.  Psychiatrist for cognitive behavioral therapy.   Follow these instructions at home: Medicines  Take over-the-counter and prescription medicines only as told by  your health care provider.  Do not drive or use heavy machinery while taking prescription pain medicine.  If  you are taking prescription pain medicine, take actions to prevent or treat constipation. Your health care provider may recommend that you: ? Drink enough fluid to keep your urine pale yellow. ? Eat foods that are high in fiber, such as fresh fruits and vegetables, whole grains, and beans. ? Limit foods that are high in fat and processed sugars, such as fried or sweet foods. ? Take an over-the-counter or prescription medicine for constipation. Lifestyle  Exercise as directed by your health care provider or physical therapist.  Practice relaxation techniques to control your stress. You may want to try: ? Biofeedback. ? Visual imagery. ? Hypnosis. ? Muscle relaxation. ? Yoga. ? Meditation.  Maintain a healthy lifestyle. This includes eating a healthy diet and getting enough sleep.  Do not use any products that contain nicotine or tobacco, such as cigarettes and e-cigarettes. If you need help quitting, ask your health care provider.   General instructions  Talk to your health care provider about complementary treatments, such as acupuncture or massage.  Consider joining a support group with others who are diagnosed with this condition.  Do not do activities that stress or strain your muscles. This includes repetitive motions and heavy lifting.  Keep all follow-up visits as told by your health care provider. This is important. Where to find more information  National Fibromyalgia Association: www.fmaware.[place]: www.arthritis.org  American Chronic Pain Association: www.theacpa.org Contact a health care provider if:  You have new symptoms.  Your symptoms get worse or your pain is severe.  You have side effects from your medicines.  You have trouble sleeping.  Your condition is causing depression or anxiety. Summary  Myofascial pain syndrome and fibromyalgia are pain disorders.  Myofascial pain syndrome has tender points in the muscle that will cause  pain when pressed (trigger points). Fibromyalgia also has muscle pains and tenderness that come and go, but this condition is often associated with fatigue and sleep disturbances.  Fibromyalgia and myofascial pain syndrome are not the same but often occur together, causing pain and fatigue that make day-to-day activities difficult.  Treatment for fibromyalgia includes taking medicines to relax the muscles and medicines for pain, depression, or seizures. Treatment for myofascial pain syndrome includes taking medicines for pain, cooling and stretching of muscles, and injecting medicines into trigger points.  Follow your health care provider's instructions for taking medicines and maintaining a healthy lifestyle. This information is not intended to replace advice given to you by your health care provider. Make sure you discuss any questions you have with your health care provider. Document Revised: 06/30/2018 Document Reviewed: 03/23/2017 Elsevier Patient Education  2021 [employer].

## 2020-05-26 ENCOUNTER — Encounter: Payer: Self-pay | Admitting: Physician Assistant

## 2020-05-26 DIAGNOSIS — J3489 Other specified disorders of nose and nasal sinuses: Secondary | ICD-10-CM | POA: Insufficient documentation

## 2020-05-26 NOTE — Progress Notes (Signed)
Subjective:    Patient ID: Diane Brooks, female    DOB: 1956-02-03, 65 y.o.   MRN: [id number]  HPI  Pt is a 65 yo female with HTN, T2DM, Migraines, myofascial pain syndrome, polyarthralgia, MDD, Anxiety who presents to the clinic with worsening all over body pain. She has had flares like this before. She aches from her head to her toes. She is tender to touch. No fever, chills, body aches. She does have some sinus pressure and headache more than usually. She is taking her cymbalta but not gabapentin. She "just aches all over". No injuries.   .. Active Ambulatory Problems    Diagnosis Date Noted  . Hyperlipidemia 03/28/2013  . Hiatal hernia 03/28/2013  . Essential hypertension, benign 03/28/2013  . Diabetes mellitus type II, controlled ([place]) 03/28/2013  . Gastroesophageal reflux disease 05/10/2013  . Globus sensation 05/10/2013  . Atypical chest pain 05/10/2013  . Internal hemorrhoids 05/10/2013  . Decreased libido 05/10/2013  . Dysplasia of cervix, low grade (CIN 1) 05/24/2013  . Colon cancer screening 05/25/2013  . Obesity (BMI 30-39.9) 06/27/2013  . Diabetes mellitus with microalbuminuria ([place]) 06/27/2013  . Abnormal weight gain 06/27/2013  . Hyperlipemia 06/27/2013  . Cyst, eyelid sebaceous 06/27/2013  . Onychomycosis 06/27/2013  . 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 Sudoriferous cyst 07/17/2013  . Seasonal allergies 08/03/2013  . Allergic rhinitis 08/03/2013  . Tinea versicolor 08/03/2013  . Obesity, unspecified 10/29/2013  . Hot flashes 10/29/2013  . Abdominal pain, epigastric 11/09/2013  . Migraine headache without aura 10/16/2015  . De Quervain's disease (radial styloid tenosynovitis) 11/14/2015  . BPV (benign positional vertigo) 11/14/2015  . Polyarthralgia 11/19/2015  . Mouth sores 12/15/2015  . Black stools 12/15/2015  . Light stools 03/03/2016  . Systolic murmur 38/46/6599  . Low serum vitamin B12 04/20/2017  . Memory changes 04/24/2017  . Itching 06/13/2017   . Anxiety 06/13/2017  . Lipoma of left lower extremity 06/13/2017  . Reactive airway disease 07/03/2017  . Persistent cough for 3 weeks or longer 07/03/2017  . Shortness of breath 07/07/2017  . Myofascial pain syndrome 07/14/2017  . Vitamin D deficiency 09/12/2017  . Visit for screening mammogram 09/12/2017  . Microalbuminuria 09/13/2017  . Hypertension associated with diabetes ([place]) 03/07/2018  . Blepharospasm 06/13/2018  . Anxiety as acute reaction to exceptional stress 09/07/2018  . Facet arthritis, degenerative, lumbar spine 04/16/2019  . Spondylosis without myelopathy or radiculopathy, cervical region 04/16/2019  . Left lumbar radiculitis 04/27/2019  . Radiculitis of left cervical region 04/27/2019  . Urinary incontinence 05/18/2019  . Protrusion of lumbar intervertebral disc 05/21/2019  . DDD (degenerative disc disease), lumbar 05/21/2019  . Trochanteric bursitis, left hip 05/29/2019  . Hyperlipidemia associated with type 2 diabetes mellitus ([place]) 12/11/2019  . Acute midline thoracic back pain 12/11/2019  . Chronic pain 01/16/2020  . Moderate episode of recurrent major depressive disorder ([place]) 01/22/2020  . Neuropathy 03/31/2020  . Sinus pressure 05/26/2020   Resolved Ambulatory Problems    Diagnosis Date Noted  . Mid back pain 10/24/2015  . Left-sided low back pain with left-sided sciatica 10/24/2015  . Neck pain 10/24/2015  . Muscle spasm of back 11/14/2015  . Chronic left-sided low back pain without sciatica 03/09/2018  . DDD (degenerative disc disease), cervical 04/16/2019   Past Medical History:  Diagnosis Date  . Diabetes mellitus without complication ([place])   . Hypertension          Review of Systems    see HPI.  Objective:  Physical Exam Vitals reviewed.  Constitutional:      Appearance: She is well-developed. She is obese.  HENT:     Head: Normocephalic.     Mouth/Throat:     Mouth: Mucous membranes are moist.  Cardiovascular:     Rate and  Rhythm: Normal rate and regular rhythm.     Heart sounds: Normal heart sounds.  Pulmonary:     Effort: Pulmonary effort is normal.     Breath sounds: Normal breath sounds.  Abdominal:     General: Bowel sounds are normal.     Palpations: Abdomen is soft.  Musculoskeletal:     Cervical back: No rigidity.     Comments: Tenderness to palpation over muscles of body from head to toe.   Lymphadenopathy:     Cervical: No cervical adenopathy.  Skin:    Findings: No rash.  Neurological:     Mental Status: She is alert and oriented to person, place, and time.  Psychiatric:        Mood and Affect: Mood normal.        Speech: Speech normal.        Behavior: Behavior normal.           Assessment & Plan:  [name] KitchenMarland KitchenViolett was seen today for headache and back pain.  Diagnoses and all orders for this visit:  Sinus pressure -     levocetirizine (XYZAL) 5 MG tablet; Take 1 tablet (5 mg total) by mouth every evening.  Myofascial pain syndrome -     gabapentin (NEURONTIN) 300 MG capsule; One tab PO qHS for a week, then BID for a week, then TID. May double weekly to a max of 3,600mg /day -     traMADol (ULTRAM) 50 MG tablet; Take 1 tablet (50 mg total) by mouth every 6 (six) hours as needed for up to 5 days. -     methylPREDNISolone acetate (DEPO-MEDROL) injection 80 mg  Neuropathy -     gabapentin (NEURONTIN) 300 MG capsule; One tab PO qHS for a week, then BID for a week, then TID. May double weekly to a max of 3,600mg /day   Discussed with patient this sounds like fibromyaglia flare. Drink plenty of water. Walk daily. Decrease sugars and processed foods.  Continue cymbalta.  Continue aimovig for migraines.  Add xyzal for allergies.  Add gabapentin daily for chronic pain.  Tramadol given for break through pain.  [name] Kitchen.PDMP reviewed during this encounter.

## 2020-05-31 ENCOUNTER — Other Ambulatory Visit: Payer: Self-pay | Admitting: Physician Assistant

## 2020-05-31 DIAGNOSIS — I10 Essential (primary) hypertension: Secondary | ICD-10-CM

## 2020-06-27 ENCOUNTER — Other Ambulatory Visit: Payer: Self-pay

## 2020-06-27 ENCOUNTER — Encounter: Payer: Self-pay | Admitting: Family Medicine

## 2020-06-27 ENCOUNTER — Ambulatory Visit (INDEPENDENT_AMBULATORY_CARE_PROVIDER_SITE_OTHER): Payer: 59 | Admitting: Family Medicine

## 2020-06-27 DIAGNOSIS — K148 Other diseases of tongue: Secondary | ICD-10-CM | POA: Diagnosis not present

## 2020-06-27 MED ORDER — NYSTATIN 100000 UNIT/ML MT SUSP: 5.0000 mL | Freq: Three times a day (TID) | OROMUCOSAL | 0 refills | Status: DC | PRN

## 2020-06-29 DIAGNOSIS — K148 Other diseases of tongue: Secondary | ICD-10-CM | POA: Insufficient documentation

## 2020-06-29 NOTE — Assessment & Plan Note (Signed)
Possible enlarged/irritated papillae.  Will see if she has improvement with magic mouthwash.  Recommend that if this isn't clearing up we have her see oral surgeon to discuss biopsy.

## 2020-06-29 NOTE — Progress Notes (Signed)
Diane Brooks - 65 y.o. female MRN [id number]  Date of birth: 10-24-1955  Subjective Chief Complaint  Patient presents with  . tongue bump    HPI Diane Brooks is a 65 y.o. female here today for complaint of bump on her tongue.  First noticed a few days ago.  Area is somewhat painful.  Does not recall any trauma to this area. No fever or recent illness.  She has not tried anything for treatment at home.    ROS:  A comprehensive ROS was completed and negative except as noted per HPI   Allergies  Allergen Reactions  . Metformin And Related     Nausea/GI upset  . Trulicity [Dulaglutide]     Nausea/GI upset  . Lisinopril Cough    Past Medical History:  Diagnosis Date  . Diabetes mellitus without complication ([place])   . Hyperlipidemia   . Hypertension     Past Surgical History:  Procedure Laterality Date  . AUGMENTATION MAMMAPLASTY    . BREAST ENHANCEMENT SURGERY    . ESOPHAGEAL MANOMETRY N/A 07/02/2013   Procedure: ESOPHAGEAL MANOMETRY (EM);  Surgeon: Jerene Bears, MD;  Location: [facility];  Service: Gastroenterology;  Laterality: N/A;  . TONSILECTOMY/ADENOIDECTOMY WITH MYRINGOTOMY    . TUBAL LIGATION    . tummy tuck      Social History   Socioeconomic History  . Marital status: Married    Spouse name: Not on file  . Number of children: 2  . Years of education: Not on file  . Highest education level: Not on file  Occupational History  . Occupation: Retired  Tobacco Use  . Smoking status: Former Smoker    Quit date: 03/23/1991    Years since quitting: 29.2  . Smokeless tobacco: Never Used  Substance and Sexual Activity  . Alcohol use: No  . Drug use: No  . Sexual activity: Yes    Birth control/protection: Surgical, Post-menopausal  Other Topics Concern  . Not on file  Social History Narrative  . Not on file   Social Determinants of Health   Financial Resource Strain: Not on file  Food Insecurity: Not on file  Transportation Needs: Not on file  Physical  Activity: Not on file  Stress: Not on file  Social Connections: Not on file    Family History  Problem Relation Age of Onset  . Hyperlipidemia Mother   . Stroke Mother   . Diabetes Father   . Hyperlipidemia Father   . Hypertension Father   . Diabetes Maternal Grandfather   . Hyperlipidemia Maternal Grandfather   . Diabetes Paternal Grandfather   . Hyperlipidemia Paternal Grandfather     Health Maintenance  Topic Date Due  . OPHTHALMOLOGY EXAM  07/30/2020  . INFLUENZA VACCINE  10/20/2020  . HEMOGLOBIN A1C  11/06/2020  . FOOT EXAM  05/09/2021  . MAMMOGRAM  01/01/2022  . PAP SMEAR-Modifier  12/24/2022  . TETANUS/TDAP  03/06/2023  . COLONOSCOPY (Pts 45-52yrs Insurance coverage will need to be confirmed)  03/01/2029  . PNEUMOCOCCAL POLYSACCHARIDE VACCINE AGE 81-64 HIGH RISK  Completed  . COVID-19 Vaccine  Completed  . Hepatitis C Screening  Completed  . HIV Screening  Completed  . HPV VACCINES  Aged Out     ----------------------------------------------------------------------------------------------------------------------------------------------------------------------------------------------------------------- Physical Exam BP 130/61 (BP Location: Left Arm, Patient Position: Sitting, Cuff Size: Normal)   Pulse 65   Wt 181 lb 12.8 oz (82.5 kg)   SpO2 96%   BMI 33.25 kg/m   Physical Exam Constitutional:  Appearance: Normal appearance.  HENT:     Head: Normocephalic and atraumatic.  Eyes:     General: No scleral icterus. Cardiovascular:     Rate and Rhythm: Normal rate and regular rhythm.  Pulmonary:     Effort: Pulmonary effort is normal.     Breath sounds: Normal breath sounds.  Musculoskeletal:     Cervical back: Neck supple.  Lymphadenopathy:     Cervical: No cervical adenopathy.  Neurological:     Mental Status: She is alert.  Psychiatric:        Mood and Affect: Mood normal.        Behavior: Behavior normal.      ------------------------------------------------------------------------------------------------------------------------------------------------------------------------------------------------------------------- Assessment and Plan  Tongue lesion Possible enlarged/irritated papillae.  Will see if she has improvement with magic mouthwash.  Recommend that if this isn't clearing up we have her see oral surgeon to discuss biopsy.    Meds ordered this encounter  Medications  . magic mouthwash (nystatin, lidocaine, diphenhydrAMINE, alum & mag hydroxide) suspension    Sig: Swish and spit 5 mLs 3 (three) times daily as needed for mouth pain. 1 part diphenhydramine 12.5 mg per 5 mL elixir, 1 part Maalox, 1 part nystatin, 1 part 2% viscous lidocaine.    Dispense:  180 mL    Refill:  0    No follow-ups on file.    This visit occurred during the SARS-CoV-2 public health emergency.  Safety protocols were in place, including screening questions prior to the visit, additional usage of staff PPE, and extensive cleaning of exam room while observing appropriate contact time as indicated for disinfecting solutions.

## 2020-07-07 ENCOUNTER — Other Ambulatory Visit: Payer: Self-pay

## 2020-07-07 ENCOUNTER — Ambulatory Visit (INDEPENDENT_AMBULATORY_CARE_PROVIDER_SITE_OTHER): Payer: 59 | Admitting: Sports Medicine

## 2020-07-07 ENCOUNTER — Encounter: Payer: Self-pay | Admitting: Sports Medicine

## 2020-07-07 DIAGNOSIS — L301 Dyshidrosis [pompholyx]: Secondary | ICD-10-CM | POA: Diagnosis not present

## 2020-07-07 DIAGNOSIS — K148 Other diseases of tongue: Secondary | ICD-10-CM | POA: Diagnosis not present

## 2020-07-07 MED ORDER — CLOBETASOL PROPIONATE 0.05 % EX CREA: TOPICAL_CREAM | CUTANEOUS | 2 refills | Status: DC

## 2020-07-07 NOTE — Assessment & Plan Note (Signed)
This is a pleasant 64 year old female, for 2 weeks now she has had a lesion on the right side of her tongue, it is minimally painful. She was prescribed Magic mouthwash, she feels like over time this has improved, but examination she has a small pedunculated papule on the right side of her tongue, it is nontender, nonerythematous. My advised her to be watch this for at least another 2 weeks before considering ENT referral for biopsy. It did seem to pop up fairly quickly and be very painful which is typically unlike a neoplasm. Return to see me in 2 weeks.

## 2020-07-07 NOTE — Progress Notes (Signed)
    Procedures performed today:    None.  Independent interpretation of notes and tests performed by another provider:   None.  Brief History, Exam, Impression, and Recommendations:    Tongue lesion This is a pleasant 65 year old female, for 2 weeks now she has had a lesion on the right side of her tongue, it is minimally painful. She was prescribed Magic mouthwash, she feels like over time this has improved, but examination she has a small pedunculated papule on the right side of her tongue, it is nontender, nonerythematous. My advised her to be watch this for at least another 2 weeks before considering ENT referral for biopsy. It did seem to pop up fairly quickly and be very painful which is typically unlike a neoplasm. Return to see me in 2 weeks.  Dyshidrotic eczema Adding topical clobetasol, she will apply the clobetasol twice daily and occlude with a glove for now.    ___________________________________________ Gwen Her. Dianah Field, M.D., ABFM., CAQSM. Primary Care and [place] In[employer] of [place] [employer] [facility]

## 2020-07-07 NOTE — Patient Instructions (Signed)
Dyshidrotic Eczema Dyshidrotic eczema, also known as pompholyx, is a type of eczema that causes very itchy, fluid-filled blisters (vesicles) to form on the hands and feet. It is more common before age 63, though it can affect people of any age. There is no cure, but treatment and certain lifestyle changes can help relieve symptoms. What are the causes? The cause of this condition is not known. What increases the risk? You are more likely to develop this condition if:  You wash your hands frequently.  You have a personal or family history of eczema, allergies, asthma, or hay fever.  You are allergic to metals, such as nickel or cobalt.  You work with cement.  You smoke. What are the signs or symptoms? Symptoms of this condition may affect the hands, the feet, or both. Symptoms may come and go (recur), and may include:  Severe itching. This may happen before blisters appear.  Blisters. These may form suddenly. ? In the early stages, blisters may form near the fingertips. ? In severe cases, blisters may grow to large blister masses (bullae). ? Blisters resolve in 2-3 weeks without bursting. This is followed by a dry phase in which itching eases.  Pain and swelling.  Cracks or long, narrow openings (fissures) in the skin.  Severe dryness.  Ridges on the nails. How is this diagnosed? This condition may be diagnosed based on:  Your symptoms and a physical exam.  Your medical history.  Skin scrapings to rule out a fungal infection.  Testing a swab of fluid for bacteria (culture).  Removing a small piece of skin (biopsy) to test for infection or to rule out other conditions.  Skin patch tests. These tests involve using patches that contain possible allergens and placing them on your back. Your health care provider will wait a few days and then check to see if an allergic reaction occurred. These tests may be done if your health care provider suspects allergic reactions, or to  rule out other types of eczema. You may be referred to a health care provider who specializes in skin conditions (dermatologist) to help diagnose and treat this condition. How is this treated? There is no cure for this condition, but treatment can help relieve symptoms. Depending on the amount and severity of the blisters, your health care provider may suggest:  Avoiding allergens, irritants, or triggers that worsen symptoms. This may involve lifestyle changes, such as: ? Using different lotions or soaps. ? Avoiding hot weather or places that will cause you to sweat a lot. ? Managing stress with coping techniques, such as relaxation and exercise, and asking for help when you need it. ? Diet changes as recommended by your health care provider.  Using a clean, damp towel (cool compress) to relieve symptoms.  Soaking in a bath that contains a type of salt that relieves irritation (aluminum acetate soaks).  Medicines, such as: ? Medicine taken by mouth to reduce itching (oral antihistamines). ? Medicine applied to the skin to reduce swelling and irritation (topical corticosteroids). ? Medicine that reduces the activity of the body's disease-fighting system (immunosuppressants) to treat inflammation. This may be given in severe cases. ? Antibiotic medicines to treat bacterial infection.  Light therapy (phototherapy). This involves shining ultraviolet (UV) light on the affected skin in order to reduce itchiness and inflammation.   Follow these instructions at home: Bathing and skin care  Wash skin gently. After bathing or washing your hands, pat your skin dry. Avoid rubbing your skin.  Remove  all jewelry before bathing. If the skin under the jewelry stays wet, blisters may form or get worse.  Apply cool compresses as told by your health care provider. To do this: ? Soak a clean towel in cool water. ? Wring out excess water until towel is damp. ? Place the towel over the affected skin. Leave  the towel on for 20 minutes at a time, 2-3 times a day.  Use mild soaps, cleansers, and lotions that do not contain dyes, perfumes, or other irritants.  Keep your skin hydrated. To do this: ? Avoid very hot water. Take lukewarm baths or showers. ? Apply moisturizer within 3 minutes of bathing. This locks in moisture.   Medicines  Take and apply over-the-counter and prescription medicines only as told by your health care provider.  If you were prescribed an antibiotic medicine, take or apply it as told by your health care provider. Do not stop using the antibiotic even if you start to feel better. General instructions  Do not use any products that contain nicotine or tobacco. These include cigarettes, chewing tobacco, and vaping devices, such as e-cigarettes. If you need help quitting, ask your health care provider.  Identify and avoid triggers and allergens.  Keep fingernails short to avoid breaking the skin while scratching.  Use waterproof gloves to protect your hands when doing work that keeps your hands wet for a long time.  Wear socks to keep your feet dry.  Keep all follow-up visits. This is important. Contact a health care provider if:  You have symptoms that do not go away.  You have signs of infection, such as: ? Crusting, pus, or a bad smell. ? More redness, swelling, or pain. ? Increased warmth in the affected area. Get help right away if:  Your skin gets streaking redness with associated pain. Summary  Dyshidrotic eczema, also known as pompholyx, is a type of eczema that causes very itchy, fluid-filled blisters (vesicles) to form on the hands and feet.  The cause of this condition is not known.  There is no cure for this condition, but treatment can help relieve symptoms. Treatment depends on the amount and severity of the blisters.  Use mild soaps, cleansers, and lotions that do not contain dyes, perfumes, or other irritants. Keep your skin hydrated. This  information is not intended to replace advice given to you by your health care provider. Make sure you discuss any questions you have with your health care provider. Document Revised: 12/17/2019 Document Reviewed: 12/17/2019 Elsevier Patient Education  2021 [employer].

## 2020-07-07 NOTE — Assessment & Plan Note (Signed)
Adding topical clobetasol, she will apply the clobetasol twice daily and occlude with a glove for now.

## 2020-07-08 ENCOUNTER — Telehealth: Payer: Self-pay

## 2020-07-08 DIAGNOSIS — L301 Dyshidrosis [pompholyx]: Secondary | ICD-10-CM

## 2020-07-08 MED ORDER — CLOBETASOL PROPIONATE 0.05 % EX CREA: TOPICAL_CREAM | CUTANEOUS | 2 refills | Status: AC

## 2020-07-08 NOTE — Telephone Encounter (Signed)
The pharmacy states the Clobetasol is not covered by insurance. The price is $200. The pharmacy did recommend triamcinolone because it is covered by most insurance plans.

## 2020-07-08 NOTE — Telephone Encounter (Signed)
It is only ~$20 at [facility] with a good Rx coupon, they are playing her!  I will resend the prescription with a good Rx coupon attached.

## 2020-07-08 NOTE — Telephone Encounter (Signed)
Patient advised.

## 2020-07-15 ENCOUNTER — Ambulatory Visit (INDEPENDENT_AMBULATORY_CARE_PROVIDER_SITE_OTHER): Payer: 59 | Admitting: Physician Assistant

## 2020-07-15 ENCOUNTER — Other Ambulatory Visit: Payer: Self-pay

## 2020-07-15 VITALS — BP 120/64 | HR 63 | Ht 62.0 in | Wt 180.0 lb

## 2020-07-15 DIAGNOSIS — E118 Type 2 diabetes mellitus with unspecified complications: Secondary | ICD-10-CM

## 2020-07-15 DIAGNOSIS — K148 Other diseases of tongue: Secondary | ICD-10-CM

## 2020-07-15 DIAGNOSIS — J302 Other seasonal allergic rhinitis: Secondary | ICD-10-CM | POA: Diagnosis not present

## 2020-07-15 DIAGNOSIS — E1142 Type 2 diabetes mellitus with diabetic polyneuropathy: Secondary | ICD-10-CM | POA: Diagnosis not present

## 2020-07-15 MED ORDER — SITAGLIPTIN PHOSPHATE 100 MG PO TABS: 100.0000 mg | ORAL_TABLET | Freq: Every day | ORAL | 0 refills | Status: DC

## 2020-07-15 MED ORDER — DAPAGLIFLOZIN PROPANEDIOL 10 MG PO TABS: 10.0000 mg | ORAL_TABLET | Freq: Every day | ORAL | 0 refills | Status: DC

## 2020-07-15 NOTE — Patient Instructions (Signed)
xzyal 5mg  daily for allergies.

## 2020-07-15 NOTE — Progress Notes (Signed)
Subjective:    Patient ID: Diane Brooks, female    DOB: October 24, 1955, 65 y.o.   MRN: [id number]  HPI  Pt is a 65 yo female with T2DM, HTN, HLD who presents to the clinic for follow up.   She has been checking her sugars at home and 110, 116, 120 in am. No hypoglycemia. Doing well on current medications. Trying to stay active and eat healthy.   Denies any cP, palpitations, headache or vision changes.   Overall her pain is much better controlled.   She noticed a tongue lesion and saw Dr. Darene Lamer last week. It does seem to be getting better. Not painful. No fever, chills. Does not remembering biting her tongue. Concerned about it. Does not smoke.    .. Active Ambulatory Problems    Diagnosis Date Noted  . Hyperlipidemia 03/28/2013  . Hiatal hernia 03/28/2013  . Essential hypertension, benign 03/28/2013  . Diabetes mellitus type II, controlled ([place]) 03/28/2013  . Gastroesophageal reflux disease 05/10/2013  . Globus sensation 05/10/2013  . Atypical chest pain 05/10/2013  . Internal hemorrhoids 05/10/2013  . Decreased libido 05/10/2013  . Dysplasia of cervix, low grade (CIN 1) 05/24/2013  . Colon cancer screening 05/25/2013  . Obesity (BMI 30-39.9) 06/27/2013  . Diabetes mellitus with microalbuminuria ([place]) 06/27/2013  . Abnormal weight gain 06/27/2013  . Hyperlipemia 06/27/2013  . Cyst, eyelid sebaceous 06/27/2013  . Onychomycosis 06/27/2013  . 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 Sudoriferous cyst 07/17/2013  . Seasonal allergies 08/03/2013  . Allergic rhinitis 08/03/2013  . Tinea versicolor 08/03/2013  . Obesity, unspecified 10/29/2013  . Hot flashes 10/29/2013  . Abdominal pain, epigastric 11/09/2013  . Migraine headache without aura 10/16/2015  . De Quervain's disease (radial styloid tenosynovitis) 11/14/2015  . BPV (benign positional vertigo) 11/14/2015  . Polyarthralgia 11/19/2015  . Mouth sores 12/15/2015  . Black stools 12/15/2015  . Light stools 03/03/2016  . Systolic  murmur 17/61/6073  . Low serum vitamin B12 04/20/2017  . Memory changes 04/24/2017  . Itching 06/13/2017  . Anxiety 06/13/2017  . Lipoma of left lower extremity 06/13/2017  . Reactive airway disease 07/03/2017  . Persistent cough for 3 weeks or longer 07/03/2017  . Shortness of breath 07/07/2017  . Myofascial pain syndrome 07/14/2017  . Vitamin D deficiency 09/12/2017  . Visit for screening mammogram 09/12/2017  . Microalbuminuria 09/13/2017  . Hypertension associated with diabetes ([place]) 03/07/2018  . Blepharospasm 06/13/2018  . Anxiety as acute reaction to exceptional stress 09/07/2018  . Facet arthritis, degenerative, lumbar spine 04/16/2019  . Spondylosis without myelopathy or radiculopathy, cervical region 04/16/2019  . Left lumbar radiculitis 04/27/2019  . Radiculitis of left cervical region 04/27/2019  . Urinary incontinence 05/18/2019  . Protrusion of lumbar intervertebral disc 05/21/2019  . DDD (degenerative disc disease), lumbar 05/21/2019  . Trochanteric bursitis, left hip 05/29/2019  . Hyperlipidemia associated with type 2 diabetes mellitus ([place]) 12/11/2019  . Acute midline thoracic back pain 12/11/2019  . Chronic pain 01/16/2020  . Moderate episode of recurrent major depressive disorder ([place]) 01/22/2020  . Neuropathy 03/31/2020  . Sinus pressure 05/26/2020  . Tongue lesion 06/29/2020  . Dyshidrotic eczema 07/07/2020   Resolved Ambulatory Problems    Diagnosis Date Noted  . Mid back pain 10/24/2015  . Left-sided low back pain with left-sided sciatica 10/24/2015  . Neck pain 10/24/2015  . Muscle spasm of back 11/14/2015  . Chronic left-sided low back pain without sciatica 03/09/2018  . DDD (degenerative disc disease), cervical 04/16/2019   Past  Medical History:  Diagnosis Date  . Diabetes mellitus without complication ([place])   . Hypertension     Review of Systems  All other systems reviewed and are negative.      Objective:   Physical Exam Vitals  reviewed.  Constitutional:      Appearance: Normal appearance. She is obese.  HENT:     Head: Normocephalic.     Mouth/Throat:     Comments: See picture. Right sided tongue lesion. Flesh colored.  Cardiovascular:     Rate and Rhythm: Normal rate and regular rhythm.     Pulses: Normal pulses.  Pulmonary:     Effort: Pulmonary effort is normal.     Breath sounds: Normal breath sounds.  Neurological:     General: No focal deficit present.     Mental Status: She is alert and oriented to person, place, and time.  Psychiatric:        Mood and Affect: Mood normal.           Assessment & Plan:  [name] KitchenMarland KitchenCharlesia was seen today for diabetes.  Diagnoses and all orders for this visit:  Tongue lesion  Controlled type 2 diabetes mellitus with complication, without long-term current use of insulin (HCC) -     sitaGLIPtin (JANUVIA) 100 MG tablet; Take 1 tablet (100 mg total) by mouth daily.  Controlled type 2 diabetes mellitus with diabetic polyneuropathy, without long-term current use of insulin (HCC) -     dapagliflozin propanediol (FARXIGA) 10 MG TABS tablet; Take 1 tablet (10 mg total) by mouth daily before breakfast.  Seasonal allergies   Too soon for A!C. Home sugar readgins appear to be controlled.  Continue medication.  Follow up in 1 month.   xyzal for allergies.   Tongue lesions. Per pt is improving. That is reassuring. Took picture. Will make referral to ENT for biospy if still present in the next 2 weeks should consider biospy. Appears benign and more flesh colored.

## 2020-07-21 ENCOUNTER — Ambulatory Visit: Payer: 59 | Admitting: Sports Medicine

## 2020-07-21 ENCOUNTER — Ambulatory Visit: Payer: 59 | Admitting: Obstetrics & Gynecology

## 2020-07-21 ENCOUNTER — Encounter: Payer: Self-pay | Admitting: Physician Assistant

## 2020-07-28 ENCOUNTER — Ambulatory Visit: Payer: 59 | Admitting: Obstetrics & Gynecology

## 2020-08-06 ENCOUNTER — Ambulatory Visit: Payer: 59 | Admitting: Physician Assistant

## 2020-08-20 ENCOUNTER — Telehealth: Payer: Self-pay | Admitting: Physician Assistant

## 2020-08-20 ENCOUNTER — Other Ambulatory Visit: Payer: Self-pay | Admitting: Physician Assistant

## 2020-08-20 DIAGNOSIS — E1142 Type 2 diabetes mellitus with diabetic polyneuropathy: Secondary | ICD-10-CM

## 2020-08-20 DIAGNOSIS — K224 Dyskinesia of esophagus: Secondary | ICD-10-CM

## 2020-08-20 DIAGNOSIS — E118 Type 2 diabetes mellitus with unspecified complications: Secondary | ICD-10-CM

## 2020-08-20 DIAGNOSIS — K219 Gastro-esophageal reflux disease without esophagitis: Secondary | ICD-10-CM

## 2020-08-20 NOTE — Telephone Encounter (Signed)
Patient came in and stated she needed a refill of medications listed below:    sitaGLIPtin (JANUVIA) 100 MG tablet   pantoprazole (PROTONIX) 40 MG tablet    dapagliflozin propanediol (FARXIGA) 10 MG TABS tablet       [facility] [place], [place] Phone:  [phone]  Fax:  [phone]

## 2020-08-21 NOTE — Telephone Encounter (Signed)
Sent!

## 2020-08-25 ENCOUNTER — Ambulatory Visit (INDEPENDENT_AMBULATORY_CARE_PROVIDER_SITE_OTHER): Payer: 59 | Admitting: Otolaryngology

## 2020-08-25 ENCOUNTER — Other Ambulatory Visit (HOSPITAL_COMMUNITY)
Admission: RE | Admit: 2020-08-25 | Discharge: 2020-08-25 | Disposition: A | Discharge status: Home / Self Care | Payer: 59 | Source: Ambulatory Visit | Attending: Obstetrics & Gynecology | Admitting: Obstetrics & Gynecology

## 2020-08-25 ENCOUNTER — Ambulatory Visit (INDEPENDENT_AMBULATORY_CARE_PROVIDER_SITE_OTHER): Payer: 59 | Admitting: Obstetrics & Gynecology

## 2020-08-25 ENCOUNTER — Encounter: Payer: Self-pay | Admitting: Obstetrics & Gynecology

## 2020-08-25 ENCOUNTER — Other Ambulatory Visit: Payer: Self-pay

## 2020-08-25 VITALS — BP 160/74 | HR 61 | Resp 16 | Ht 62.0 in | Wt 184.0 lb

## 2020-08-25 VITALS — Temp 96.6°F

## 2020-08-25 DIAGNOSIS — R102 Pelvic and perineal pain: Secondary | ICD-10-CM

## 2020-08-25 DIAGNOSIS — R140 Abdominal distension (gaseous): Secondary | ICD-10-CM

## 2020-08-25 DIAGNOSIS — N950 Postmenopausal bleeding: Secondary | ICD-10-CM

## 2020-08-25 DIAGNOSIS — R197 Diarrhea, unspecified: Secondary | ICD-10-CM

## 2020-08-25 DIAGNOSIS — D101 Benign neoplasm of tongue: Secondary | ICD-10-CM | POA: Diagnosis not present

## 2020-08-25 DIAGNOSIS — J310 Chronic rhinitis: Secondary | ICD-10-CM | POA: Diagnosis not present

## 2020-08-25 NOTE — Progress Notes (Signed)
   Subjective:    Patient ID: Diane Brooks, female    DOB: Aug 15, 1955, 65 y.o.   MRN: [id number]  HPI  Diane Brooks is a 65 year old female who presents for 2-1/2 months of brown spotting.  It happens occasionally some weeks there is presence of spotting other weeks they are not.  Patient is not sexually active.  She also complaining of loose stools several times a day.  Lots of flatulence also.  Patient has nausea but no vomiting.  She has normal colonoscopy a year ago.  Patient denies blood or mucus in her stools.  She has not been camping or drinking from a stream.  Patient started a new diabetes medication about the same time her gastro intestinal complaints started.  Patient feels like her underwear is wet and she is unsure if it is vaginal discharge or leakage of urine.  Sometimes she wears a pad.  She has a history of D&C hysteroscopy in [facility] with benign pathology.  Review of Systems  Constitutional: Negative.   HENT:       Small mass on left side of tongue  Respiratory: Negative.   Cardiovascular: Negative.   Gastrointestinal: Positive for abdominal distention, abdominal pain, diarrhea and nausea.  Genitourinary: Positive for pelvic pain and vaginal bleeding.       Objective:   Physical Exam Vitals reviewed.  Constitutional:      General: She is not in acute distress.    Appearance: She is well-developed.  HENT:     Head: Normocephalic and atraumatic.  Eyes:     Conjunctiva/sclera: Conjunctivae normal.  Cardiovascular:     Rate and Rhythm: Normal rate.  Pulmonary:     Effort: Pulmonary effort is normal.  Abdominal:     General: Bowel sounds are normal. There is no distension.     Palpations: Abdomen is soft. There is no mass.     Tenderness: There is abdominal tenderness. There is no guarding or rebound.  Genitourinary:    Comments: Tanner V Vulva:  Right labia minora is atrophied  Vagina:  Atrophic and red, small amt clear discharge Cervix:  No lesion   Skin:     General: Skin is warm and dry.  Neurological:     Mental Status: She is alert and oriented to person, place, and time.  Psychiatric:        Mood and Affect: Mood normal.    Vitals:   08/25/20 0929  BP: (!) 160/74  Pulse: 61  Resp: 16  Weight: 184 lb (83.5 kg)  Height: 5\' 2"  (1.575 m)       Assessment & Plan:  1.  Postmenopausal bleeding.  Will get transabdominal and transvaginal ultrasound.  If thickened we will get an endometrial biopsy at next visit 2.  Vagina appears a atrophic with inflammation.  App team is sent.  If estrogen is not a contraindication based on postmenopausal bleeding work-up, I would recommend vaginal estrogen. 3.  Patient will follow up with PCP about multiple loose bowel movements and flatulence. 4.  Patient has appointment with ENT today to look at tongue nodule  35 minutes spent with patient during exam, review of records, counseling, and documentation.

## 2020-08-25 NOTE — Progress Notes (Signed)
HPI: Diane Brooks is a 65 y.o. female who presents is referred by her PCP for evaluation of of a lesion on the right side of her tongue that initially developed about 2 and half months ago.  She occasionally bit it and it got sore.  But over the past month it has been getting smaller and is not causing her any problems at this point. She does not smoke or use tobacco products. She also complains of some nasal congestion at night and a dry throat at night that sometimes wake her up..  Past Medical History:  Diagnosis Date  . Diabetes mellitus without complication ([place])   . Hyperlipidemia   . Hypertension    Past Surgical History:  Procedure Laterality Date  . AUGMENTATION MAMMAPLASTY    . BREAST ENHANCEMENT SURGERY    . ESOPHAGEAL MANOMETRY N/A 07/02/2013   Procedure: ESOPHAGEAL MANOMETRY (EM);  Surgeon: Jerene Bears, MD;  Location: [facility];  Service: Gastroenterology;  Laterality: N/A;  . TONSILECTOMY/ADENOIDECTOMY WITH MYRINGOTOMY    . TUBAL LIGATION    . tummy tuck     Social History   Socioeconomic History  . Marital status: Married    Spouse name: Not on file  . Number of children: 2  . Years of education: Not on file  . Highest education level: Not on file  Occupational History  . Occupation: Retired  Tobacco Use  . Smoking status: Former Smoker    Quit date: 03/23/1991    Years since quitting: 29.4  . Smokeless tobacco: Never Used  Vaping Use  . Vaping Use: Never used  Substance and Sexual Activity  . Alcohol use: No  . Drug use: No  . Sexual activity: Yes    Birth control/protection: Surgical, Post-menopausal  Other Topics Concern  . Not on file  Social History Narrative  . Not on file   Social Determinants of Health   Financial Resource Strain: Not on file  Food Insecurity: Not on file  Transportation Needs: Not on file  Physical Activity: Not on file  Stress: Not on file  Social Connections: Not on file   Family History  Problem Relation Age of  Onset  . Hyperlipidemia Mother   . Stroke Mother   . Diabetes Father   . Hyperlipidemia Father   . Hypertension Father   . Diabetes Maternal Grandfather   . Hyperlipidemia Maternal Grandfather   . Diabetes Paternal Grandfather   . Hyperlipidemia Paternal Grandfather    Allergies  Allergen Reactions  . Metformin And Related     Nausea/GI upset  . Trulicity [Dulaglutide]     Nausea/GI upset  . Lisinopril Cough   Prior to Admission medications   Medication Sig Start Date End Date Taking? Authorizing Provider  AIMOVIG 140 MG/ML SOAJ Inject into the skin. 12/31/19   [provider]  AMBULATORY NON FORMULARY MEDICATION Freestyle Glucometer   Dx: 250.00 DM, type II 12/26/13   Breeback, [name] L, PA-C  AMBULATORY NON FORMULARY MEDICATION Freestyle test strips.   DX Type II diabetes 08/06/15   Iran Planas L, PA-C  atenolol (TENORMIN) 25 MG tablet Take 1 tablet by mouth once daily 06/02/20   Breeback, [name] L, PA-C  atorvastatin (LIPITOR) 40 MG tablet Take 1 tablet (40 mg total) by mouth daily. 12/07/19   Breeback, [name] L, PA-C  busPIRone (BUSPAR) 5 MG tablet Take 1 tablet (5 mg total) by mouth 3 (three) times daily. 05/09/20   Breeback, Royetta Car, PA-C  butalbital-acetaminophen-caffeine (FIORICET) 50-325-40 MG tablet  Take by mouth. 03/03/20   [provider]  clobetasol cream (TEMOVATE) 0.05 % Apply to hands liberally twice daily and occlude with glove for 1 hour 07/08/20   Silverio Decamp, MD  DULoxetine (CYMBALTA) 60 MG capsule Take 60 mg by mouth daily. 05/31/20   [provider]  FARXIGA 10 MG TABS tablet TAKE 1 TABLET BY MOUTH ONCE DAILY BEFORE BREAKFAST 08/21/20   Breeback, [name] L, PA-C  gabapentin (NEURONTIN) 300 MG capsule One tab PO qHS for a week, then BID for a week, then TID. May double weekly to a max of 3,600mg /day 05/21/20   Breeback, [name] L, PA-C  glucose blood (FREESTYLE LITE) test strip USE AS DIRECTED to test blood sugar once daily. DX E11.9. May sub  with patient or insurance choice 01/10/20   Iran Planas L, PA-C  hydrochlorothiazide (HYDRODIURIL) 25 MG tablet Take 1 tablet by mouth once daily 06/02/20   Breeback, [name] L, PA-C  Ibuprofen-Famotidine 800-26.6 MG TABS Take 1 tablet by mouth daily as needed.     [provider]  JANUVIA 100 MG tablet Take 1 tablet by mouth once daily 08/21/20   Breeback, [name] L, PA-C  levocetirizine (XYZAL) 5 MG tablet Take 1 tablet (5 mg total) by mouth every evening. 05/21/20   Donella Stade, PA-C  losartan (COZAAR) 50 MG tablet Take 1 tablet by mouth once daily 06/02/20   Breeback, [name] L, PA-C  montelukast (SINGULAIR) 10 MG tablet Take 10 mg by mouth at bedtime.    [provider]  pantoprazole (PROTONIX) 40 MG tablet Take 1 tablet by mouth once daily 08/21/20   Breeback, [name] L, PA-C  ReliOn Ultra Thin Lancets MISC 1 Device by Does not apply route as needed (blood sugar testing). 01/10/20   Breeback, [name] L, PA-C  terbinafine (LAMISIL) 250 MG tablet Take 250 mg by mouth daily. 08/10/20   [provider]  vitamin B-12 (CYANOCOBALAMIN) 500 MCG tablet Take 1,000 mcg by mouth daily.    [provider]  vitamin C (ASCORBIC ACID) 250 MG tablet Take 1 tablet (250 mg total) by mouth daily. 02/04/20   [name], MD     Positive ROS: Otherwise negative  All other systems have been reviewed and were otherwise negative with the exception of those mentioned in the HPI and as above.  Physical Exam: Constitutional: Alert, well-appearing, no acute distress Ears: External ears without lesions or tenderness. Ear canals are clear bilaterally with intact, clear TMs.  Nasal: External nose without lesions. Septum with minimal deformity and mild rhinitis.  Both middle meatus regions were clear with no signs of infection.  No intranasal polyps noted..  Oral: Lips and gums without lesions. Tongue and palate mucosa without lesions. Posterior oropharynx clear.  She is status post  tonsillectomy.  On examination of the tongue she has a small papilloma on the right mid lateral tongue that is soft to palpation.  It is nontender.  There is no induration or mass on palpation of the tongue. Indirect laryngoscopy revealed a clear base of tongue vallecula and epiglottis.  Vocal cords were clear bilaterally with normal vocal mobility. Neck: No palpable adenopathy or masses Respiratory: Breathing comfortably  Skin: No facial/neck lesions or rash noted.  Procedures  Assessment: Benign tongue papilloma.  No evidence of neoplasia Chronic rhinitis  Plan: For the nasal congestion at night suggested use of Flonase 2 sprays each nostril at night when she goes to bed and gave her a prescription for Flonase. Reviewed  with her that the small papilloma on the right lateral tongue is benign.  She inquired about excisional biopsy but this is not necessary unless this becomes bothersome or enlarges.  She states that it has been getting smaller over the past month. She will return if she notices any enlargement or if she has a tendency to bite it or it becomes sore.   Radene Journey, MD   CC:

## 2020-08-26 ENCOUNTER — Telehealth: Payer: Self-pay | Admitting: Neurology

## 2020-08-26 LAB — CERVICOVAGINAL ANCILLARY ONLY
Bacterial Vaginitis (gardnerella): POSITIVE — AB
Candida Glabrata: NEGATIVE
Candida Vaginitis: NEGATIVE
Comment: NEGATIVE
Comment: NEGATIVE
Comment: NEGATIVE

## 2020-08-26 NOTE — Telephone Encounter (Signed)
-----   Message from Donella Stade, [place] sent at 08/25/2020  4:56 PM EDT ----- Can we get more information on loose bowel movements and medication. Does she want to do a virtual? Dr. Gala Romney alerted [place].  ----- Message ----- From: Hali Marry, MD Sent: 08/25/2020   4:37 PM EDT To: Donella Stade, PA-C  FYI, though I don't see her on your schedule until next mo.    ----- Message ----- From: [name], MD Sent: 08/25/2020  10:12 AM EDT To: Hali Marry, MD  Hi,  [name] will be calling your office today.  She is having multiple loose bowel movements a day with flatulence.  It might be related to her new diabetes medication.  Her blood pressure was also elevated today.  Thanks,  [facility]

## 2020-08-26 NOTE — Telephone Encounter (Signed)
Patient having bowel movements every time she eats, also waking up in the night.  Appt made to discuss with Mercedes Valeriano.

## 2020-08-27 ENCOUNTER — Encounter: Payer: Self-pay | Admitting: Physician Assistant

## 2020-08-27 ENCOUNTER — Telehealth: Payer: Self-pay | Admitting: *Deleted

## 2020-08-27 ENCOUNTER — Ambulatory Visit (INDEPENDENT_AMBULATORY_CARE_PROVIDER_SITE_OTHER): Payer: 59 | Admitting: Physician Assistant

## 2020-08-27 ENCOUNTER — Other Ambulatory Visit: Payer: Self-pay

## 2020-08-27 VITALS — BP 142/59 | HR 61 | Ht 62.0 in | Wt 183.0 lb

## 2020-08-27 DIAGNOSIS — M7918 Myalgia, other site: Secondary | ICD-10-CM | POA: Diagnosis not present

## 2020-08-27 DIAGNOSIS — R140 Abdominal distension (gaseous): Secondary | ICD-10-CM

## 2020-08-27 DIAGNOSIS — K148 Other diseases of tongue: Secondary | ICD-10-CM

## 2020-08-27 DIAGNOSIS — E118 Type 2 diabetes mellitus with unspecified complications: Secondary | ICD-10-CM | POA: Diagnosis not present

## 2020-08-27 DIAGNOSIS — R195 Other fecal abnormalities: Secondary | ICD-10-CM

## 2020-08-27 DIAGNOSIS — I152 Hypertension secondary to endocrine disorders: Secondary | ICD-10-CM

## 2020-08-27 DIAGNOSIS — E1159 Type 2 diabetes mellitus with other circulatory complications: Secondary | ICD-10-CM

## 2020-08-27 DIAGNOSIS — R739 Hyperglycemia, unspecified: Secondary | ICD-10-CM

## 2020-08-27 MED ORDER — TRAMADOL HCL 50 MG PO TABS: ORAL_TABLET | ORAL | 0 refills | Status: DC

## 2020-08-27 MED ORDER — METRONIDAZOLE 500 MG PO TABS: 500.0000 mg | ORAL_TABLET | Freq: Two times a day (BID) | ORAL | 0 refills | Status: DC

## 2020-08-27 MED ORDER — OZEMPIC (0.25 OR 0.5 MG/DOSE) 2 MG/1.5ML ~~LOC~~ SOPN: 0.2500 mg | PEN_INJECTOR | SUBCUTANEOUS | 2 refills | Status: DC

## 2020-08-27 MED ORDER — LOSARTAN POTASSIUM 100 MG PO TABS
100.0000 mg | ORAL_TABLET | Freq: Every day | ORAL | 0 refills | Status: DC
Start: 2020-08-27 — End: 2020-11-06

## 2020-08-27 NOTE — Progress Notes (Signed)
Subjective:    Patient ID: Diane Brooks, female    DOB: [birth year], 65 y.o.   MRN: [id number]  HPI  Pt is a 65 yo obese female with T2DM, HTN, HLD, myofasical pain syndrome who presents to the clinic to follow up on blood pressure and medications.   She was seen by Dr. Gala Romney, GYN, and BP 160s over 70s. No CP, palpitations, headaches or vision changes. Pt is not checking at home. She also had concerns about loose frequent stools.   Pt is on [place] for DM control. She had been doing good but recently noticed sugars 140's in morning. She is concerned. She is also having frequent bowel movements that are loose. She has been urinating more and even had an accident at night the other week.   She contines to have pain on the left side of her body. She aches "so many places". Taking cymbalta. Not taking gabapentin. Taking tylenol but wants something stronger.   Very anxious about tongue papilloma. Wants opinion on removal.   .. Active Ambulatory Problems    Diagnosis Date Noted  . Hyperlipidemia 03/28/2013  . Hiatal hernia 03/28/2013  . Essential hypertension, benign 03/28/2013  . Diabetes mellitus type II, controlled ([place]) 03/28/2013  . Gastroesophageal reflux disease 05/10/2013  . Globus sensation 05/10/2013  . Atypical chest pain 05/10/2013  . Internal hemorrhoids 05/10/2013  . Decreased libido 05/10/2013  . Dysplasia of cervix, low grade (CIN 1) 05/24/2013  . Colon cancer screening 05/25/2013  . Obesity (BMI 30-39.9) 06/27/2013  . Diabetes mellitus with microalbuminuria ([place]) 06/27/2013  . Hyperlipemia 06/27/2013  . Cyst, eyelid sebaceous 06/27/2013  . Onychomycosis 06/27/2013  . 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 Sudoriferous cyst 07/17/2013  . Seasonal allergies 08/03/2013  . Allergic rhinitis 08/03/2013  . Tinea versicolor 08/03/2013  . Obesity, unspecified 10/29/2013  . Abdominal pain, epigastric 11/09/2013  . Migraine headache without aura 10/16/2015  . De  Quervain's disease (radial styloid tenosynovitis) 11/14/2015  . BPV (benign positional vertigo) 11/14/2015  . Polyarthralgia 11/19/2015  . Mouth sores 12/15/2015  . Black stools 12/15/2015  . Light stools 03/03/2016  . Systolic murmur 81/85/6314  . Low serum vitamin B12 04/20/2017  . Memory changes 04/24/2017  . Itching 06/13/2017  . Anxiety 06/13/2017  . Lipoma of left lower extremity 06/13/2017  . Reactive airway disease 07/03/2017  . Shortness of breath 07/07/2017  . Myofascial pain syndrome 07/14/2017  . Vitamin D deficiency 09/12/2017  . Microalbuminuria 09/13/2017  . Hypertension associated with diabetes ([place]) 03/07/2018  . Blepharospasm 06/13/2018  . Anxiety as acute reaction to exceptional stress 09/07/2018  . Facet arthritis, degenerative, lumbar spine 04/16/2019  . Spondylosis without myelopathy or radiculopathy, cervical region 04/16/2019  . Left lumbar radiculitis 04/27/2019  . Radiculitis of left cervical region 04/27/2019  . Urinary incontinence 05/18/2019  . Protrusion of lumbar intervertebral disc 05/21/2019  . DDD (degenerative disc disease), lumbar 05/21/2019  . Trochanteric bursitis, left hip 05/29/2019  . Hyperlipidemia associated with type 2 diabetes mellitus ([place]) 12/11/2019  . Acute midline thoracic back pain 12/11/2019  . Chronic pain 01/16/2020  . Moderate episode of recurrent major depressive disorder ([place]) 01/22/2020  . Neuropathy 03/31/2020  . Sinus pressure 05/26/2020  . Tongue lesion 06/29/2020  . Dyshidrotic eczema 07/07/2020  . Bloating 08/27/2020  . Loose stools 08/27/2020   Resolved Ambulatory Problems    Diagnosis Date Noted  . Abnormal weight gain 06/27/2013  . Hot flashes 10/29/2013  . Mid back pain 10/24/2015  .  Left-sided low back pain with left-sided sciatica 10/24/2015  . Neck pain 10/24/2015  . Muscle spasm of back 11/14/2015  . Persistent cough for 3 weeks or longer 07/03/2017  . Visit for screening mammogram 09/12/2017  .  Chronic left-sided low back pain without sciatica 03/09/2018  . DDD (degenerative disc disease), cervical 04/16/2019   Past Medical History:  Diagnosis Date  . Diabetes mellitus without complication ([place])   . Hypertension      Review of Systems     Objective:   Physical Exam Vitals reviewed.  Constitutional:      Appearance: Normal appearance. She is obese.  HENT:     Head: Normocephalic.  Cardiovascular:     Rate and Rhythm: Normal rate.  Pulmonary:     Effort: Pulmonary effort is normal.  Neurological:     General: No focal deficit present.     Mental Status: She is alert and oriented to person, place, and time.  Psychiatric:        Mood and Affect: Mood normal.           Assessment & Plan:  [name] KitchenMarland KitchenEthylene was seen today for gi problem.  Diagnoses and all orders for this visit:  Loose stools  Bloating  Controlled type 2 diabetes mellitus with complication, without long-term current use of insulin (HCC) -     Semaglutide,0.25 or 0.5MG /DOS, (OZEMPIC, 0.25 OR 0.5 MG/DOSE,) 2 MG/1.5ML SOPN; Inject 0.25 mg into the skin once a week.  Myofascial pain syndrome -     traMADol (ULTRAM) 50 MG tablet; Take 1 tablet as needed every 6 hours.  Tongue lesion  Hypertension associated with diabetes ([place]) -     losartan (COZAAR) 100 MG tablet; Take 1 tablet (100 mg total) by mouth daily.  Hyperglycemia -     Semaglutide,0.25 or 0.5MG /DOS, (OZEMPIC, 0.25 OR 0.5 MG/DOSE,) 2 MG/1.5ML SOPN; Inject 0.25 mg into the skin once a week.    I do think loose stools and frequent urination coming from SGLT-2. Stop Faraxiga.  Pt really wants to lose weight and start wegovy. Discussed ozempic is the same but she did not tolerate trulicity.  ozempic .25mg  weekly sent to pharmacy to start after seeing if stools improve off SGLT-2.   Fibromyalgia and Chronic pain-  Tramadol for break through pain.  [name] Kitchen.PDMP reviewed during this encounter.  NO NSAIDS due to gastritis.  Start gabapentin.   Continue cymbalta.   BP is not to goal. Better than when at GYN. Increased losaartan to 100mg  daily. Goal BP under 130/80.   Discussed with patient the likely benign nature of tongue lesion but it is causing patient great anxiety. I think in her best interest to get it removed. Ok to make appt with ENT.

## 2020-08-27 NOTE — Patient Instructions (Addendum)
Goal BP under 130/80.  Increase losaartan 100mg  for BP.  Stop faraxiga and let me know 2ish weeks.  Stay on januvia 100mg  daily.   Tramadol as needed for break through pain.   Start gabapentin slowly up to three time a day.   Do not start ozempic until we figure out if farxiga is causing loose stools.

## 2020-08-27 NOTE — Telephone Encounter (Signed)
My cahrt message sent to pt informing her of positive BV and RX was sent to [facility] in [place] per Dr Gala Romney.

## 2020-08-29 ENCOUNTER — Other Ambulatory Visit: Payer: 59

## 2020-09-01 ENCOUNTER — Other Ambulatory Visit: Payer: Self-pay

## 2020-09-01 ENCOUNTER — Ambulatory Visit (INDEPENDENT_AMBULATORY_CARE_PROVIDER_SITE_OTHER): Payer: 59

## 2020-09-01 DIAGNOSIS — R102 Pelvic and perineal pain: Secondary | ICD-10-CM

## 2020-09-08 ENCOUNTER — Other Ambulatory Visit: Payer: Self-pay

## 2020-09-08 ENCOUNTER — Encounter: Payer: Self-pay | Admitting: Obstetrics & Gynecology

## 2020-09-08 ENCOUNTER — Ambulatory Visit (INDEPENDENT_AMBULATORY_CARE_PROVIDER_SITE_OTHER): Payer: 59 | Admitting: Obstetrics & Gynecology

## 2020-09-08 VITALS — BP 114/71 | HR 72 | Resp 16 | Ht 62.0 in | Wt 182.0 lb

## 2020-09-08 DIAGNOSIS — N950 Postmenopausal bleeding: Secondary | ICD-10-CM | POA: Diagnosis not present

## 2020-09-08 NOTE — Progress Notes (Signed)
   Subjective:    Patient ID: Diane Brooks, female    DOB: 24-Jan-1956, 65 y.o.   MRN: [id number]  HPI  Pt here for follow up vaginal bleeding and discharge.  Pt had BV and treated with Flagyl.  Pt had no more vaginal bleeding.  [place] as below and lining is thin.  Pt had 3 BMs a day and urinary issues.  These complaints have also resolved.    CLINICAL DATA:  Pelvic pain for 3 weeks intermittently, history of tubal ligation. Postmenopausal   EXAM: TRANSABDOMINAL AND TRANSVAGINAL ULTRASOUND OF PELVIS   TECHNIQUE: Both transabdominal and transvaginal ultrasound examinations of the pelvis were performed. Transabdominal technique was performed for global imaging of the pelvis including uterus, ovaries, adnexal regions, and pelvic cul-de-sac. It was necessary to proceed with endovaginal exam following the transabdominal exam to visualize the endometrium and LEFT ovary.   COMPARISON:  12/09/2016   FINDINGS: Uterus   Measurements: 11.0 x 3.9 x 8.4 cm = volume: 185 mL. Anteverted. Multiple masses likely representing leiomyomata. These include: 2.7 cm fundal intramural leiomyoma, 3.5 cm anterior intramural leiomyoma potentially extending submucosal, exophytic 5.4 cm diameter lower uterine leiomyoma to RIGHT, and exophytic 4.5 cm diameter pedunculated leiomyoma to LEFT.   Endometrium   Thickness: 2 mm.  No endometrial fluid.   Right ovary   Measurements: 2.8 x 1.7 x 1.9 cm = volume: 5 mL. Normal morphology without mass   Left ovary   Not visualized, likely obscured by bowel   Other findings   Trace free pelvic fluid.  No adnexal masses.   IMPRESSION: Multiple uterine leiomyomata up to 5.4 cm diameter, several of which are exophytic and a single of which potentially extends submucosal.   Unremarkable endometrial complex and RIGHT ovary with nonvisualization of LEFT ovary.     Electronically Signed   By: Lavonia Dana M.D.   On: 09/01/2020 16:50 Review of Systems   Constitutional: Negative.   Respiratory: Negative.    Cardiovascular: Negative.   Gastrointestinal: Negative.   Genitourinary: Negative.       Objective:   Physical Exam Vitals reviewed.  Constitutional:      General: She is not in acute distress.    Appearance: She is well-developed.  HENT:     Head: Normocephalic and atraumatic.  Eyes:     Conjunctiva/sclera: Conjunctivae normal.  Cardiovascular:     Rate and Rhythm: Normal rate.  Pulmonary:     Effort: Pulmonary effort is normal.  Skin:    General: Skin is warm and dry.  Neurological:     Mental Status: She is alert and oriented to person, place, and time.  Psychiatric:        Mood and Affect: Mood normal.  Vitals:   09/08/20 1242  BP: 114/71  Pulse: 72  Resp: 16  Weight: 182 lb (82.6 kg)  Height: 5\' 2"  (1.575 m)      Assessment & Plan:  65 yo female w/  Symptoms resolved; treated for BV Endometrial stripe thin and biopsy can be avoided.  If bleeding continues will biopsy.   Pap in October Mammogram in October  24 minutes spent with patient, review of records, counseling and documentaion

## 2020-09-24 ENCOUNTER — Ambulatory Visit (INDEPENDENT_AMBULATORY_CARE_PROVIDER_SITE_OTHER): Payer: 59 | Admitting: Physician Assistant

## 2020-09-24 ENCOUNTER — Other Ambulatory Visit: Payer: Self-pay

## 2020-09-24 VITALS — BP 120/62 | HR 68 | Ht 62.0 in | Wt 182.0 lb

## 2020-09-24 DIAGNOSIS — Z6833 Body mass index (BMI) 33.0-33.9, adult: Secondary | ICD-10-CM | POA: Diagnosis not present

## 2020-09-24 DIAGNOSIS — E118 Type 2 diabetes mellitus with unspecified complications: Secondary | ICD-10-CM

## 2020-09-24 DIAGNOSIS — E6609 Other obesity due to excess calories: Secondary | ICD-10-CM | POA: Diagnosis not present

## 2020-09-24 LAB — POCT GLYCOSYLATED HEMOGLOBIN (HGB A1C): Hemoglobin A1C: 6.7 % — AB (ref 4.0–5.6)

## 2020-09-24 NOTE — Patient Instructions (Addendum)
Start ozempic and stay on farxiga.  Stop januiva.   Semaglutide injection solution What is this medication? SEMAGLUTIDE (Sem a GLOO tide) is used to improve blood sugar control in adults with type 2 diabetes. This medicine may be used with other diabetes medicines. This drug may also reduce the risk of heart attack or stroke if you have type 2diabetes and risk factors for heart disease. This medicine may be used for other purposes; ask your health care provider orpharmacist if you have questions. COMMON BRAND NAME(S): OZEMPIC What should I tell my care team before I take this medication? They need to know if you have any of these conditions: endocrine tumors (MEN 2) or if someone in your family had these tumors eye disease, vision problems history of pancreatitis kidney disease stomach problems thyroid cancer or if someone in your family had thyroid cancer an unusual or allergic reaction to semaglutide, other medicines, foods, dyes, or preservatives pregnant or trying to get pregnant breast-feeding How should I use this medication? This medicine is for injection under the skin of your upper leg (thigh), stomach area, or upper arm. It is given once every week (every 7 days). You will be taught how to prepare and give this medicine. Use exactly as directed. Take your medicine at regular intervals. Do not take it more often thandirected. If you use this medicine with insulin, you should inject this medicine and the insulin separately. Do not mix them together. Do not give the injections rightnext to each other. Change (rotate) injection sites with each injection. It is important that you put your used needles and syringes in a special sharps container. Do not put them in a trash can. If you do not have a sharpscontainer, call your pharmacist or healthcare provider to get one. A special MedGuide will be given to you by the pharmacist with eachprescription and refill. Be sure to read this  information carefully each time. This drug comes with INSTRUCTIONS FOR USE. Ask your pharmacist for directions on how to use this drug. Read the information carefully. Talk to yourpharmacist or health care provider if you have questions. Talk to your pediatrician regarding the use of this medicine in children.Special care may be needed. Overdosage: If you think you have taken too much of this medicine contact apoison control center or emergency room at once. NOTE: This medicine is only for you. Do not share this medicine with others. What if I miss a dose? If you miss a dose, take it as soon as you can within 5 days after the missed dose. Then take your next dose at your regular weekly time. If it has been longer than 5 days after the missed dose, do not take the missed dose. Take the next dose at your regular time. Do not take double or extra doses. If you havequestions about a missed dose, contact your health care provider for advice. What may interact with this medication? other medicines for diabetes Many medications may cause changes in blood sugar, these include: alcohol containing beverages antiviral medicines for HIV or AIDS aspirin and aspirin-like drugs certain medicines for blood pressure, heart disease, irregular heart beat chromium diuretics female hormones, such as estrogens or progestins, birth control pills fenofibrate gemfibrozil isoniazid lanreotide female hormones or anabolic steroids MAOIs like Carbex, Eldepryl, Marplan, Nardil, and Parnate medicines for weight loss medicines for allergies, asthma, cold, or cough medicines for depression, anxiety, or psychotic disturbances niacin nicotine NSAIDs, medicines for pain and inflammation, like ibuprofen or naproxen octreotide  pasireotide pentamidine phenytoin probenecid quinolone antibiotics such as ciprofloxacin, levofloxacin, ofloxacin some herbal dietary supplements steroid medicines such as prednisone or  cortisone sulfamethoxazole; trimethoprim thyroid hormones Some medications can hide the warning symptoms of low blood sugar (hypoglycemia). You may need to monitor your blood sugar more closely if youare taking one of these medications. These include: beta-blockers, often used for high blood pressure or heart problems (examples include atenolol, metoprolol, propranolol) clonidine guanethidine reserpine This list may not describe all possible interactions. Give your health care provider a list of all the medicines, herbs, non-prescription drugs, or dietary supplements you use. Also tell them if you smoke, drink alcohol, or use illegaldrugs. Some items may interact with your medicine. What should I watch for while using this medication? Visit your doctor or health care professional for regular checks on yourprogress. Drink plenty of fluids while taking this medicine. Check with your doctor or health care professional if you get an attack of severe diarrhea, nausea, and vomiting. The loss of too much body fluid can make it dangerous for you to takethis medicine. A test called the HbA1C (A1C) will be monitored. This is a simple blood test. It measures your blood sugar control over the last 2 to 3 months. You willreceive this test every 3 to 6 months. Learn how to check your blood sugar. Learn the symptoms of low and high bloodsugar and how to manage them. Always carry a quick-source of sugar with you in case you have symptoms of low blood sugar. Examples include hard sugar candy or glucose tablets. Make sure others know that you can choke if you eat or drink when you develop serious symptoms of low blood sugar, such as seizures or unconsciousness. They must getmedical help at once. Tell your doctor or health care professional if you have high blood sugar. You might need to change the dose of your medicine. If you are sick or exercisingmore than usual, you might need to change the dose of your  medicine. Do not skip meals. Ask your doctor or health care professional if you should avoid alcohol. Many nonprescription cough and cold products contain sugar oralcohol. These can affect blood sugar. Pens should never be shared. Even if the needle is changed, sharing may resultin passing of viruses like hepatitis or HIV. Wear a medical ID bracelet or chain, and carry a card that describes yourdisease and details of your medicine and dosage times. Do not become pregnant while taking this medicine. Women should inform their doctor if they wish to become pregnant or think they might be pregnant. There is a potential for serious side effects to an unborn child. Talk to your healthcare professional or pharmacist for more information. What side effects may I notice from receiving this medication? Side effects that you should report to your doctor or health care professionalas soon as possible: allergic reactions like skin rash, itching or hives, swelling of the face, lips, or tongue breathing problems changes in vision diarrhea that continues or is severe lump or swelling on the neck severe nausea signs and symptoms of infection like fever or chills; cough; sore throat; pain or trouble passing urine signs and symptoms of low blood sugar such as feeling anxious, confusion, dizziness, increased hunger, unusually weak or tired, sweating, shakiness, cold, irritable, headache, blurred vision, fast heartbeat, loss of consciousness signs and symptoms of kidney injury like trouble passing urine or change in the amount of urine trouble swallowing unusual stomach upset or pain vomiting Side effects that usually do  not require medical attention (report to yourdoctor or health care professional if they continue or are bothersome): constipation diarrhea nausea pain, redness, or irritation at site where injected stomach upset This list may not describe all possible side effects. Call your doctor for medical  advice about side effects. You may report side effects to FDA at1-800-FDA-1088. Where should I keep my medication? Keep out of the reach of children. Store unopened pens in a refrigerator between 2 and 8 degrees C (36 and 46 degrees F). Do not freeze. Protect from light and heat. After you first use the pen, it can be stored for 56 days at room temperature between 15 and 30 degrees C (59 and 86 degrees F) or in a refrigerator. Throw away your used pen after 56days or after the expiration date, whichever comes first. Do not store your pen with the needle attached. If the needle is left on,medicine may leak from the pen. NOTE: This sheet is a summary. It may not cover all possible information. If you have questions about this medicine, talk to your doctor, pharmacist, orhealth care provider.  2022 Elsevier/Gold Standard (2018-11-21 09:41:51)

## 2020-09-24 NOTE — Progress Notes (Signed)
Subjective:    Patient ID: Diane Brooks, female    DOB: 1956/01/30, 65 y.o.   MRN: [id number]  HPI Pt is a 65 yo female with T2DM, HTN, HLD who presents to the clinic for 1 month follow up on diabetes medication changes.   She was supposed to start ozempic but she got the shot and got confused. She thought she would have to get the shot refilled weekly and would cost too much. Never started. On januvia and farxiga. Checking sugars and seem to be 100-150s in the morning. Tolerating well. Diarrhea resolved after taking flagyl. No open sores or wounds. No hypoglycemia. Denies any CP, palpitations, headaches or vision changes.   Pt wants to lose weight.     .. Active Ambulatory Problems    Diagnosis Date Noted   Hyperlipidemia 03/28/2013   Hiatal hernia 03/28/2013   Essential hypertension, benign 03/28/2013   Diabetes mellitus type II, controlled ([place]) 03/28/2013   Gastroesophageal reflux disease 05/10/2013   Globus sensation 05/10/2013   Atypical chest pain 05/10/2013   Internal hemorrhoids 05/10/2013   Decreased libido 05/10/2013   Dysplasia of cervix, low grade (CIN 1) 05/24/2013   Colon cancer screening 05/25/2013   Obesity (BMI 30-39.9) 06/27/2013   Diabetes mellitus with microalbuminuria ([place]) 06/27/2013   Hyperlipemia 06/27/2013   Cyst, eyelid sebaceous 06/27/2013   Onychomycosis 06/27/2013   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   Sudoriferous cyst 07/17/2013   Seasonal allergies 08/03/2013   Allergic rhinitis 08/03/2013   Tinea versicolor 08/03/2013   Obesity, unspecified 10/29/2013   Abdominal pain, epigastric 11/09/2013   Migraine headache without aura 10/16/2015   De Quervain's disease (radial styloid tenosynovitis) 11/14/2015   BPV (benign positional vertigo) 11/14/2015   Polyarthralgia 11/19/2015   Mouth sores 12/15/2015   Black stools 12/15/2015   Light stools 97/35/3299   Systolic murmur 24/26/8341   Low serum vitamin B12 04/20/2017   Memory changes 04/24/2017    Itching 06/13/2017   Anxiety 06/13/2017   Lipoma of left lower extremity 06/13/2017   Reactive airway disease 07/03/2017   Shortness of breath 07/07/2017   Myofascial pain syndrome 07/14/2017   Vitamin D deficiency 09/12/2017   Microalbuminuria 09/13/2017   Hypertension associated with diabetes ([place]) 03/07/2018   Blepharospasm 06/13/2018   Anxiety as acute reaction to exceptional stress 09/07/2018   Facet arthritis, degenerative, lumbar spine 04/16/2019   Spondylosis without myelopathy or radiculopathy, cervical region 04/16/2019   Left lumbar radiculitis 04/27/2019   Radiculitis of left cervical region 04/27/2019   Urinary incontinence 05/18/2019   Protrusion of lumbar intervertebral disc 05/21/2019   DDD (degenerative disc disease), lumbar 05/21/2019   Trochanteric bursitis, left hip 05/29/2019   Hyperlipidemia associated with type 2 diabetes mellitus ([place]) 12/11/2019   Acute midline thoracic back pain 12/11/2019   Chronic pain 01/16/2020   Moderate episode of recurrent major depressive disorder ([place]) 01/22/2020   Neuropathy 03/31/2020   Sinus pressure 05/26/2020   Tongue lesion 06/29/2020   Dyshidrotic eczema 07/07/2020   Bloating 08/27/2020   Loose stools 08/27/2020   Resolved Ambulatory Problems    Diagnosis Date Noted   Abnormal weight gain 06/27/2013   Hot flashes 10/29/2013   Mid back pain 10/24/2015   Left-sided low back pain with left-sided sciatica 10/24/2015   Neck pain 10/24/2015   Muscle spasm of back 11/14/2015   Persistent cough for 3 weeks or longer 07/03/2017   Visit for screening mammogram 09/12/2017   Chronic left-sided low back pain without sciatica 03/09/2018   DDD (degenerative disc  disease), cervical 04/16/2019   Past Medical History:  Diagnosis Date   Diabetes mellitus without complication ([place])    Hypertension     Review of Systems  All other systems reviewed and are negative.     Objective:   Physical Exam Vitals reviewed.   Constitutional:      Appearance: Normal appearance. She is obese.  HENT:     Head: Normocephalic and atraumatic.  Cardiovascular:     Rate and Rhythm: Normal rate and regular rhythm.     Heart sounds: Murmur heard.  Pulmonary:     Effort: Pulmonary effort is normal.     Breath sounds: Normal breath sounds.  Musculoskeletal:     Right lower leg: No edema.     Left lower leg: No edema.  Neurological:     General: No focal deficit present.     Mental Status: She is alert and oriented to person, place, and time.  Psychiatric:        Mood and Affect: Mood normal.     .. Results for orders placed or performed in visit on 09/24/20  POCT glycosylated hemoglobin (Hb A1C)  Result Value Ref Range   Hemoglobin A1C 6.7 (A) 4.0 - 5.6 %   HbA1c POC (<> result, manual entry)     HbA1c, POC (prediabetic range)     HbA1c, POC (controlled diabetic range)          Assessment & Plan:  Diane Brooks KitchenMarland KitchenDekota was seen today for diabetes.  Diagnoses and all orders for this visit:  Controlled type 2 diabetes mellitus with complication, without long-term current use of insulin (HCC) -     POCT glycosylated hemoglobin (Hb A1C)  Class 1 obesity due to excess calories with serious comorbidity and body mass index (BMI) of 33.0 to 33.9 in adult   A1C is up from 6.2 to 6.7 Discussed ozempic and how multiple shots are in one container. Can keep out for 56 days.  If tolerating consider increasing.  Stop [place].  Start ozempic and farxiga.  Follow up in 3 months.   Diane Brooks Kitchen.Discussed low carb diet with 1500 calories and 80g of protein.  Exercising at least 150 minutes a week.  [employer] could be a Microbiologist.  Ozempic should help with weight loss as well.

## 2020-09-26 ENCOUNTER — Encounter: Payer: Self-pay | Admitting: Physician Assistant

## 2020-10-14 ENCOUNTER — Other Ambulatory Visit: Payer: Self-pay

## 2020-10-14 ENCOUNTER — Ambulatory Visit (INDEPENDENT_AMBULATORY_CARE_PROVIDER_SITE_OTHER): Payer: 59 | Admitting: Physician Assistant

## 2020-10-14 VITALS — BP 133/71 | HR 78 | Ht 62.0 in | Wt 182.0 lb

## 2020-10-14 DIAGNOSIS — T887XXA Unspecified adverse effect of drug or medicament, initial encounter: Secondary | ICD-10-CM | POA: Diagnosis not present

## 2020-10-14 DIAGNOSIS — E1142 Type 2 diabetes mellitus with diabetic polyneuropathy: Secondary | ICD-10-CM | POA: Diagnosis not present

## 2020-10-14 MED ORDER — GLIPIZIDE ER 2.5 MG PO TB24: 2.5000 mg | ORAL_TABLET | Freq: Every day | ORAL | 2 refills | Status: DC

## 2020-10-14 MED ORDER — SITAGLIPTIN PHOSPHATE 100 MG PO TABS: 100.0000 mg | ORAL_TABLET | Freq: Every day | ORAL | 2 refills | Status: DC

## 2020-10-14 NOTE — Patient Instructions (Signed)
Stop ozempic.  Continue faraxiga.  Add back [place].  Add glipizide.

## 2020-10-14 NOTE — Progress Notes (Signed)
Subjective:    Patient ID: Diane Brooks, female    DOB: [birth year], 65 y.o.   MRN: [id number]  HPI Patient is a 65 year old obese female who presents to the clinic to follow-up on Ozempic for diabetes.  She did take 1 dose and thought she did not get it into her body so she took another 1.  She later realized she was doing it right.  So she took 0.5 instead of 0.25 on the first dose.  She did not like the way she felt for a few days.  She had increased reflux and burning in her chest.  Does seem to be getting better.  She wonders if she should stop.  Her sugars have not been getting better. They have been trending in the mornings around 130-140. She "has never been like this before".  .. Active Ambulatory Problems    Diagnosis Date Noted   Hyperlipidemia 03/28/2013   Hiatal hernia 03/28/2013   Essential hypertension, benign 03/28/2013   Diabetes mellitus type II, controlled ([place]) 03/28/2013   Gastroesophageal reflux disease 05/10/2013   Globus sensation 05/10/2013   Atypical chest pain 05/10/2013   Internal hemorrhoids 05/10/2013   Decreased libido 05/10/2013   Dysplasia of cervix, low grade (CIN 1) 05/24/2013   Colon cancer screening 05/25/2013   Obesity (BMI 30-39.9) 06/27/2013   Diabetes mellitus with microalbuminuria ([place]) 06/27/2013   Hyperlipemia 06/27/2013   Cyst, eyelid sebaceous 06/27/2013   Onychomycosis 06/27/2013   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   Sudoriferous cyst 07/17/2013   Seasonal allergies 08/03/2013   Allergic rhinitis 08/03/2013   Tinea versicolor 08/03/2013   Class 1 obesity due to excess calories with serious comorbidity and body mass index (BMI) of 33.0 to 33.9 in adult 10/29/2013   Abdominal pain, epigastric 11/09/2013   Migraine headache without aura 10/16/2015   De Quervain's disease (radial styloid tenosynovitis) 11/14/2015   BPV (benign positional vertigo) 11/14/2015   Polyarthralgia 11/19/2015   Mouth sores 12/15/2015   Black stools 12/15/2015    Light stools [id number]   Systolic murmur [id number]   Low serum vitamin B12 04/20/2017   Memory changes 04/24/2017   Itching 06/13/2017   Anxiety 06/13/2017   Lipoma of left lower extremity 06/13/2017   Reactive airway disease 07/03/2017   Shortness of breath 07/07/2017   Myofascial pain syndrome 07/14/2017   Vitamin D deficiency 09/12/2017   Microalbuminuria 09/13/2017   Hypertension associated with diabetes ([place]) 03/07/2018   Blepharospasm 06/13/2018   Anxiety as acute reaction to exceptional stress 09/07/2018   Facet arthritis, degenerative, lumbar spine 04/16/2019   Spondylosis without myelopathy or radiculopathy, cervical region 04/16/2019   Left lumbar radiculitis 04/27/2019   Radiculitis of left cervical region 04/27/2019   Urinary incontinence 05/18/2019   Protrusion of lumbar intervertebral disc 05/21/2019   DDD (degenerative disc disease), lumbar 05/21/2019   Trochanteric bursitis, left hip 05/29/2019   Hyperlipidemia associated with type 2 diabetes mellitus ([place]) 12/11/2019   Acute midline thoracic back pain 12/11/2019   Chronic pain 01/16/2020   Moderate episode of recurrent major depressive disorder ([place]) 01/22/2020   Neuropathy 03/31/2020   Sinus pressure 05/26/2020   Tongue lesion 06/29/2020   Dyshidrotic eczema 07/07/2020   Bloating 08/27/2020   Loose stools 08/27/2020   Medication side effect 10/24/2020   Resolved Ambulatory Problems    Diagnosis Date Noted   Abnormal weight gain 06/27/2013   Hot flashes 10/29/2013   Mid back pain 10/24/2015   Left-sided low back pain with left-sided sciatica 10/24/2015   Neck  pain 10/24/2015   Muscle spasm of back 11/14/2015   Persistent cough for 3 weeks or longer 07/03/2017   Visit for screening mammogram 09/12/2017   Chronic left-sided low back pain without sciatica 03/09/2018   DDD (degenerative disc disease), cervical 04/16/2019   Past Medical History:  Diagnosis Date   Diabetes mellitus without complication  ([place])    Hypertension      Review of Systems See HPI.     Objective:   Physical Exam Vitals reviewed.  Constitutional:      Appearance: Normal appearance. She is obese.  HENT:     Head: Normocephalic.  Cardiovascular:     Rate and Rhythm: Normal rate and regular rhythm.     Heart sounds: Murmur heard.  Pulmonary:     Effort: Pulmonary effort is normal.  Abdominal:     General: There is no distension.     Palpations: There is no mass.     Tenderness: There is no abdominal tenderness. There is no right CVA tenderness, left CVA tenderness, guarding or rebound.  Neurological:     General: No focal deficit present.     Mental Status: She is alert and oriented to person, place, and time.  Psychiatric:        Mood and Affect: Mood normal.      .. Lab Results  Component Value Date   HGBA1C 6.7 (A) 09/24/2020       Assessment & Plan:  [name] KitchenMarland KitchenRakelle was seen today for follow-up.  Diagnoses and all orders for this visit:  Medication side effect  Controlled type 2 diabetes mellitus with diabetic polyneuropathy, without long-term current use of insulin (HCC) -     glipiZIDE (GLUCOTROL XL) 2.5 MG 24 hr tablet; Take 1 tablet (2.5 mg total) by mouth daily with breakfast. -     sitaGLIPtin (JANUVIA) 100 MG tablet; Take 1 tablet (100 mg total) by mouth daily.   It looks like patient cannot tolerate GLP-1. She did only try it once and gave herself double the injection she could try it again for one week or STOP ozempic. Continue faraxiga, add back [place] and add glipizide. Pt sugars are trending up in the 140 and close to 7.  Too soon for A1C follow up in 2 months.   Spent 30 minutes with patient discussing medication/side effects/diabetes control and treatment plan.

## 2020-10-24 ENCOUNTER — Encounter: Payer: Self-pay | Admitting: Physician Assistant

## 2020-10-24 DIAGNOSIS — T887XXA Unspecified adverse effect of drug or medicament, initial encounter: Secondary | ICD-10-CM | POA: Insufficient documentation

## 2020-10-27 ENCOUNTER — Other Ambulatory Visit: Payer: Self-pay | Admitting: Neurology

## 2020-10-30 ENCOUNTER — Ambulatory Visit (INDEPENDENT_AMBULATORY_CARE_PROVIDER_SITE_OTHER): Payer: 59 | Admitting: Family Medicine

## 2020-10-30 ENCOUNTER — Encounter: Payer: Self-pay | Admitting: Family Medicine

## 2020-10-30 VITALS — BP 126/71 | HR 59 | Temp 97.9°F | Resp 17

## 2020-10-30 DIAGNOSIS — K0889 Other specified disorders of teeth and supporting structures: Secondary | ICD-10-CM

## 2020-10-30 MED ORDER — TRAMADOL HCL 50 MG PO TABS: 50.0000 mg | ORAL_TABLET | Freq: Three times a day (TID) | ORAL | 0 refills | Status: AC | PRN

## 2020-10-30 NOTE — Progress Notes (Signed)
Acute Office Visit  Subjective:    Patient ID: Diane Brooks, female    DOB: Aug 20, 1955, 65 y.o.   MRN: [id number]  Chief Complaint  Patient presents with   Dental Pain    HPI Patient is in today for dental pain.  Patient started having right lower jaw pain last Wednesday. She started taking tylenol and doing salt water rinses. States the pain continued to worse and she was able to get in with a local dentist on Monday who took x-rays and said that she had a jaw infection. He started her on ibuprofen and clindamycin. Reports it may be helping a little bit, but not much improvement yet. Pain is still 6/10 throbbing, and tends to be worse and night when trying to sleep. States the pain is mostly over right lower jaw but radiates into cheek and towards ear. She states he dentist was supposed to fax of some information yesterday because he was wanting to get some blood work to see what type of infection she had in case he needed to change antibiotics.   She denies any dental abscesses, drainage, fevers, tachycardia, other pains, rashes, chest pain, dyspnea.     Past Medical History:  Diagnosis Date   Diabetes mellitus without complication ([place])    Hyperlipidemia    Hypertension     Past Surgical History:  Procedure Laterality Date   AUGMENTATION MAMMAPLASTY     BREAST ENHANCEMENT SURGERY     ESOPHAGEAL MANOMETRY N/A 07/02/2013   Procedure: ESOPHAGEAL MANOMETRY (EM);  Surgeon: Jerene Bears, MD;  Location: [facility];  Service: Gastroenterology;  Laterality: N/A;   TONSILECTOMY/ADENOIDECTOMY WITH MYRINGOTOMY     TUBAL LIGATION     tummy tuck      Family History  Problem Relation Age of Onset   Hyperlipidemia Mother    Stroke Mother    Diabetes Father    Hyperlipidemia Father    Hypertension Father    Diabetes Maternal Grandfather    Hyperlipidemia Maternal Grandfather    Diabetes Paternal Grandfather    Hyperlipidemia Paternal Grandfather     Social History    Socioeconomic History   Marital status: Married    Spouse name: Not on file   Number of children: 2   Years of education: Not on file   Highest education level: Not on file  Occupational History   Occupation: Retired  Tobacco Use   Smoking status: Former    Types: Cigarettes    Quit date: 03/23/1991    Years since quitting: 29.6   Smokeless tobacco: Never  Vaping Use   Vaping Use: Never used  Substance and Sexual Activity   Alcohol use: No   Drug use: No   Sexual activity: Yes    Birth control/protection: Surgical, Post-menopausal  Other Topics Concern   Not on file  Social History Narrative   Not on file   Social Determinants of Health   Financial Resource Strain: Not on file  Food Insecurity: Not on file  Transportation Needs: Not on file  Physical Activity: Not on file  Stress: Not on file  Social Connections: Not on file  Intimate Partner Violence: Not on file    Outpatient Medications Prior to Visit  Medication Sig Dispense Refill   AIMOVIG 140 MG/ML SOAJ Inject into the skin.     AMBULATORY NON FORMULARY MEDICATION Freestyle Glucometer   Dx: 250.00 DM, type II 1 Device 0   AMBULATORY NON FORMULARY MEDICATION Freestyle test strips.   DX Type II  diabetes 100 strip 1   atenolol (TENORMIN) 25 MG tablet Take 1 tablet by mouth once daily 90 tablet 1   atorvastatin (LIPITOR) 40 MG tablet Take 1 tablet (40 mg total) by mouth daily. 90 tablet 3   busPIRone (BUSPAR) 5 MG tablet Take 1 tablet (5 mg total) by mouth 3 (three) times daily. 270 tablet 1   butalbital-acetaminophen-caffeine (FIORICET) 50-325-40 MG tablet Take by mouth.     clobetasol cream (TEMOVATE) 0.05 % Apply to hands liberally twice daily and occlude with glove for 1 hour 60 g 2   DULoxetine (CYMBALTA) 60 MG capsule Take 60 mg by mouth daily.     FARXIGA 10 MG TABS tablet TAKE 1 TABLET BY MOUTH ONCE DAILY BEFORE BREAKFAST 90 tablet 0   gabapentin (NEURONTIN) 300 MG capsule One tab PO qHS for a week,  then BID for a week, then TID. May double weekly to a max of 3,'600mg'$ /day 90 capsule 2   glipiZIDE (GLUCOTROL XL) 2.5 MG 24 hr tablet Take 1 tablet (2.5 mg total) by mouth daily with breakfast. 30 tablet 2   glucose blood (FREESTYLE LITE) test strip USE AS DIRECTED to test blood sugar once daily. DX E11.9. May sub with patient or insurance choice 100 each 1   hydrochlorothiazide (HYDRODIURIL) 25 MG tablet Take 1 tablet by mouth once daily 90 tablet 1   Ibuprofen-Famotidine 800-26.6 MG TABS Take 1 tablet by mouth daily as needed.      levocetirizine (XYZAL) 5 MG tablet Take 1 tablet (5 mg total) by mouth every evening. 90 tablet 3   losartan (COZAAR) 100 MG tablet Take 1 tablet (100 mg total) by mouth daily. 90 tablet 0   montelukast (SINGULAIR) 10 MG tablet Take 10 mg by mouth at bedtime.     pantoprazole (PROTONIX) 40 MG tablet Take 1 tablet by mouth once daily 90 tablet 0   ReliOn Ultra Thin Lancets MISC 1 Device by Does not apply route as needed (blood sugar testing). 100 each 1   sitaGLIPtin (JANUVIA) 100 MG tablet Take 1 tablet (100 mg total) by mouth daily. 30 tablet 2   terbinafine (LAMISIL) 250 MG tablet Take 250 mg by mouth daily.     vitamin B-12 (CYANOCOBALAMIN) 500 MCG tablet Take 1,000 mcg by mouth daily.     vitamin C (ASCORBIC ACID) 250 MG tablet Take 1 tablet (250 mg total) by mouth daily. 30 tablet 12   traMADol (ULTRAM) 50 MG tablet Take 1 tablet as needed every 6 hours. 40 tablet 0   No facility-administered medications prior to visit.    Allergies  Allergen Reactions   Metformin And Related     Nausea/GI upset   Trulicity [Dulaglutide]     Nausea/GI upset   Lisinopril Cough    Review of Systems All review of systems negative except what is listed in the HPI     Objective:    Physical Exam Vitals reviewed.  Constitutional:      General: She is not in acute distress.    Appearance: Normal appearance. She is normal weight. She is not ill-appearing.  HENT:      Head: Normocephalic and atraumatic.     Mouth/Throat:     Mouth: Mucous membranes are moist.     Pharynx: Oropharynx is clear. No oropharyngeal exudate or posterior oropharyngeal erythema.     Comments: Tender to light palpation of right lower jaw Cardiovascular:     Heart sounds: Normal heart sounds.  Pulmonary:  Breath sounds: Normal breath sounds.  Musculoskeletal:     Cervical back: Normal range of motion and neck supple. No tenderness.  Lymphadenopathy:     Cervical: No cervical adenopathy.  Skin:    General: Skin is warm and dry.     Findings: No rash.  Neurological:     Mental Status: She is alert and oriented to person, place, and time.  Psychiatric:        Mood and Affect: Mood normal.        Behavior: Behavior normal.        Thought Content: Thought content normal.        Judgment: Judgment normal.    BP 126/71   Pulse (!) 59   Temp 97.9 F (36.6 C)   Resp 17   SpO2 98%  Wt Readings from Last 3 Encounters:  10/14/20 182 lb (82.6 kg)  09/24/20 182 lb (82.6 kg)  09/08/20 182 lb (82.6 kg)    Health Maintenance Due  Topic Date Due   COVID-19 Vaccine (4 - Booster for Pfizer series) 05/05/2020   OPHTHALMOLOGY EXAM  07/30/2020   INFLUENZA VACCINE  10/20/2020    There are no preventive care reminders to display for this patient.   Lab Results  Component Value Date   TSH 0.54 12/06/2019   Lab Results  Component Value Date   WBC 5.2 12/06/2019   HGB 13.1 12/06/2019   HCT 38.7 12/06/2019   MCV 91.5 12/06/2019   PLT 210 12/06/2019   Lab Results  Component Value Date   NA 137 12/06/2019   K 4.1 12/06/2019   CO2 30 12/06/2019   GLUCOSE 117 (H) 12/06/2019   BUN 18 12/06/2019   CREATININE 0.73 12/06/2019   BILITOT 0.5 12/06/2019   ALKPHOS 54 05/26/2016   AST 21 12/06/2019   ALT 27 12/06/2019   PROT 6.5 12/06/2019   ALBUMIN 4.0 05/26/2016   CALCIUM 9.1 12/06/2019   Lab Results  Component Value Date   CHOL 170 12/06/2019   Lab Results   Component Value Date   HDL 63 12/06/2019   Lab Results  Component Value Date   LDLCALC 87 12/06/2019   Lab Results  Component Value Date   TRIG 103 12/06/2019   Lab Results  Component Value Date   CHOLHDL 2.7 12/06/2019   Lab Results  Component Value Date   HGBA1C 6.7 (A) 09/24/2020       Assessment & Plan:   1. Pain, dental Will go ahead and give her 5 days of tramadol for pain management and encouraged her to finish the antibiotics she was given. No signs of severe infection or sepsis indicating blood cultures. No fax received from dental office. Offered CBC, but patient was insistent on wanting to know what bacteria was causing the infection. No lesions or drainage to swab. Educated patient and she deferred CBC today. States she will go back to the dentist. Patient aware of signs/symptoms requiring further/urgent evaluation.  - traMADol (ULTRAM) 50 MG tablet; Take 1 tablet (50 mg total) by mouth every 8 (eight) hours as needed for up to 5 days.  Dispense: 15 tablet; Refill: 0  Follow-up as needed.   Purcell Nails Olevia Bowens, DNP, FNP-C

## 2020-10-30 NOTE — Patient Instructions (Signed)
Tramadol for pain management No fax received yet, check with your dentist about what blood work he is wanting.  Let us know if you need anything else [facility] you feel better soon!

## 2020-11-04 ENCOUNTER — Telehealth: Payer: Self-pay | Admitting: Physician Assistant

## 2020-11-04 NOTE — Telephone Encounter (Signed)
Please schedule follow up with [facility] for patient.

## 2020-11-04 NOTE — Telephone Encounter (Signed)
Patient brought back a note from her dentist stating the following:  "The following letter is in relation to our mutual patient Diane Brooks. She actually is suffering from paresthesia lower right anterior portion of the jaw (mandible). Sometimes this parathesia is the product of a dental infection; she has been on antibiotic and I believe you recently prescribed Tramadol. Sometimes the paresthesia could be caused by systemic conditions: trigeminal nerve issues or strokes. We just want you to evaluate and tell us by your evaluation and test if this paresthesia is or is not a product of the above systemic issues."   This letter was [place]. Phone [phone] Fax [phone]   Please advise.

## 2020-11-04 NOTE — Telephone Encounter (Signed)
Okay, please have her schedule follow-up with PCP to address the issue when she saw Lovena Le it was for acute and at the time she only complained of pain and not numbness or tingling numbness and tingling is a different work-up and will likely need further imaging.

## 2020-11-04 NOTE — Telephone Encounter (Signed)
[name]'s next appropriate appointment was on Monday and patient wanted to be seen this week. Patient scheduled an appointment with [name] on this Thursday. AM

## 2020-11-04 NOTE — Telephone Encounter (Signed)
Patient was in office and stated she needed Labs ordered, due to the dental pain she is having, that her dentist wants her to have Lab work done. She had a paper but the paper did not state with Labs she needed. I spoke with JJ, since [place] Planas and Lovena Le are out of the office (Patient saw Lovena Le last week for this issue). Routing to covering Provider. Patient stated she is supposed to see the dentist later and would try to find out which Labs she needed. AM

## 2020-11-06 ENCOUNTER — Encounter: Payer: Self-pay | Admitting: Family Medicine

## 2020-11-06 ENCOUNTER — Other Ambulatory Visit: Payer: Self-pay

## 2020-11-06 ENCOUNTER — Ambulatory Visit (INDEPENDENT_AMBULATORY_CARE_PROVIDER_SITE_OTHER): Payer: 59 | Admitting: Family Medicine

## 2020-11-06 VITALS — BP 149/69 | HR 71 | Ht 62.0 in | Wt 180.0 lb

## 2020-11-06 DIAGNOSIS — E1159 Type 2 diabetes mellitus with other circulatory complications: Secondary | ICD-10-CM

## 2020-11-06 DIAGNOSIS — I10 Essential (primary) hypertension: Secondary | ICD-10-CM | POA: Diagnosis not present

## 2020-11-06 DIAGNOSIS — R6884 Jaw pain: Secondary | ICD-10-CM

## 2020-11-06 DIAGNOSIS — M26621 Arthralgia of right temporomandibular joint: Secondary | ICD-10-CM

## 2020-11-06 DIAGNOSIS — K219 Gastro-esophageal reflux disease without esophagitis: Secondary | ICD-10-CM

## 2020-11-06 DIAGNOSIS — E1142 Type 2 diabetes mellitus with diabetic polyneuropathy: Secondary | ICD-10-CM | POA: Diagnosis not present

## 2020-11-06 DIAGNOSIS — K224 Dyskinesia of esophagus: Secondary | ICD-10-CM

## 2020-11-06 DIAGNOSIS — I152 Hypertension secondary to endocrine disorders: Secondary | ICD-10-CM

## 2020-11-06 MED ORDER — PANTOPRAZOLE SODIUM 40 MG PO TBEC: 40.0000 mg | DELAYED_RELEASE_TABLET | Freq: Every day | ORAL | 1 refills | Status: DC

## 2020-11-06 MED ORDER — LOSARTAN POTASSIUM 100 MG PO TABS: 100.0000 mg | ORAL_TABLET | Freq: Every day | ORAL | 1 refills | Status: DC

## 2020-11-06 MED ORDER — ATENOLOL 25 MG PO TABS
25.0000 mg | ORAL_TABLET | Freq: Every day | ORAL | 1 refills | Status: DC
Start: 2020-11-06 — End: 2020-12-22

## 2020-11-06 MED ORDER — GLIPIZIDE ER 2.5 MG PO TB24: 2.5000 mg | ORAL_TABLET | Freq: Every day | ORAL | 1 refills | Status: DC

## 2020-11-06 MED ORDER — HYDROCHLOROTHIAZIDE 25 MG PO TABS
25.0000 mg | ORAL_TABLET | Freq: Every day | ORAL | 1 refills | Status: DC
Start: 2020-11-06 — End: 2020-12-01

## 2020-11-06 MED ORDER — SITAGLIPTIN PHOSPHATE 100 MG PO TABS: 100.0000 mg | ORAL_TABLET | Freq: Every day | ORAL | 1 refills | Status: DC

## 2020-11-06 MED ORDER — ATORVASTATIN CALCIUM 40 MG PO TABS: 40.0000 mg | ORAL_TABLET | Freq: Every day | ORAL | 1 refills | Status: DC

## 2020-11-06 NOTE — Patient Instructions (Signed)
Normal neuro exam today.  Discussed with Dr. Sheppard Coil. MRI of jaw ordered. Someone will be calling to schedule. Continue tapering up gabapentin as ordered.

## 2020-11-06 NOTE — Progress Notes (Signed)
Acute Office Visit  Subjective:    Patient ID: Diane Brooks, female    DOB: [birth year], 65 y.o.   MRN: [id number]  Chief Complaint  Patient presents with   Dental Pain    HPI Patient is in today for ongoing right mouth/jaw pain.   Patient was seen by me last week for same symptoms - see previous note. She had mouth/jaw pain that began a week or two after having some dental cleanings, fillings, and prep for a bridge with her dentist in [place]. Around August 3rd, she started having swelling and redness to right lower jaw (see below for picture she took on 10/25/20). The pain became severe and she reports low-grade temp. She decided to find a dentist locally in [place] to assess her. She reports the took xrays and ended up treating her with clindamycin. I gave her tramadol at last visit which she states did not help. She did get some improvement with ABX. However, despite swelling improving, she continues to have significant pain to right mandible with an areas of paresthesia to anterior right mandible (near her chin).   The local dentist sent her back to [place] to see if we can find any other systemic cause for her symptoms such as trigeminal neuralgia or stroke.  She reports the pain to right mandible is constant throbbing 8/10 and gets to 10/10 which chewing, brushing her teeth, or pressing against her jaw. She has not had any recent bleeding, drainage, fevers, slurred speech, weakness in extremities, headaches. States the pain feels like it is in her jaw bone, not external/superficial. She denies any shooting pain. Reports she feels like part of her gum may be swollen in the back of her mouth near last molar on bottom right.     Past Medical History:  Diagnosis Date   Diabetes mellitus without complication ([place])    Hyperlipidemia    Hypertension     Past Surgical History:  Procedure Laterality Date   AUGMENTATION MAMMAPLASTY     BREAST ENHANCEMENT SURGERY     ESOPHAGEAL MANOMETRY N/A  07/02/2013   Procedure: ESOPHAGEAL MANOMETRY (EM);  Surgeon: Jerene Bears, MD;  Location: [facility];  Service: Gastroenterology;  Laterality: N/A;   TONSILECTOMY/ADENOIDECTOMY WITH MYRINGOTOMY     TUBAL LIGATION     tummy tuck      Family History  Problem Relation Age of Onset   Hyperlipidemia Mother    Stroke Mother    Diabetes Father    Hyperlipidemia Father    Hypertension Father    Diabetes Maternal Grandfather    Hyperlipidemia Maternal Grandfather    Diabetes Paternal Grandfather    Hyperlipidemia Paternal Grandfather     Social History   Socioeconomic History   Marital status: Married    Spouse name: Not on file   Number of children: 2   Years of education: Not on file   Highest education level: Not on file  Occupational History   Occupation: Retired  Tobacco Use   Smoking status: Former    Types: Cigarettes    Quit date: 03/23/1991    Years since quitting: 29.6   Smokeless tobacco: Never  Vaping Use   Vaping Use: Never used  Substance and Sexual Activity   Alcohol use: No   Drug use: No   Sexual activity: Yes    Birth control/protection: Surgical, Post-menopausal  Other Topics Concern   Not on file  Social History Narrative   Not on file   Social Determinants of Health  Financial Resource Strain: Not on file  Food Insecurity: Not on file  Transportation Needs: Not on file  Physical Activity: Not on file  Stress: Not on file  Social Connections: Not on file  Intimate Partner Violence: Not on file    Outpatient Medications Prior to Visit  Medication Sig Dispense Refill   AIMOVIG 140 MG/ML SOAJ Inject into the skin.     AMBULATORY NON FORMULARY MEDICATION Freestyle Glucometer   Dx: 250.00 DM, type II 1 Device 0   AMBULATORY NON FORMULARY MEDICATION Freestyle test strips.   DX Type II diabetes 100 strip 1   busPIRone (BUSPAR) 5 MG tablet Take 1 tablet (5 mg total) by mouth 3 (three) times daily. 270 tablet 1   butalbital-acetaminophen-caffeine  (FIORICET) 50-325-40 MG tablet Take by mouth.     clobetasol cream (TEMOVATE) 0.05 % Apply to hands liberally twice daily and occlude with glove for 1 hour 60 g 2   DULoxetine (CYMBALTA) 60 MG capsule Take 60 mg by mouth daily.     FARXIGA 10 MG TABS tablet TAKE 1 TABLET BY MOUTH ONCE DAILY BEFORE BREAKFAST 90 tablet 0   gabapentin (NEURONTIN) 300 MG capsule One tab PO qHS for a week, then BID for a week, then TID. May double weekly to a max of 3,'600mg'$ /day 90 capsule 2   glucose blood (FREESTYLE LITE) test strip USE AS DIRECTED to test blood sugar once daily. DX E11.9. May sub with patient or insurance choice 100 each 1   Ibuprofen-Famotidine 800-26.6 MG TABS Take 1 tablet by mouth daily as needed.      levocetirizine (XYZAL) 5 MG tablet Take 1 tablet (5 mg total) by mouth every evening. 90 tablet 3   montelukast (SINGULAIR) 10 MG tablet Take 10 mg by mouth at bedtime.     ReliOn Ultra Thin Lancets MISC 1 Device by Does not apply route as needed (blood sugar testing). 100 each 1   terbinafine (LAMISIL) 250 MG tablet Take 250 mg by mouth daily.     vitamin B-12 (CYANOCOBALAMIN) 500 MCG tablet Take 1,000 mcg by mouth daily.     vitamin C (ASCORBIC ACID) 250 MG tablet Take 1 tablet (250 mg total) by mouth daily. 30 tablet 12   atenolol (TENORMIN) 25 MG tablet Take 1 tablet by mouth once daily 90 tablet 1   atorvastatin (LIPITOR) 40 MG tablet Take 1 tablet (40 mg total) by mouth daily. 90 tablet 3   glipiZIDE (GLUCOTROL XL) 2.5 MG 24 hr tablet Take 1 tablet (2.5 mg total) by mouth daily with breakfast. 30 tablet 2   hydrochlorothiazide (HYDRODIURIL) 25 MG tablet Take 1 tablet by mouth once daily 90 tablet 1   losartan (COZAAR) 100 MG tablet Take 1 tablet (100 mg total) by mouth daily. 90 tablet 0   pantoprazole (PROTONIX) 40 MG tablet Take 1 tablet by mouth once daily 90 tablet 0   sitaGLIPtin (JANUVIA) 100 MG tablet Take 1 tablet (100 mg total) by mouth daily. 30 tablet 2   No facility-administered  medications prior to visit.    Allergies  Allergen Reactions   Metformin And Related     Nausea/GI upset   Trulicity [Dulaglutide]     Nausea/GI upset   Lisinopril Cough    Review of Systems All review of systems negative except what is listed in the HPI     Objective:    Physical Exam Constitutional:      Appearance: Normal appearance.  HENT:     Head:  Normocephalic and atraumatic.     Mouth/Throat:     Mouth: Mucous membranes are moist.     Pharynx: Oropharynx is clear.     Comments: Possible some mild gum swelling on bottom right, see picture, no erythema or visible abscesses Musculoskeletal:        General: Normal range of motion.     Cervical back: Normal range of motion and neck supple.  Lymphadenopathy:     Cervical: No cervical adenopathy.  Skin:    General: Skin is warm and dry.     Capillary Refill: Capillary refill takes less than 2 seconds.     Findings: No bruising, erythema, lesion or rash.  Neurological:     General: No focal deficit present.     Mental Status: She is alert and oriented to person, place, and time.     Cranial Nerves: Cranial nerves are intact.     Sensory: Sensation is intact.     Motor: Motor function is intact.     Coordination: Coordination is intact.     Gait: Gait is intact.     Comments: Reports sensation over right lower chin does not feel the same as left side. She can feel "coolness" and like it is numb/puffy as if she has recently had numbing medication   Psychiatric:        Mood and Affect: Mood normal.        Behavior: Behavior normal.        Thought Content: Thought content normal.        Judgment: Judgment normal.     Today: no acute findings in this area, states the right side of her lip feels a little different; symmetrical smile  Today: discomfort in right lower jaw, reports she feels like the area past last molar feels quite swollen  August 6th, about 3 days after symptoms started: right cheek/jaw  swelling  Today: swelling mostly resolved   BP (!) 149/69   Pulse 71   Ht '5\' 2"'$  (1.575 m)   Wt 180 lb (81.6 kg)   SpO2 99%   BMI 32.92 kg/m  Wt Readings from Last 3 Encounters:  11/06/20 180 lb (81.6 kg)  10/14/20 182 lb (82.6 kg)  09/24/20 182 lb (82.6 kg)    Health Maintenance Due  Topic Date Due   COVID-19 Vaccine (4 - Booster for Pfizer series) 05/05/2020   OPHTHALMOLOGY EXAM  07/30/2020   INFLUENZA VACCINE  10/20/2020    There are no preventive care reminders to display for this patient.   Lab Results  Component Value Date   TSH 0.54 12/06/2019   Lab Results  Component Value Date   WBC 5.2 12/06/2019   HGB 13.1 12/06/2019   HCT 38.7 12/06/2019   MCV 91.5 12/06/2019   PLT 210 12/06/2019   Lab Results  Component Value Date   NA 137 12/06/2019   K 4.1 12/06/2019   CO2 30 12/06/2019   GLUCOSE 117 (H) 12/06/2019   BUN 18 12/06/2019   CREATININE 0.73 12/06/2019   BILITOT 0.5 12/06/2019   ALKPHOS 54 05/26/2016   AST 21 12/06/2019   ALT 27 12/06/2019   PROT 6.5 12/06/2019   ALBUMIN 4.0 05/26/2016   CALCIUM 9.1 12/06/2019   Lab Results  Component Value Date   CHOL 170 12/06/2019   Lab Results  Component Value Date   HDL 63 12/06/2019   Lab Results  Component Value Date   LDLCALC 87 12/06/2019   Lab Results  Component Value  Date   TRIG 103 12/06/2019   Lab Results  Component Value Date   CHOLHDL 2.7 12/06/2019   Lab Results  Component Value Date   HGBA1C 6.7 (A) 09/24/2020       Assessment & Plan:   1. Essential hypertension, benign - atenolol (TENORMIN) 25 MG tablet; Take 1 tablet (25 mg total) by mouth daily.  Dispense: 90 tablet; Refill: 1 - hydrochlorothiazide (HYDRODIURIL) 25 MG tablet; Take 1 tablet (25 mg total) by mouth daily.  Dispense: 90 tablet; Refill: 1  2. Controlled type 2 diabetes mellitus with diabetic polyneuropathy, without long-term current use of insulin ([facility]) - glipiZIDE (GLUCOTROL XL) 2.5 MG 24 hr tablet; Take  1 tablet (2.5 mg total) by mouth daily with breakfast.  Dispense: 90 tablet; Refill: 1 - sitaGLIPtin (JANUVIA) 100 MG tablet; Take 1 tablet (100 mg total) by mouth daily.  Dispense: 90 tablet; Refill: 1  3. Hypertension associated with diabetes ([place]) - losartan (COZAAR) 100 MG tablet; Take 1 tablet (100 mg total) by mouth daily.  Dispense: 90 tablet; Refill: 1  4. Gastroesophageal reflux disease without esophagitis - pantoprazole (PROTONIX) 40 MG tablet; Take 1 tablet (40 mg total) by mouth daily.  Dispense: 90 tablet; Refill: 1  5. Nutcracker esophagus - pantoprazole (PROTONIX) 40 MG tablet; Take 1 tablet (40 mg total) by mouth daily.  Dispense: 90 tablet; Refill: 1     6. Pain in lower jaw 7. Arthralgia of right temporomandibular joint Pain in jaw with slight decrease in ROM due to discomfort. Per patient, xrays from dentist were done and they wanted [place] to work up further. She is doing somewhat better after antibiotics, but pain and paresthesia persists. No signs of stroke (normal neuro exam) and symptoms not fully consistent with trigeminal neuralgia. Discussed options with patient. She would like to proceed with further imaging. Consulted with Dr. Sheppard Coil (PCP not in office today) - mandibular MRI ordered. Patient was recently started on gabapentin - discussed how this medication can help with nerve pain. Encouraged close follow-up with her dentist. Reports she is going back to her regular dentist in [place] later next week. Patient aware of signs/symptoms requiring further/urgent evaluation.  - MR TMJ  Follow-up if symptoms worsen or fail to improve.   [facility] [employer] Olevia Bowens, DNP, FNP-C

## 2020-11-07 ENCOUNTER — Other Ambulatory Visit: Payer: Self-pay | Admitting: Family Medicine

## 2020-11-07 DIAGNOSIS — M26621 Arthralgia of right temporomandibular joint: Secondary | ICD-10-CM

## 2020-11-11 ENCOUNTER — Other Ambulatory Visit: Payer: Self-pay | Admitting: Physician Assistant

## 2020-11-11 DIAGNOSIS — E119 Type 2 diabetes mellitus without complications: Secondary | ICD-10-CM

## 2020-11-11 MED ORDER — LANCETS MISC: 99 refills | Status: AC

## 2020-12-01 ENCOUNTER — Other Ambulatory Visit: Payer: Self-pay

## 2020-12-01 DIAGNOSIS — I10 Essential (primary) hypertension: Secondary | ICD-10-CM

## 2020-12-01 DIAGNOSIS — E1142 Type 2 diabetes mellitus with diabetic polyneuropathy: Secondary | ICD-10-CM

## 2020-12-01 MED ORDER — HYDROCHLOROTHIAZIDE 25 MG PO TABS
25.0000 mg | ORAL_TABLET | Freq: Every day | ORAL | 0 refills | Status: DC
Start: 2020-12-01 — End: 2020-12-22

## 2020-12-01 MED ORDER — SITAGLIPTIN PHOSPHATE 100 MG PO TABS: 100.0000 mg | ORAL_TABLET | Freq: Every day | ORAL | 0 refills | Status: DC

## 2020-12-22 ENCOUNTER — Encounter: Payer: Self-pay | Admitting: Physician Assistant

## 2020-12-22 ENCOUNTER — Ambulatory Visit (INDEPENDENT_AMBULATORY_CARE_PROVIDER_SITE_OTHER): Payer: Self-pay | Admitting: Physician Assistant

## 2020-12-22 VITALS — BP 144/78 | HR 66 | Ht 62.0 in | Wt 183.0 lb

## 2020-12-22 DIAGNOSIS — F411 Generalized anxiety disorder: Secondary | ICD-10-CM

## 2020-12-22 DIAGNOSIS — K219 Gastro-esophageal reflux disease without esophagitis: Secondary | ICD-10-CM

## 2020-12-22 DIAGNOSIS — R200 Anesthesia of skin: Secondary | ICD-10-CM | POA: Insufficient documentation

## 2020-12-22 DIAGNOSIS — R6884 Jaw pain: Secondary | ICD-10-CM

## 2020-12-22 DIAGNOSIS — K224 Dyskinesia of esophagus: Secondary | ICD-10-CM

## 2020-12-22 DIAGNOSIS — M7918 Myalgia, other site: Secondary | ICD-10-CM

## 2020-12-22 DIAGNOSIS — I10 Essential (primary) hypertension: Secondary | ICD-10-CM

## 2020-12-22 DIAGNOSIS — E079 Disorder of thyroid, unspecified: Secondary | ICD-10-CM

## 2020-12-22 DIAGNOSIS — E1159 Type 2 diabetes mellitus with other circulatory complications: Secondary | ICD-10-CM

## 2020-12-22 DIAGNOSIS — I152 Hypertension secondary to endocrine disorders: Secondary | ICD-10-CM

## 2020-12-22 DIAGNOSIS — Z13220 Encounter for screening for lipoid disorders: Secondary | ICD-10-CM

## 2020-12-22 DIAGNOSIS — J302 Other seasonal allergic rhinitis: Secondary | ICD-10-CM

## 2020-12-22 DIAGNOSIS — E1142 Type 2 diabetes mellitus with diabetic polyneuropathy: Secondary | ICD-10-CM

## 2020-12-22 DIAGNOSIS — E559 Vitamin D deficiency, unspecified: Secondary | ICD-10-CM

## 2020-12-22 DIAGNOSIS — R519 Headache, unspecified: Secondary | ICD-10-CM

## 2020-12-22 DIAGNOSIS — G629 Polyneuropathy, unspecified: Secondary | ICD-10-CM

## 2020-12-22 MED ORDER — ATENOLOL 25 MG PO TABS: 25.0000 mg | ORAL_TABLET | Freq: Every day | ORAL | 1 refills | Status: DC

## 2020-12-22 MED ORDER — GABAPENTIN 300 MG PO CAPS: ORAL_CAPSULE | ORAL | 2 refills | Status: DC

## 2020-12-22 MED ORDER — HYDROCHLOROTHIAZIDE 25 MG PO TABS: 25.0000 mg | ORAL_TABLET | Freq: Every day | ORAL | 0 refills | Status: DC

## 2020-12-22 MED ORDER — BUSPIRONE HCL 5 MG PO TABS: 5.0000 mg | ORAL_TABLET | Freq: Three times a day (TID) | ORAL | 1 refills | Status: DC

## 2020-12-22 MED ORDER — SITAGLIPTIN PHOSPHATE 100 MG PO TABS: 100.0000 mg | ORAL_TABLET | Freq: Every day | ORAL | 0 refills | Status: DC

## 2020-12-22 MED ORDER — PANTOPRAZOLE SODIUM 40 MG PO TBEC: 40.0000 mg | DELAYED_RELEASE_TABLET | Freq: Every day | ORAL | 1 refills | Status: DC

## 2020-12-22 MED ORDER — MONTELUKAST SODIUM 10 MG PO TABS: 10.0000 mg | ORAL_TABLET | Freq: Every day | ORAL | 3 refills | Status: AC

## 2020-12-22 MED ORDER — DULOXETINE HCL 60 MG PO CPEP: 60.0000 mg | ORAL_CAPSULE | Freq: Every day | ORAL | 1 refills | Status: DC

## 2020-12-22 MED ORDER — DAPAGLIFLOZIN PROPANEDIOL 10 MG PO TABS: 10.0000 mg | ORAL_TABLET | Freq: Every day | ORAL | 0 refills | Status: DC

## 2020-12-22 MED ORDER — LOSARTAN POTASSIUM 100 MG PO TABS: 100.0000 mg | ORAL_TABLET | Freq: Every day | ORAL | 1 refills | Status: DC

## 2020-12-22 MED ORDER — GLIPIZIDE ER 2.5 MG PO TB24: 2.5000 mg | ORAL_TABLET | Freq: Every day | ORAL | 1 refills | Status: DC

## 2020-12-22 NOTE — Patient Instructions (Signed)
Goal BP is under 130/80 if not reaching most of the time. Follow up.

## 2020-12-22 NOTE — Progress Notes (Signed)
Subjective:    Patient ID: Diane Brooks, female    DOB: [birth year], 65 y.o.   MRN: [id number]  HPI Pt is a 65 yo female who presents to the clinic for follow up.   She is having some numbness and tingling and pain around right jaw and chin. Dentist needs to extract 3 teeth but wanting clearance from neurology that this is not stroke related. No vision changes, headaches, upper ext weakness, speech changes. Symptoms have improved with abx for dental infection.   .. Active Ambulatory Problems    Diagnosis Date Noted  . Hyperlipidemia 03/28/2013  . Hiatal hernia 03/28/2013  . Essential hypertension, benign 03/28/2013  . Diabetes mellitus type II, controlled ([place]) 03/28/2013  . Gastroesophageal reflux disease 05/10/2013  . Globus sensation 05/10/2013  . Atypical chest pain 05/10/2013  . Internal hemorrhoids 05/10/2013  . Decreased libido 05/10/2013  . Dysplasia of cervix, low grade (CIN 1) 05/24/2013  . Colon cancer screening 05/25/2013  . Obesity (BMI 30-39.9) 06/27/2013  . Diabetes mellitus with microalbuminuria ([place]) 06/27/2013  . Hyperlipemia 06/27/2013  . Cyst, eyelid sebaceous 06/27/2013  . Onychomycosis 06/27/2013  . 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 Sudoriferous cyst 07/17/2013  . Seasonal allergies 08/03/2013  . Allergic rhinitis 08/03/2013  . Tinea versicolor 08/03/2013  . Class 1 obesity due to excess calories with serious comorbidity and body mass index (BMI) of 33.0 to 33.9 in adult 10/29/2013  . Abdominal pain, epigastric 11/09/2013  . Migraine headache without aura 10/16/2015  . De Quervain's disease (radial styloid tenosynovitis) 11/14/2015  . BPV (benign positional vertigo) 11/14/2015  . Polyarthralgia 11/19/2015  . Mouth sores 12/15/2015  . Black stools 12/15/2015  . Light stools 03/03/2016  . Systolic murmur 56/97/9480  . Low serum vitamin B12 04/20/2017  . Memory changes 04/24/2017  . Itching 06/13/2017  . Anxiety 06/13/2017  . Lipoma of left lower  extremity 06/13/2017  . Reactive airway disease 07/03/2017  . Shortness of breath 07/07/2017  . Myofascial pain syndrome 07/14/2017  . Vitamin D deficiency 09/12/2017  . Microalbuminuria 09/13/2017  . Hypertension associated with diabetes ([place]) 03/07/2018  . Blepharospasm 06/13/2018  . Anxiety as acute reaction to exceptional stress 09/07/2018  . Facet arthritis, degenerative, lumbar spine 04/16/2019  . Spondylosis without myelopathy or radiculopathy, cervical region 04/16/2019  . Left lumbar radiculitis 04/27/2019  . Radiculitis of left cervical region 04/27/2019  . Urinary incontinence 05/18/2019  . Protrusion of lumbar intervertebral disc 05/21/2019  . DDD (degenerative disc disease), lumbar 05/21/2019  . Trochanteric bursitis, left hip 05/29/2019  . Hyperlipidemia associated with type 2 diabetes mellitus ([place]) 12/11/2019  . Acute midline thoracic back pain 12/11/2019  . Chronic pain 01/16/2020  . Moderate episode of recurrent major depressive disorder ([place]) 01/22/2020  . Neuropathy 03/31/2020  . Sinus pressure 05/26/2020  . Tongue lesion 06/29/2020  . Dyshidrotic eczema 07/07/2020  . Bloating 08/27/2020  . Loose stools 08/27/2020  . Medication side effect 10/24/2020  . Perioral numbness 12/22/2020  . Jaw pain 12/22/2020   Resolved Ambulatory Problems    Diagnosis Date Noted  . Abnormal weight gain 06/27/2013  . Hot flashes 10/29/2013  . Mid back pain 10/24/2015  . Left-sided low back pain with left-sided sciatica 10/24/2015  . Neck pain 10/24/2015  . Muscle spasm of back 11/14/2015  . Persistent cough for 3 weeks or longer 07/03/2017  . Visit for screening mammogram 09/12/2017  . Chronic left-sided low back pain without sciatica 03/09/2018  . DDD (degenerative disc disease), cervical 04/16/2019  Past Medical History:  Diagnosis Date  . Diabetes mellitus without complication ([place])   . Hypertension        Review of Systems See HPI.     Objective:   Physical  Exam Vitals reviewed.  Constitutional:      Appearance: Normal appearance.  HENT:     Head: Normocephalic.     Right Ear: Tympanic membrane normal.     Left Ear: Tympanic membrane normal.     Nose: Nose normal.     Mouth/Throat:     Mouth: Mucous membranes are moist.  Eyes:     Conjunctiva/sclera: Conjunctivae normal.  Cardiovascular:     Rate and Rhythm: Normal rate and regular rhythm.     Pulses: Normal pulses.  Pulmonary:     Effort: Pulmonary effort is normal.     Breath sounds: Normal breath sounds.  Neurological:     General: No focal deficit present.     Mental Status: She is alert and oriented to person, place, and time.     Cranial Nerves: No cranial nerve deficit.     Sensory: No sensory deficit.     Motor: No weakness.     Coordination: Coordination normal.     Gait: Gait normal.  Psychiatric:        Mood and Affect: Mood normal.       Results for orders placed or performed in visit on 12/22/20  Lipid Panel w/reflex Direct LDL  Result Value Ref Range   Cholesterol 172 <200 mg/dL   HDL 60 > OR = 50 mg/dL   Triglycerides 121 <150 mg/dL   LDL Cholesterol (Calc) 90 mg/dL (calc)   Total CHOL/HDL Ratio 2.9 <5.0 (calc)   Non-HDL Cholesterol (Calc) 112 <130 mg/dL (calc)  COMPLETE METABOLIC PANEL WITH GFR  Result Value Ref Range   Glucose, Bld 121 (H) 65 - 99 mg/dL   BUN 18 7 - 25 mg/dL   Creat 0.82 0.50 - 1.05 mg/dL   eGFR 80 > OR = 60 mL/min/1.53m   BUN/Creatinine Ratio NOT APPLICABLE 6 - 22 (calc)   Sodium 140 135 - 146 mmol/L   Potassium 4.8 3.5 - 5.3 mmol/L   Chloride 101 98 - 110 mmol/L   CO2 32 20 - 32 mmol/L   Calcium 10.1 8.6 - 10.4 mg/dL   Total Protein 7.0 6.1 - 8.1 g/dL   Albumin 4.3 3.6 - 5.1 g/dL   Globulin 2.7 1.9 - 3.7 g/dL (calc)   AG Ratio 1.6 1.0 - 2.5 (calc)   Total Bilirubin 0.7 0.2 - 1.2 mg/dL   Alkaline phosphatase (APISO) 73 37 - 153 U/L   AST 18 10 - 35 U/L   ALT 18 6 - 29 U/L  TSH  Result Value Ref Range   TSH 1.21 0.40 -  4.50 mIU/L  B12 and Folate Panel  Result Value Ref Range   Vitamin B-12 497 200 - 1,100 pg/mL   Folate 12.8 ng/mL  VITAMIN D 25 Hydroxy (Vit-D Deficiency, Fractures)  Result Value Ref Range   Vit D, 25-Hydroxy 26 (L) 30 - 100 ng/mL  Hemoglobin A1c  Result Value Ref Range   Hgb A1c MFr Bld 6.3 (H) <5.7 % of total Hgb   Mean Plasma Glucose 134 mg/dL   eAG (mmol/L) 7.4 mmol/L    Assessment & Plan:  .[name] KitchenMarland KitchenMercedewas seen today for numbness.  Diagnoses and all orders for this visit:  Perioral numbness -     Ambulatory referral to Neurology  Essential  hypertension, benign -     COMPLETE METABOLIC PANEL WITH GFR -     B12 and Folate Panel -     hydrochlorothiazide (HYDRODIURIL) 25 MG tablet; Take 1 tablet (25 mg total) by mouth daily. -     atenolol (TENORMIN) 25 MG tablet; Take 1 tablet (25 mg total) by mouth daily.  Controlled type 2 diabetes mellitus with diabetic polyneuropathy, without long-term current use of insulin ([facility]) -     Lipid Panel w/reflex Direct LDL -     COMPLETE METABOLIC PANEL WITH GFR -     Hemoglobin A1c -     dapagliflozin propanediol (FARXIGA) 10 MG TABS tablet; Take 1 tablet (10 mg total) by mouth daily before breakfast. -     glipiZIDE (GLUCOTROL XL) 2.5 MG 24 hr tablet; Take 1 tablet (2.5 mg total) by mouth daily with breakfast. -     sitaGLIPtin (JANUVIA) 100 MG tablet; Take 1 tablet (100 mg total) by mouth daily.  Vitamin D deficiency -     VITAMIN D 25 Hydroxy (Vit-D Deficiency, Fractures)  Screening for lipid disorders -     Lipid Panel w/reflex Direct LDL  Thyroid disorder -     TSH  Myofascial pain syndrome -     gabapentin (NEURONTIN) 300 MG capsule; One tab PO qHS for a week, then BID for a week, then TID. May double weekly to a max of 3,64m/day -     DULoxetine (CYMBALTA) 60 MG capsule; Take 1 capsule (60 mg total) by mouth daily.  Neuropathy -     gabapentin (NEURONTIN) 300 MG capsule; One tab PO qHS for a week, then BID for a week, then  TID. May double weekly to a max of 3,6042mday  Hypertension associated with diabetes ([place]-     losartan (COZAAR) 100 MG tablet; Take 1 tablet (100 mg total) by mouth daily.  Sinus headache -     montelukast (SINGULAIR) 10 MG tablet; Take 1 tablet (10 mg total) by mouth at bedtime.  Seasonal allergies -     montelukast (SINGULAIR) 10 MG tablet; Take 1 tablet (10 mg total) by mouth at bedtime.  Gastroesophageal reflux disease without esophagitis -     pantoprazole (PROTONIX) 40 MG tablet; Take 1 tablet (40 mg total) by mouth daily.  Nutcracker esophagus -     pantoprazole (PROTONIX) 40 MG tablet; Take 1 tablet (40 mg total) by mouth daily.  GAD (generalized anxiety disorder) -     busPIRone (BUSPAR) 5 MG tablet; Take 1 tablet (5 mg total) by mouth 3 (three) times daily.  Numbness of right jaw  Jaw pain  Referral made to neurology for clearance for dental procedure.  Fasting labs ordered.

## 2020-12-23 ENCOUNTER — Other Ambulatory Visit: Payer: Self-pay | Admitting: Physician Assistant

## 2020-12-23 LAB — LIPID PANEL W/REFLEX DIRECT LDL
Cholesterol: 172 mg/dL (ref <200)
HDL: 60 mg/dL (ref >=50)
LDL Cholesterol (Calc): 90 mg/dL (calc)
Non-HDL Cholesterol (Calc): 112 mg/dL (calc) (ref <130)
Total CHOL/HDL Ratio: 2.9 (calc) (ref <5.0)
Triglycerides: 121 mg/dL (ref <150)

## 2020-12-23 LAB — COMPLETE METABOLIC PANEL WITH GFR
AG Ratio: 1.6 (calc) (ref 1.0–2.5)
ALT: 18 U/L (ref 6–29)
AST: 18 U/L (ref 10–35)
Albumin: 4.3 g/dL (ref 3.6–5.1)
Alkaline phosphatase (APISO): 73 U/L (ref 37–153)
BUN: 18 mg/dL (ref 7–25)
CO2: 32 mmol/L (ref 20–32)
Calcium: 10.1 mg/dL (ref 8.6–10.4)
Chloride: 101 mmol/L (ref 98–110)
Creat: 0.82 mg/dL (ref 0.50–1.05)
Globulin: 2.7 g/dL (calc) (ref 1.9–3.7)
Glucose, Bld: 121 mg/dL — ABNORMAL HIGH (ref 65–99)
Potassium: 4.8 mmol/L (ref 3.5–5.3)
Sodium: 140 mmol/L (ref 135–146)
Total Bilirubin: 0.7 mg/dL (ref 0.2–1.2)
Total Protein: 7 g/dL (ref 6.1–8.1)
eGFR: 80 mL/min/{1.73_m2} (ref >=60)

## 2020-12-23 LAB — HEMOGLOBIN A1C
Hgb A1c MFr Bld: 6.3 % of total Hgb — ABNORMAL HIGH (ref <5.7)
Mean Plasma Glucose: 134 mg/dL
eAG (mmol/L): 7.4 mmol/L

## 2020-12-23 LAB — VITAMIN D 25 HYDROXY (VIT D DEFICIENCY, FRACTURES): Vit D, 25-Hydroxy: 26 ng/mL — ABNORMAL LOW (ref 30–100)

## 2020-12-23 LAB — B12 AND FOLATE PANEL
Folate: 12.8 ng/mL
Vitamin B-12: 497 pg/mL (ref 200–1100)

## 2020-12-23 LAB — TSH: TSH: 1.21 mIU/L (ref 0.40–4.50)

## 2020-12-23 MED ORDER — ATORVASTATIN CALCIUM 80 MG PO TABS: 80.0000 mg | ORAL_TABLET | Freq: Every day | ORAL | 3 refills | Status: AC

## 2020-12-23 NOTE — Progress Notes (Signed)
Diane Brooks,   Your A1C has improved! Down to 6.3. great news.  Your B12 is much better. Continue the B12 supplement.  Your Vitamin D is better but still not to goal. Confirm how much vit D you are taking so we can increase it. Make sure taking with protein/meal for better absorption.  Thyroid is great.  Kidney and liver look good.  LDL goal is under 70. I am going to increase lipitor to get you to goal for primary CV prevention.

## 2020-12-24 ENCOUNTER — Ambulatory Visit: Payer: 59 | Admitting: Physician Assistant

## 2020-12-29 ENCOUNTER — Encounter: Payer: Self-pay | Admitting: Physician Assistant

## 2021-01-05 ENCOUNTER — Other Ambulatory Visit: Payer: Self-pay

## 2021-01-05 ENCOUNTER — Other Ambulatory Visit (HOSPITAL_COMMUNITY)
Admission: RE | Admit: 2021-01-05 | Discharge: 2021-01-05 | Disposition: A | Discharge status: Home / Self Care | Payer: 59 | Source: Ambulatory Visit | Attending: Obstetrics & Gynecology | Admitting: Obstetrics & Gynecology

## 2021-01-05 ENCOUNTER — Encounter: Payer: Self-pay | Admitting: Obstetrics & Gynecology

## 2021-01-05 ENCOUNTER — Ambulatory Visit (INDEPENDENT_AMBULATORY_CARE_PROVIDER_SITE_OTHER): Payer: 59 | Admitting: Obstetrics & Gynecology

## 2021-01-05 VITALS — BP 116/68 | HR 67 | Ht 62.0 in | Wt 184.0 lb

## 2021-01-05 DIAGNOSIS — Z01419 Encounter for gynecological examination (general) (routine) without abnormal findings: Secondary | ICD-10-CM | POA: Insufficient documentation

## 2021-01-05 DIAGNOSIS — Z8742 Personal history of other diseases of the female genital tract: Secondary | ICD-10-CM | POA: Insufficient documentation

## 2021-01-05 NOTE — Addendum Note (Signed)
Addended by: Lyndal Rainbow on: 01/05/2021 02:12 PM   Modules accepted: Orders

## 2021-01-05 NOTE — Progress Notes (Signed)
Subjective:     Diane Brooks is a 65 y.o. female here for a routine exam.  Current complaints: no vaginal bleeding, door, burning, discharge.  No pelvic pain.     Gynecologic History No LMP recorded. Patient is postmenopausal. Contraception: post menopausal status Last Pap: 2021. Results were: abnormal; colpo CIN 1 Last mammogram: 10/21. Results were: normal  Obstetric History OB History  Gravida Para Term Preterm AB Living  3 2 0 0 1    SAB IAB Ectopic Multiple Live Births  1 0 0        # Outcome Date GA Lbr Len/2nd Weight Sex Delivery Anes PTL Lv  3 SAB           2 Para           1 Para              The following portions of the patient's history were reviewed and updated as appropriate: allergies, current medications, past family history, past medical history, past social history, past surgical history, and problem list.  Review of Systems Pertinent items noted in HPI and remainder of comprehensive ROS otherwise negative.    Objective:     Vitals:   01/05/21 1348  BP: 116/68  Pulse: 67  Weight: 184 lb (83.5 kg)  Height: 5\' 2"  (1.575 m)   Vitals:  WNL General appearance: alert, cooperative and no distress  HEENT: Normocephalic, without obvious abnormality, atraumatic Eyes: negative Throat: lips, mucosa, and tongue normal; teeth and gums normal  Respiratory: Clear to auscultation bilaterally  CV: Regular rate and rhythm  Breasts:  Normal appearance, no masses or tenderness, no nipple retraction or dimpling; scars from breast reduction  GI: Soft, non-tender; bowel sounds normal; no masses,  no organomegaly  GU: External Genitalia:  Tanner V, no lesion Urethra:  No prolapse   Vagina: Atrophic,no blood or discharge  Cervix: No CMT, no lesion  Uterus:  Normal size and contour, non tender, exam limited by habitus and prior abdominoplasty  Adnexa: Normal, no masses, non tender  Musculoskeletal: No edema, redness or tenderness in the calves or thighs  Skin: No  lesions or rash  Lymphatic: Axillary adenopathy: none     Psychiatric: Normal mood and behavior        Assessment:    Healthy female exam.    Plan:    Pt ythinks she had mammogram in August.  She will check with rads.  The mammogram in epic is OCtober 2021 Pap with co testing No other gyn complaints.

## 2021-01-07 ENCOUNTER — Ambulatory Visit (INDEPENDENT_AMBULATORY_CARE_PROVIDER_SITE_OTHER): Payer: 59

## 2021-01-07 ENCOUNTER — Other Ambulatory Visit: Payer: Self-pay

## 2021-01-07 DIAGNOSIS — Z1231 Encounter for screening mammogram for malignant neoplasm of breast: Secondary | ICD-10-CM

## 2021-01-07 DIAGNOSIS — Z01419 Encounter for gynecological examination (general) (routine) without abnormal findings: Secondary | ICD-10-CM

## 2021-01-13 LAB — CYTOLOGY - PAP
Comment: NEGATIVE
Comment: NEGATIVE
Diagnosis: UNDETERMINED — AB
HPV 16: NEGATIVE
HPV 18 / 45: NEGATIVE
High risk HPV: POSITIVE — AB

## 2021-01-26 ENCOUNTER — Encounter: Payer: Self-pay | Admitting: Obstetrics & Gynecology

## 2021-01-26 ENCOUNTER — Other Ambulatory Visit (HOSPITAL_COMMUNITY)
Admission: RE | Admit: 2021-01-26 | Discharge: 2021-01-26 | Disposition: A | Discharge status: Home / Self Care | Payer: 59 | Source: Ambulatory Visit | Attending: Obstetrics & Gynecology | Admitting: Obstetrics & Gynecology

## 2021-01-26 ENCOUNTER — Other Ambulatory Visit: Payer: Self-pay

## 2021-01-26 ENCOUNTER — Ambulatory Visit: Payer: 59 | Admitting: Obstetrics & Gynecology

## 2021-01-26 VITALS — BP 151/71 | HR 70 | Resp 16 | Ht 62.0 in | Wt 185.0 lb

## 2021-01-26 DIAGNOSIS — N9089 Other specified noninflammatory disorders of vulva and perineum: Secondary | ICD-10-CM | POA: Diagnosis not present

## 2021-01-26 DIAGNOSIS — R87810 Cervical high risk human papillomavirus (HPV) DNA test positive: Secondary | ICD-10-CM | POA: Diagnosis not present

## 2021-01-26 DIAGNOSIS — R87610 Atypical squamous cells of undetermined significance on cytologic smear of cervix (ASC-US): Secondary | ICD-10-CM | POA: Insufficient documentation

## 2021-01-26 NOTE — Progress Notes (Signed)
   Subjective:    Patient ID: Diane Brooks, female    DOB: [birth year], 65 y.o.   MRN: [id number]  HPI  65 year old female presents for colposcopy for ASCUS and HPV positive.  She had this after a biopsy last year here with CIN 1.  Pt was HPV + at [facility] in 2020.  Patient was taken to the operating room for a D&C and the colposcopy was done in the operating room.  It does not look like they did any cervical biopsies or ECC. Patient complaining of itching and burning on her right labia.  She saw a lesion on her labia which is still present today.  Patient does admit to washing 2-3 times a day with what ever soap she uses on her body.  Patient does not shave her pubic hair.  Review of Systems  Constitutional: Negative.   Respiratory: Negative.    Cardiovascular: Negative.   Gastrointestinal: Negative.   Genitourinary: Negative.        Labial burning and pain (right)      Objective:   Physical Exam Vitals reviewed.  Constitutional:      General: She is not in acute distress.    Appearance: She is well-developed.  HENT:     Head: Normocephalic and atraumatic.  Eyes:     Conjunctiva/sclera: Conjunctivae normal.  Cardiovascular:     Rate and Rhythm: Normal rate.  Pulmonary:     Effort: Pulmonary effort is normal.  Genitourinary:   Skin:    General: Skin is warm and dry.  Neurological:     Mental Status: She is alert and oriented to person, place, and time.  Psychiatric:        Mood and Affect: Mood normal.   Vitals:   01/26/21 1418  BP: (!) 151/71  Pulse: 70  Resp: 16  Weight: 185 lb (83.9 kg)  Height: 5\' 2"  (1.575 m)       Assessment & Plan:  65 year old female with ASCUS Pap smear and HPV positive.  Patient also has labial lesion for past 2 to 3 days. HSV culture. Sitz bath twice a day Decrease Clendening the area to once a day with soap.  Patient to use unscented Dove and lieu of frequent body washes. Colposcopy see note below   Colposcopy Procedure  Note  Indications: see above in HPI  Procedure Details  The risks and benefits of the procedure and Written informed consent obtained.  Speculum placed in vagina and excellent visualization of cervix achieved, cervix swabbed x 3 with acetic acid solution.  Findings: Cervix: no visible lesions; cervix swabbed with Lugol's solution and SCJ visualized 360 degrees without lesions. Vaginal inspection: vaginal colposcopy not performed. Vulvar colposcopy: vulvar colposcopy not performed.  Specimens: ECC  Complications: none.  Plan: Will base further treatment on Pathology findings.

## 2021-01-28 LAB — HERPES SIMPLEX VIRUS CULTURE
MICRO NUMBER:: 12604867
SPECIMEN QUALITY:: ADEQUATE

## 2021-01-28 LAB — SURGICAL PATHOLOGY

## 2021-02-02 ENCOUNTER — Telehealth: Payer: Self-pay | Admitting: *Deleted

## 2021-02-02 ENCOUNTER — Encounter: Payer: Self-pay | Admitting: *Deleted

## 2021-02-02 NOTE — Telephone Encounter (Signed)
I have sent a my chart message and a letter to patient informing her that she needs to call the office to schedule a LEEP.  Information concerning the LEEP also sent along with my chart message and letter mailed.

## 2021-02-02 NOTE — Telephone Encounter (Signed)
-----   Message from [name], MD sent at 01/29/2021  1:10 PM EST ----- I want to do a LEEP on Diane Brooks.  Has dysplasia in the ECC and I coulnd't see her transformation zone.  Can you print her out LEEP info or is there an emmi video?

## 2021-02-09 ENCOUNTER — Other Ambulatory Visit (HOSPITAL_COMMUNITY)
Admission: RE | Admit: 2021-02-09 | Discharge: 2021-02-09 | Disposition: A | Discharge status: Home / Self Care | Payer: 59 | Source: Ambulatory Visit | Attending: Obstetrics & Gynecology | Admitting: Obstetrics & Gynecology

## 2021-02-09 ENCOUNTER — Encounter: Payer: Self-pay | Admitting: Obstetrics & Gynecology

## 2021-02-09 ENCOUNTER — Ambulatory Visit: Payer: 59 | Admitting: Obstetrics & Gynecology

## 2021-02-09 ENCOUNTER — Other Ambulatory Visit: Payer: Self-pay

## 2021-02-09 VITALS — BP 141/78 | HR 81 | Resp 16 | Ht 62.0 in | Wt 185.0 lb

## 2021-02-09 DIAGNOSIS — R87810 Cervical high risk human papillomavirus (HPV) DNA test positive: Secondary | ICD-10-CM

## 2021-02-09 DIAGNOSIS — N879 Dysplasia of cervix uteri, unspecified: Secondary | ICD-10-CM | POA: Diagnosis present

## 2021-02-09 DIAGNOSIS — Z9889 Other specified postprocedural states: Secondary | ICD-10-CM

## 2021-02-09 NOTE — Progress Notes (Signed)
Colposcopy Procedure Note  Indications: Pap smear 1 months ago showed: ASCUS with POSITIVE high risk HPV. The prior pap showed ASCUS with POSITIVE high risk HPV.  Prior cervical/vaginal disease: normal exam without visible pathology. Prior cervical treatment: D&C.  Patient has unsatisfactory colpo for 2 years and a positive years, persistently positive high risk virus, dysplastic cells on ECC.  Procedure Details  The risks and benefits of the procedure and Written informed consent obtained.  Speculum placed in vagina and excellent visualization of cervix achieved, cervix swabbed x 3 with acetic acid solution.  Findings: Cervix: no visible lesions; cervix swabbed with Lugol's solution--no lesions with colpo Vaginal inspection: vaginal colposcopy not performed. Vulvar colposcopy: vulvar colposcopy not performed.  Specimens: LEEP  LEEP PROCEDURE NOTE Risks, benefits, alternatives, and limitations of procedure explained to patient, including pain, bleeding, infection, failure to remove abnormal tissue and failure to cure dysplasia, need for repeat procedures, damage to pelvic organs, cervical incompetence.  Role of HPV,cervical dysplasia and need for close followup was empasized. Informed written consent was obtained. All questions were answered. Time out performed.  ??Procedure: The patient was placed in lithotomy position and the bivalved coated speculum was placed in the patient's vagina. A grounding pad placed on the patient. Lugol's solution was applied to the cervix and areas of decreased uptake were not noted.   Local anesthesia was administered via an intracervical block using 10cc of 2% Lidocaine with epinephrine. The suction was turned on and the Small 1X Fisher Cone Biopsy Excisor on 85 Watts of cutting current was used to excise the area of decreased uptake and excise the entire transformation zone. Excellent hemostasis was achieved using roller ball coagulation set at 50 Watts coagulation  current. Monsel's solution was then applied and the speculum was removed from the vagina. Specimens were sent to pathology. ?The patient tolerated the procedure well. Post-operative instructions given to patient, including instruction to seek medical attention for persistent bright red bleeding, fever, abdominal/pelvic pain, dysuria, nausea or vomiting. She was also told about the possibility of having copious yellow to black tinged discharge. She was counseled to avoid anything in the vagina (sex/douching/tampons) for 4 weeks. She has a  2 week post-operative check to review results and assess wound healing. Follow up in 4 months for repeat pap or as needed.

## 2021-02-13 LAB — SURGICAL PATHOLOGY

## 2021-02-24 ENCOUNTER — Telehealth: Payer: Self-pay

## 2021-02-24 NOTE — Telephone Encounter (Addendum)
Spoke with pt and she is aware that LEEP did not show dysplasia. Pt was told to schedule pap smear every year. Pt reminded of follow up appt on 12/19 and pt states she is coming to appt.   ----- Message from [name], MD sent at 02/23/2021  4:27 PM EST ----- LEEP did not show dysplsia.  Will have pathology review slides and continue to do yearly paps/HPV testing.  Please call patient with resutl.

## 2021-03-09 ENCOUNTER — Encounter: Payer: Self-pay | Admitting: Obstetrics & Gynecology

## 2021-03-09 ENCOUNTER — Ambulatory Visit (INDEPENDENT_AMBULATORY_CARE_PROVIDER_SITE_OTHER): Payer: 59 | Admitting: Obstetrics & Gynecology

## 2021-03-09 ENCOUNTER — Other Ambulatory Visit: Payer: Self-pay

## 2021-03-09 VITALS — BP 127/72 | HR 63 | Resp 16 | Ht 62.0 in | Wt 184.0 lb

## 2021-03-09 DIAGNOSIS — N72 Inflammatory disease of cervix uteri: Secondary | ICD-10-CM

## 2021-03-09 MED ORDER — DOXYCYCLINE HYCLATE 100 MG PO CAPS: 100.0000 mg | ORAL_CAPSULE | Freq: Two times a day (BID) | ORAL | 0 refills | Status: DC

## 2021-03-09 MED ORDER — FLUCONAZOLE 150 MG PO TABS: 150.0000 mg | ORAL_TABLET | Freq: Once | ORAL | 0 refills | Status: AC

## 2021-03-09 NOTE — Progress Notes (Signed)
° °  Subjective:    Patient ID: Diane Brooks, female    DOB: [birth year], 65 y.o.   MRN: [id number]  HPI  Diane Brooks is a 65 year old female who presents status post LEEP.  Pathology was reviewed with the patient via MyChart and over the phone.  There was no dysplasia in the LEEP.  Will have pathology pull slides and review  Review of Systems  Constitutional: Negative.   Respiratory: Negative.    Cardiovascular: Negative.   Gastrointestinal: Negative.   Genitourinary: Negative.       Objective:   Physical Exam Vitals reviewed.  Constitutional:      General: She is not in acute distress.    Appearance: She is well-developed.  HENT:     Head: Normocephalic and atraumatic.  Eyes:     Conjunctiva/sclera: Conjunctivae normal.  Cardiovascular:     Rate and Rhythm: Normal rate.  Pulmonary:     Effort: Pulmonary effort is normal.  Genitourinary:    Comments: LEEP healing although not as fast as I would expect.  Possible low grade infection with mucoid discharge noted at os.  Skin:    General: Skin is warm and dry.  Neurological:     Mental Status: She is alert and oriented to person, place, and time.  Psychiatric:        Mood and Affect: Mood normal.   Vitals:   03/09/21 1402  BP: 127/72  Pulse: 63  Resp: 16  Weight: 184 lb (83.5 kg)  Height: 5\' 2"  (1.575 m)     Assessment & Plan:  65 yo female s/p LEEP for persistent abnormal pap smears, inadequate colpo and + ECC.   Path to review all slides Treat wit doxy for 1 week for low grade infection post LEEP.

## 2021-03-13 ENCOUNTER — Other Ambulatory Visit: Payer: Self-pay

## 2021-03-13 ENCOUNTER — Encounter: Payer: Self-pay | Admitting: Physician Assistant

## 2021-03-13 ENCOUNTER — Ambulatory Visit (INDEPENDENT_AMBULATORY_CARE_PROVIDER_SITE_OTHER): Payer: 59 | Admitting: Physician Assistant

## 2021-03-13 VITALS — BP 114/80 | HR 65 | Temp 98.0°F | Ht 62.0 in | Wt 186.0 lb

## 2021-03-13 DIAGNOSIS — F411 Generalized anxiety disorder: Secondary | ICD-10-CM | POA: Diagnosis not present

## 2021-03-13 DIAGNOSIS — I10 Essential (primary) hypertension: Secondary | ICD-10-CM | POA: Diagnosis not present

## 2021-03-13 DIAGNOSIS — K219 Gastro-esophageal reflux disease without esophagitis: Secondary | ICD-10-CM

## 2021-03-13 DIAGNOSIS — G629 Polyneuropathy, unspecified: Secondary | ICD-10-CM

## 2021-03-13 DIAGNOSIS — E1159 Type 2 diabetes mellitus with other circulatory complications: Secondary | ICD-10-CM

## 2021-03-13 DIAGNOSIS — K224 Dyskinesia of esophagus: Secondary | ICD-10-CM

## 2021-03-13 DIAGNOSIS — B351 Tinea unguium: Secondary | ICD-10-CM

## 2021-03-13 DIAGNOSIS — E1142 Type 2 diabetes mellitus with diabetic polyneuropathy: Secondary | ICD-10-CM

## 2021-03-13 DIAGNOSIS — I152 Hypertension secondary to endocrine disorders: Secondary | ICD-10-CM

## 2021-03-13 DIAGNOSIS — M7918 Myalgia, other site: Secondary | ICD-10-CM

## 2021-03-13 MED ORDER — HYDROCHLOROTHIAZIDE 25 MG PO TABS: 25.0000 mg | ORAL_TABLET | Freq: Every day | ORAL | 1 refills | Status: AC

## 2021-03-13 MED ORDER — ATENOLOL 25 MG PO TABS: 25.0000 mg | ORAL_TABLET | Freq: Every day | ORAL | 1 refills | Status: AC

## 2021-03-13 MED ORDER — PANTOPRAZOLE SODIUM 40 MG PO TBEC: 40.0000 mg | DELAYED_RELEASE_TABLET | Freq: Every day | ORAL | 1 refills | Status: AC

## 2021-03-13 MED ORDER — GABAPENTIN 300 MG PO CAPS: ORAL_CAPSULE | ORAL | 1 refills | Status: AC

## 2021-03-13 MED ORDER — DULOXETINE HCL 60 MG PO CPEP: 60.0000 mg | ORAL_CAPSULE | Freq: Every day | ORAL | 1 refills | Status: AC

## 2021-03-13 MED ORDER — DAPAGLIFLOZIN PROPANEDIOL 10 MG PO TABS: 10.0000 mg | ORAL_TABLET | Freq: Every day | ORAL | 1 refills | Status: AC

## 2021-03-13 MED ORDER — BUSPIRONE HCL 5 MG PO TABS: 5.0000 mg | ORAL_TABLET | Freq: Three times a day (TID) | ORAL | 1 refills | Status: AC

## 2021-03-13 MED ORDER — TERBINAFINE HCL 250 MG PO TABS: 250.0000 mg | ORAL_TABLET | Freq: Every day | ORAL | 1 refills | Status: AC

## 2021-03-13 MED ORDER — SITAGLIPTIN PHOSPHATE 100 MG PO TABS: 100.0000 mg | ORAL_TABLET | Freq: Every day | ORAL | 1 refills | Status: AC

## 2021-03-13 MED ORDER — LOSARTAN POTASSIUM 100 MG PO TABS: 100.0000 mg | ORAL_TABLET | Freq: Every day | ORAL | 1 refills | Status: AC

## 2021-03-13 MED ORDER — GLIPIZIDE ER 2.5 MG PO TB24: 2.5000 mg | ORAL_TABLET | Freq: Every day | ORAL | 1 refills | Status: AC

## 2021-03-13 NOTE — Patient Instructions (Addendum)
Eucerin for feet

## 2021-03-13 NOTE — Progress Notes (Signed)
Subjective:    Patient ID: Diane Brooks, female    DOB: 15-Jan-1956, 65 y.o.   MRN: [id number]  HPI Pt is a 65 yo obese female with HTN, Migraines, T2DM, GERD, myofascial pain who presents to the clinic to follow up one more time this year before her insurance changes. Our office is not in network for her plan next year.   Overall she is doing well with no concerns. She does request 90 day rx for medications.   .. Active Ambulatory Problems    Diagnosis Date Noted   Hyperlipidemia 03/28/2013   Hiatal hernia 03/28/2013   Essential hypertension, benign 03/28/2013   Diabetes mellitus type II, controlled ([place]) 03/28/2013   Gastroesophageal reflux disease 05/10/2013   Globus sensation 05/10/2013   Internal hemorrhoids 05/10/2013   Decreased libido 05/10/2013   Dysplasia of cervix, low grade (CIN 1) 05/24/2013   Colon cancer screening 05/25/2013   Obesity (BMI 30-39.9) 06/27/2013   Diabetes mellitus with microalbuminuria ([place]) 06/27/2013   Hyperlipemia 06/27/2013   Cyst, eyelid sebaceous 06/27/2013   Onychomycosis 06/27/2013   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   Sudoriferous cyst 07/17/2013   Seasonal allergies 08/03/2013   Allergic rhinitis 08/03/2013   Tinea versicolor 08/03/2013   Class 1 obesity due to excess calories with serious comorbidity and body mass index (BMI) of 33.0 to 33.9 in adult 10/29/2013   Abdominal pain, epigastric 11/09/2013   Migraine headache without aura 10/16/2015   De Quervain's disease (radial styloid tenosynovitis) 11/14/2015   BPV (benign positional vertigo) 11/14/2015   Polyarthralgia 11/19/2015   Mouth sores 12/15/2015   Light stools 62/56/3893   Systolic murmur 73/42/8768   Low serum vitamin B12 04/20/2017   Memory changes 04/24/2017   Itching 06/13/2017   Lipoma of left lower extremity 06/13/2017   Reactive airway disease 07/03/2017   Shortness of breath 07/07/2017   Myofascial pain syndrome 07/14/2017   Vitamin D deficiency 09/12/2017    Microalbuminuria 09/13/2017   Hypertension associated with diabetes ([place]) 03/07/2018   Blepharospasm 06/13/2018   Anxiety as acute reaction to exceptional stress 09/07/2018   Facet arthritis, degenerative, lumbar spine 04/16/2019   Spondylosis without myelopathy or radiculopathy, cervical region 04/16/2019   Left lumbar radiculitis 04/27/2019   Radiculitis of left cervical region 04/27/2019   Urinary incontinence 05/18/2019   Protrusion of lumbar intervertebral disc 05/21/2019   DDD (degenerative disc disease), lumbar 05/21/2019   Trochanteric bursitis, left hip 05/29/2019   Hyperlipidemia associated with type 2 diabetes mellitus ([place]) 12/11/2019   Acute midline thoracic back pain 12/11/2019   Chronic pain 01/16/2020   Moderate episode of recurrent major depressive disorder ([place]) 01/22/2020   Neuropathy 03/31/2020   Sinus pressure 05/26/2020   Tongue lesion 06/29/2020   Dyshidrotic eczema 07/07/2020   Bloating 08/27/2020   Loose stools 08/27/2020   Medication side effect 10/24/2020   Perioral numbness 12/22/2020   Jaw pain 12/22/2020   Resolved Ambulatory Problems    Diagnosis Date Noted   Atypical chest pain 05/10/2013   Abnormal weight gain 06/27/2013   Hot flashes 10/29/2013   Mid back pain 10/24/2015   Left-sided low back pain with left-sided sciatica 10/24/2015   Neck pain 10/24/2015   Muscle spasm of back 11/14/2015   Black stools 12/15/2015   Anxiety 06/13/2017   Persistent cough for 3 weeks or longer 07/03/2017   Visit for screening mammogram 09/12/2017   Chronic left-sided low back pain without sciatica 03/09/2018   DDD (degenerative disc disease), cervical 04/16/2019   Past Medical History:  Diagnosis Date   Diabetes mellitus without complication ([place])    Hypertension        Review of Systems  All other systems reviewed and are negative.     Objective:   Physical Exam Vitals reviewed.  Constitutional:      Appearance: Normal appearance. She is  obese.  HENT:     Head: Normocephalic.  Cardiovascular:     Rate and Rhythm: Normal rate and regular rhythm.     Pulses: Normal pulses.     Heart sounds: Murmur heard.  Pulmonary:     Effort: Pulmonary effort is normal.     Breath sounds: Normal breath sounds.  Musculoskeletal:     Right lower leg: No edema.     Left lower leg: No edema.  Lymphadenopathy:     Cervical: No cervical adenopathy.  Neurological:     General: No focal deficit present.     Mental Status: She is alert and oriented to person, place, and time.  Psychiatric:        Mood and Affect: Mood normal.      Lab Results  Component Value Date   HGBA1C 6.3 (H) 12/22/2020       Assessment & Plan:  [name] KitchenMarland KitchenCheryle was seen today for follow-up.  Diagnoses and all orders for this visit:  Essential hypertension, benign -     hydrochlorothiazide (HYDRODIURIL) 25 MG tablet; Take 1 tablet (25 mg total) by mouth daily. -     atenolol (TENORMIN) 25 MG tablet; Take 1 tablet (25 mg total) by mouth daily.  Controlled type 2 diabetes mellitus with diabetic polyneuropathy, without long-term current use of insulin (HCC) -     sitaGLIPtin (JANUVIA) 100 MG tablet; Take 1 tablet (100 mg total) by mouth daily. -     dapagliflozin propanediol (FARXIGA) 10 MG TABS tablet; Take 1 tablet (10 mg total) by mouth daily before breakfast. -     glipiZIDE (GLUCOTROL XL) 2.5 MG 24 hr tablet; Take 1 tablet (2.5 mg total) by mouth daily with breakfast.  GAD (generalized anxiety disorder) -     busPIRone (BUSPAR) 5 MG tablet; Take 1 tablet (5 mg total) by mouth 3 (three) times daily.  Gastroesophageal reflux disease without esophagitis -     pantoprazole (PROTONIX) 40 MG tablet; Take 1 tablet (40 mg total) by mouth daily.  Nutcracker esophagus -     pantoprazole (PROTONIX) 40 MG tablet; Take 1 tablet (40 mg total) by mouth daily.  Hypertension associated with diabetes ([place]) -     losartan (COZAAR) 100 MG tablet; Take 1 tablet (100 mg total)  by mouth daily.  Myofascial pain syndrome -     DULoxetine (CYMBALTA) 60 MG capsule; Take 1 capsule (60 mg total) by mouth daily. -     gabapentin (NEURONTIN) 300 MG capsule; One tab PO qHS for a week, then BID for a week, then TID. May double weekly to a max of 3,600mg /day  Neuropathy -     gabapentin (NEURONTIN) 300 MG capsule; One tab PO qHS for a week, then BID for a week, then TID. May double weekly to a max of 3,600mg /day  Other orders -     terbinafine (LAMISIL) 250 MG tablet; Take 1 tablet (250 mg total) by mouth daily.   a1C to goal.  Continue same medications. BP to goal. Continue on ARB.  On STATIN. LDL to goal.  Eye exam scheduled for next week.  Foot exam UTD.  Needs shingles shot dose 2.  Flu  shot UTD.  Pneumonia shot UtD.  Covid vaccine UTD.   Continue on cymbalta and gabapentin for chronic pain.   Follow up as needed.

## 2021-03-17 ENCOUNTER — Encounter: Payer: Self-pay | Admitting: Physician Assistant

## 2021-03-20 ENCOUNTER — Other Ambulatory Visit: Payer: Self-pay

## 2021-03-20 MED ORDER — BUTALBITAL-APAP-CAFFEINE 50-325-40 MG PO TABS: 1.0000 | ORAL_TABLET | Freq: Four times a day (QID) | ORAL | 0 refills | Status: AC | PRN

## 2021-03-20 NOTE — Telephone Encounter (Signed)
She needs this medication today because she will have a new insurance next year and they do not cover this medication.

## 2021-03-24 ENCOUNTER — Ambulatory Visit: Payer: 59 | Admitting: Physician Assistant

## 2021-12-02 ENCOUNTER — Other Ambulatory Visit: Payer: Self-pay | Admitting: Physician Assistant

## 2021-12-02 DIAGNOSIS — E119 Type 2 diabetes mellitus without complications: Secondary | ICD-10-CM

## 2022-08-05 IMAGING — MG DIGITAL SCREENING BREAST BILAT IMPLANT W/ TOMO W/ CAD
8 of 12 series · 8 of 28 positions shown · non-contrast
Comparison: Previous exam(s).

CLINICAL DATA: Screening.

EXAM:
DIGITAL SCREENING BILATERAL MAMMOGRAM WITH IMPLANTS, CAD AND TOMO
The patient has retropectoral implants. Standard and implant
displaced views were performed.

[L CC]
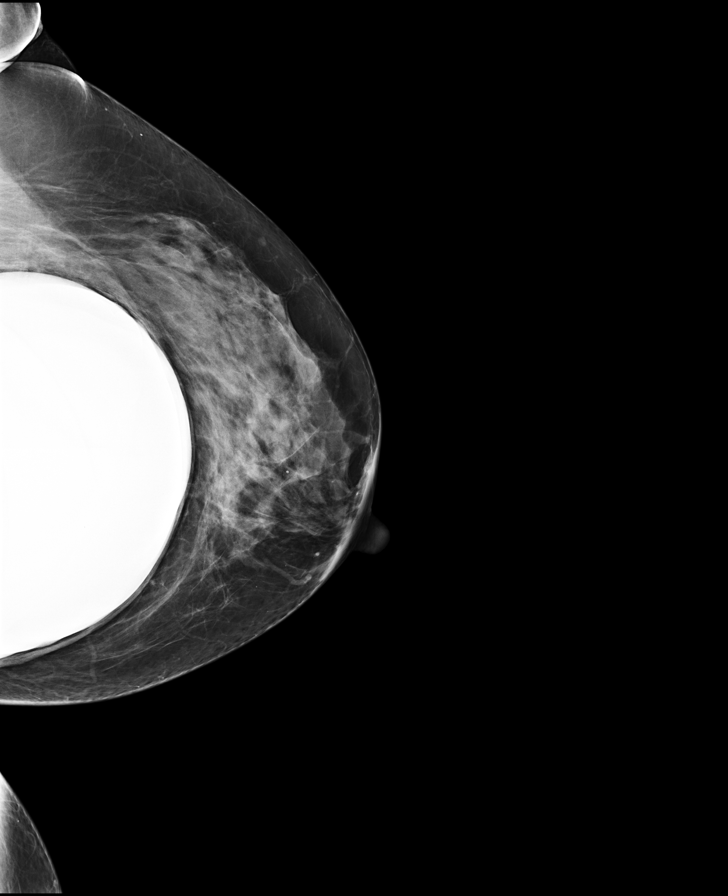

[R MLO]
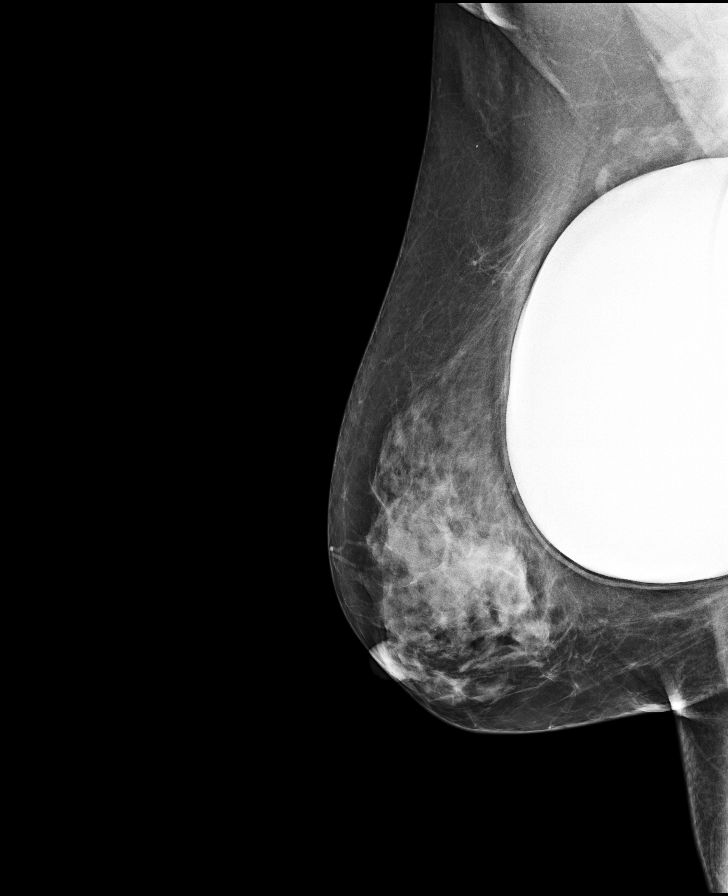

[R CC]
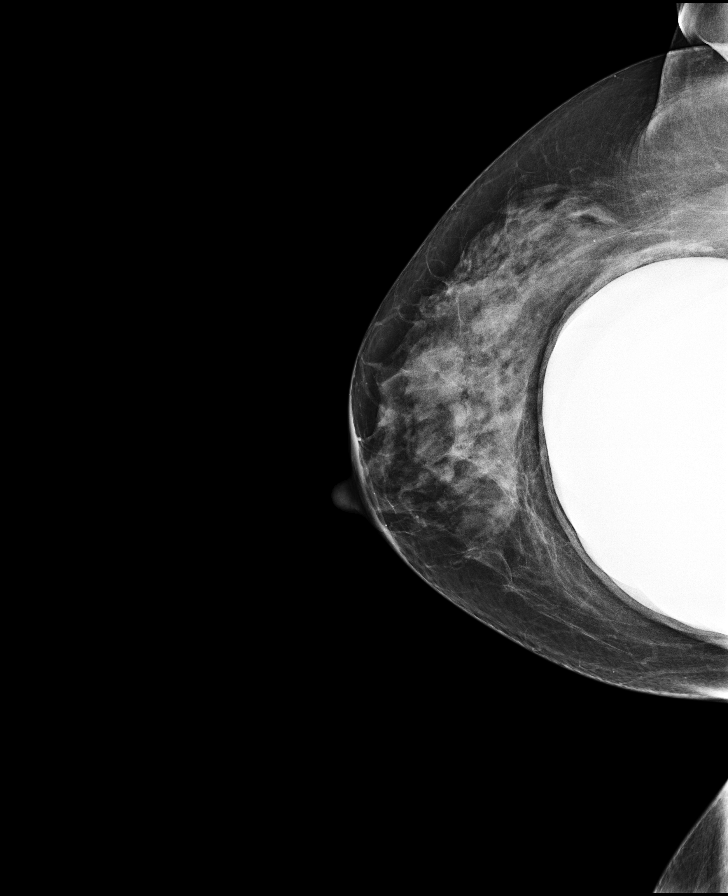

[L MLO]
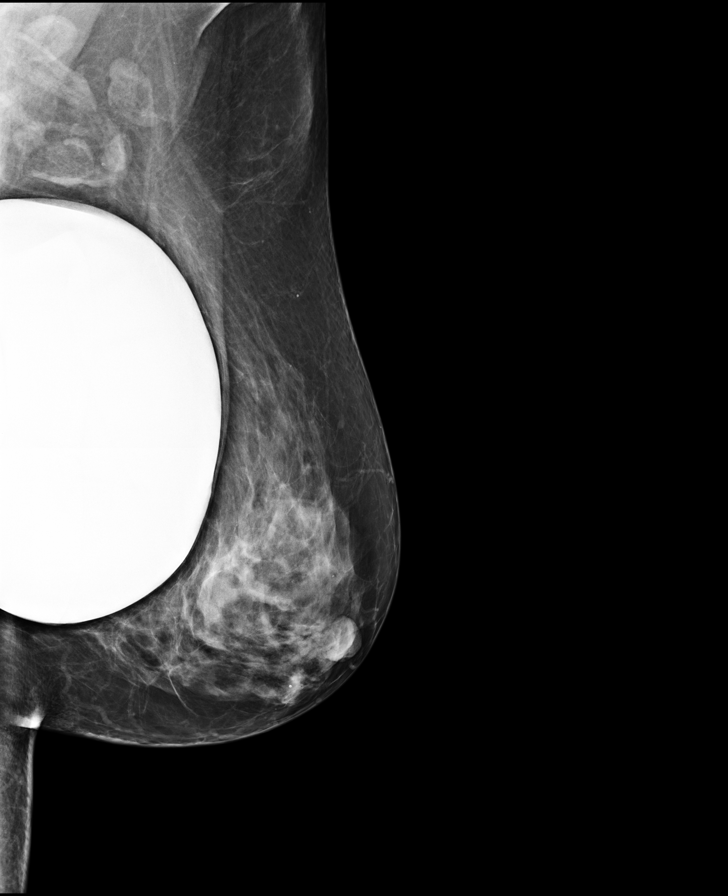

[L CC synth-2D]
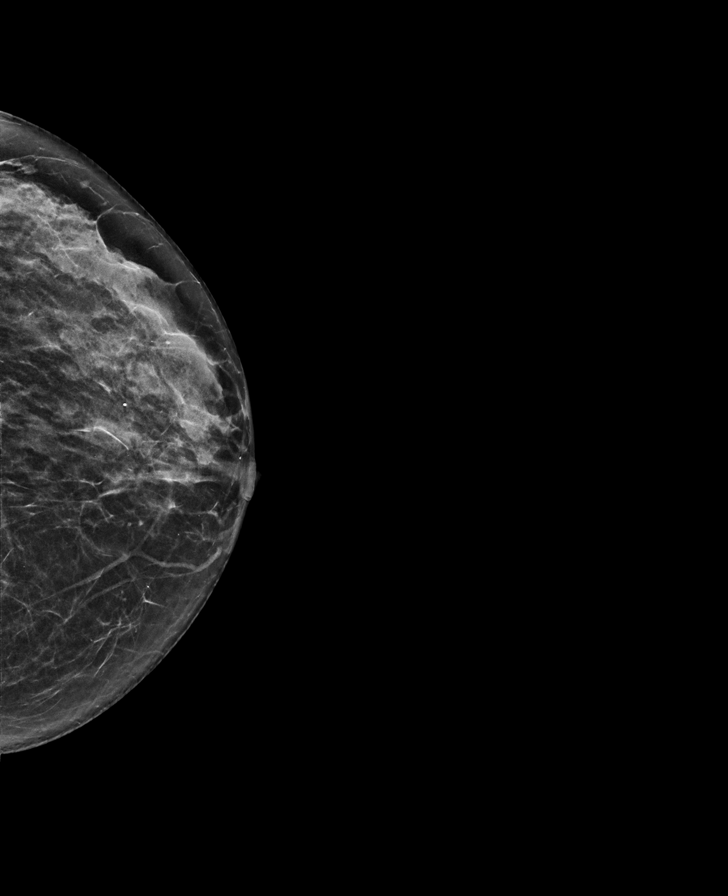

[R MLO synth-2D]
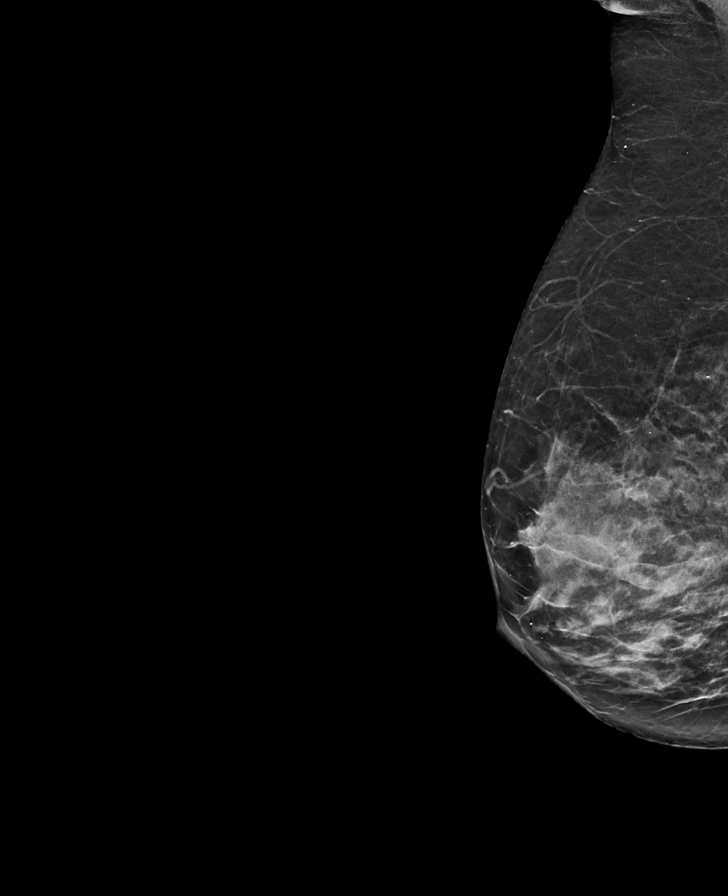

[L MLO synth-2D]
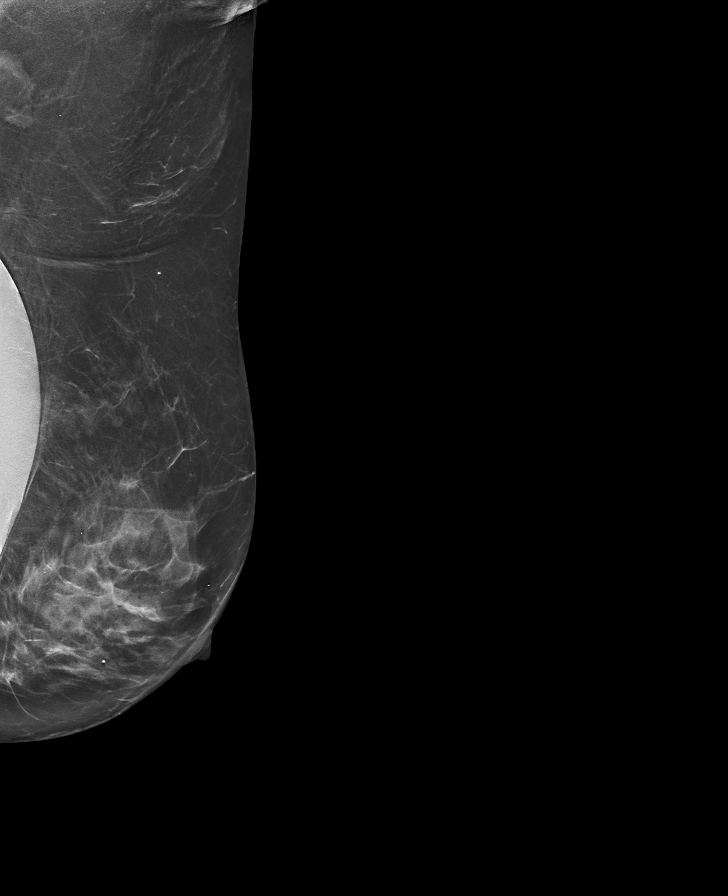

[R CC synth-2D]
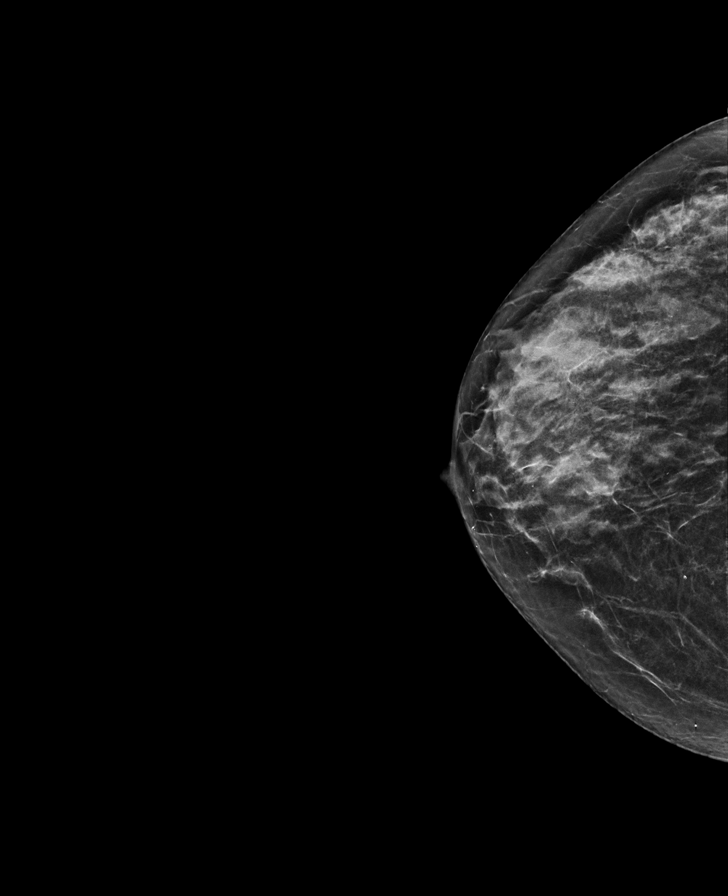

[8 of 28 positions shown; findings below may reference images not displayed]

ACR Breast Density Category c: The breast tissue is heterogeneously
dense, which may obscure small masses.
FINDINGS: There are no findings suspicious for malignancy. Images were
processed with CAD.
IMPRESSION: No mammographic evidence of malignancy. A result letter of this
screening mammogram will be mailed directly to the patient.

RECOMMENDATION:
Screening mammogram in one year. (Code:49-X-OQ9)

BI-RADS CATEGORY  1:  Negative.
